# Patient Record
Sex: Female | Born: 1935 | Race: Black or African American | Hispanic: No | Smoking: Never smoker
Health system: Southern US, Community
[De-identification: ages and names within clinical notes are randomized; demographics above are authoritative.]

## PROBLEM LIST (undated history)

## (undated) DIAGNOSIS — I4821 Permanent atrial fibrillation: Secondary | ICD-10-CM

## (undated) DIAGNOSIS — M069 Rheumatoid arthritis, unspecified: Secondary | ICD-10-CM

## (undated) DIAGNOSIS — D62 Acute posthemorrhagic anemia: Secondary | ICD-10-CM

## (undated) DIAGNOSIS — I272 Pulmonary hypertension, unspecified: Secondary | ICD-10-CM

## (undated) DIAGNOSIS — I10 Essential (primary) hypertension: Secondary | ICD-10-CM

## (undated) DIAGNOSIS — E785 Hyperlipidemia, unspecified: Secondary | ICD-10-CM

## (undated) DIAGNOSIS — I5032 Chronic diastolic (congestive) heart failure: Secondary | ICD-10-CM

## (undated) HISTORY — DX: Essential (primary) hypertension: I10

## (undated) HISTORY — DX: Acute posthemorrhagic anemia: D62

## (undated) HISTORY — DX: Pulmonary hypertension, unspecified: I27.20

## (undated) HISTORY — DX: Permanent atrial fibrillation: I48.21

## (undated) HISTORY — DX: Chronic diastolic (congestive) heart failure: I50.32

## (undated) HISTORY — PX: NO PAST SURGERIES: SHX2092

## (undated) HISTORY — DX: Hyperlipidemia, unspecified: E78.5

---

## 1997-06-08 ENCOUNTER — Encounter: Admission: RE | Admit: 1997-06-08 | Discharge: 1997-06-08 | Payer: Self-pay | Admitting: Family Medicine

## 1997-07-06 ENCOUNTER — Encounter: Admission: RE | Admit: 1997-07-06 | Discharge: 1997-07-06 | Payer: Self-pay | Admitting: Family Medicine

## 1997-07-25 ENCOUNTER — Encounter: Admission: RE | Admit: 1997-07-25 | Discharge: 1997-07-25 | Payer: Self-pay | Admitting: Family Medicine

## 1997-08-17 ENCOUNTER — Encounter: Admission: RE | Admit: 1997-08-17 | Discharge: 1997-08-17 | Payer: Self-pay | Admitting: Family Medicine

## 1997-09-21 ENCOUNTER — Encounter: Admission: RE | Admit: 1997-09-21 | Discharge: 1997-09-21 | Payer: Self-pay | Admitting: Family Medicine

## 1997-10-26 ENCOUNTER — Encounter: Admission: RE | Admit: 1997-10-26 | Discharge: 1997-10-26 | Payer: Self-pay | Admitting: Family Medicine

## 1997-12-30 ENCOUNTER — Encounter: Admission: RE | Admit: 1997-12-30 | Discharge: 1997-12-30 | Payer: Self-pay | Admitting: Family Medicine

## 1998-01-02 ENCOUNTER — Encounter: Admission: RE | Admit: 1998-01-02 | Discharge: 1998-01-02 | Payer: Self-pay | Admitting: Family Medicine

## 1998-01-02 ENCOUNTER — Ambulatory Visit (HOSPITAL_COMMUNITY): Admission: RE | Admit: 1998-01-02 | Discharge: 1998-01-02 | Payer: Self-pay

## 1998-01-05 ENCOUNTER — Encounter: Admission: RE | Admit: 1998-01-05 | Discharge: 1998-01-05 | Payer: Self-pay | Admitting: Family Medicine

## 1998-01-11 ENCOUNTER — Encounter: Admission: RE | Admit: 1998-01-11 | Discharge: 1998-01-11 | Payer: Self-pay | Admitting: Family Medicine

## 1998-01-18 ENCOUNTER — Encounter: Admission: RE | Admit: 1998-01-18 | Discharge: 1998-01-18 | Payer: Self-pay | Admitting: Family Medicine

## 1998-01-25 ENCOUNTER — Encounter: Admission: RE | Admit: 1998-01-25 | Discharge: 1998-01-25 | Payer: Self-pay | Admitting: Family Medicine

## 1998-02-02 ENCOUNTER — Encounter: Admission: RE | Admit: 1998-02-02 | Discharge: 1998-02-02 | Payer: Self-pay | Admitting: Family Medicine

## 1998-02-08 ENCOUNTER — Encounter: Admission: RE | Admit: 1998-02-08 | Discharge: 1998-02-08 | Payer: Self-pay | Admitting: Sports Medicine

## 1998-02-24 ENCOUNTER — Encounter: Admission: RE | Admit: 1998-02-24 | Discharge: 1998-02-24 | Payer: Self-pay | Admitting: Sports Medicine

## 1998-03-01 ENCOUNTER — Encounter: Admission: RE | Admit: 1998-03-01 | Discharge: 1998-03-01 | Payer: Self-pay | Admitting: Family Medicine

## 1998-03-10 ENCOUNTER — Encounter: Admission: RE | Admit: 1998-03-10 | Discharge: 1998-03-10 | Payer: Self-pay | Admitting: Family Medicine

## 1998-03-24 ENCOUNTER — Encounter: Admission: RE | Admit: 1998-03-24 | Discharge: 1998-03-24 | Payer: Self-pay | Admitting: Family Medicine

## 1998-04-07 ENCOUNTER — Encounter: Admission: RE | Admit: 1998-04-07 | Discharge: 1998-04-07 | Payer: Self-pay | Admitting: Family Medicine

## 1998-04-19 ENCOUNTER — Encounter: Admission: RE | Admit: 1998-04-19 | Discharge: 1998-04-19 | Payer: Self-pay | Admitting: Family Medicine

## 1998-05-02 ENCOUNTER — Encounter: Admission: RE | Admit: 1998-05-02 | Discharge: 1998-05-02 | Payer: Self-pay | Admitting: Sports Medicine

## 1998-05-17 ENCOUNTER — Encounter: Admission: RE | Admit: 1998-05-17 | Discharge: 1998-05-17 | Payer: Self-pay | Admitting: Family Medicine

## 1998-06-08 ENCOUNTER — Encounter: Admission: RE | Admit: 1998-06-08 | Discharge: 1998-06-08 | Payer: Self-pay | Admitting: Family Medicine

## 1998-06-14 ENCOUNTER — Encounter: Admission: RE | Admit: 1998-06-14 | Discharge: 1998-06-14 | Payer: Self-pay | Admitting: Family Medicine

## 1998-06-26 ENCOUNTER — Encounter: Admission: RE | Admit: 1998-06-26 | Discharge: 1998-06-26 | Payer: Self-pay | Admitting: Family Medicine

## 1998-07-03 ENCOUNTER — Encounter: Admission: RE | Admit: 1998-07-03 | Discharge: 1998-07-03 | Payer: Self-pay | Admitting: Family Medicine

## 1998-09-04 ENCOUNTER — Encounter: Admission: RE | Admit: 1998-09-04 | Discharge: 1998-09-04 | Payer: Self-pay | Admitting: Family Medicine

## 1998-09-15 ENCOUNTER — Encounter: Admission: RE | Admit: 1998-09-15 | Discharge: 1998-09-15 | Payer: Self-pay | Admitting: Family Medicine

## 1998-10-24 ENCOUNTER — Encounter: Admission: RE | Admit: 1998-10-24 | Discharge: 1998-10-24 | Payer: Self-pay | Admitting: Sports Medicine

## 1999-02-06 ENCOUNTER — Encounter: Admission: RE | Admit: 1999-02-06 | Discharge: 1999-02-06 | Payer: Self-pay | Admitting: Sports Medicine

## 1999-03-08 ENCOUNTER — Encounter: Admission: RE | Admit: 1999-03-08 | Discharge: 1999-03-08 | Payer: Self-pay | Admitting: Family Medicine

## 1999-04-04 ENCOUNTER — Encounter: Admission: RE | Admit: 1999-04-04 | Discharge: 1999-04-04 | Payer: Self-pay | Admitting: Family Medicine

## 1999-08-28 ENCOUNTER — Encounter: Admission: RE | Admit: 1999-08-28 | Discharge: 1999-08-28 | Payer: Self-pay | Admitting: Sports Medicine

## 1999-09-13 ENCOUNTER — Other Ambulatory Visit: Admission: RE | Admit: 1999-09-13 | Discharge: 1999-09-13 | Payer: Self-pay | Admitting: Sports Medicine

## 2000-01-15 ENCOUNTER — Encounter: Admission: RE | Admit: 2000-01-15 | Discharge: 2000-01-15 | Payer: Self-pay | Admitting: Family Medicine

## 2000-02-06 ENCOUNTER — Encounter: Admission: RE | Admit: 2000-02-06 | Discharge: 2000-02-06 | Payer: Self-pay | Admitting: Family Medicine

## 2000-04-10 ENCOUNTER — Inpatient Hospital Stay (HOSPITAL_COMMUNITY): Admission: EM | Admit: 2000-04-10 | Discharge: 2000-04-12 | Payer: Self-pay | Admitting: Emergency Medicine

## 2000-04-10 ENCOUNTER — Encounter: Payer: Self-pay | Admitting: Internal Medicine

## 2000-06-17 ENCOUNTER — Encounter: Admission: RE | Admit: 2000-06-17 | Discharge: 2000-06-17 | Payer: Self-pay | Admitting: Family Medicine

## 2000-09-08 ENCOUNTER — Encounter: Admission: RE | Admit: 2000-09-08 | Discharge: 2000-09-08 | Payer: Self-pay | Admitting: Family Medicine

## 2000-10-07 ENCOUNTER — Encounter: Admission: RE | Admit: 2000-10-07 | Discharge: 2000-10-07 | Payer: Self-pay | Admitting: Family Medicine

## 2001-02-26 ENCOUNTER — Encounter: Admission: RE | Admit: 2001-02-26 | Discharge: 2001-02-26 | Payer: Self-pay | Admitting: Family Medicine

## 2001-06-18 ENCOUNTER — Encounter (INDEPENDENT_AMBULATORY_CARE_PROVIDER_SITE_OTHER): Payer: Self-pay | Admitting: *Deleted

## 2001-06-22 ENCOUNTER — Encounter: Admission: RE | Admit: 2001-06-22 | Discharge: 2001-06-22 | Payer: Self-pay | Admitting: Family Medicine

## 2001-11-04 ENCOUNTER — Encounter: Admission: RE | Admit: 2001-11-04 | Discharge: 2001-11-04 | Payer: Self-pay | Admitting: Family Medicine

## 2001-12-07 ENCOUNTER — Encounter: Admission: RE | Admit: 2001-12-07 | Discharge: 2001-12-07 | Payer: Self-pay | Admitting: Family Medicine

## 2001-12-17 ENCOUNTER — Encounter: Admission: RE | Admit: 2001-12-17 | Discharge: 2001-12-17 | Payer: Self-pay | Admitting: Family Medicine

## 2002-01-11 ENCOUNTER — Encounter: Admission: RE | Admit: 2002-01-11 | Discharge: 2002-04-11 | Payer: Self-pay | Admitting: Family Medicine

## 2002-01-27 ENCOUNTER — Encounter: Payer: Self-pay | Admitting: Family Medicine

## 2002-01-27 ENCOUNTER — Ambulatory Visit (HOSPITAL_COMMUNITY): Admission: RE | Admit: 2002-01-27 | Discharge: 2002-01-27 | Payer: Self-pay | Admitting: Family Medicine

## 2002-02-27 ENCOUNTER — Emergency Department (HOSPITAL_COMMUNITY): Admission: EM | Admit: 2002-02-27 | Discharge: 2002-02-27 | Payer: Self-pay | Admitting: Emergency Medicine

## 2002-03-04 ENCOUNTER — Encounter: Admission: RE | Admit: 2002-03-04 | Discharge: 2002-03-04 | Payer: Self-pay | Admitting: Family Medicine

## 2002-05-21 ENCOUNTER — Encounter: Admission: RE | Admit: 2002-05-21 | Discharge: 2002-05-21 | Payer: Self-pay | Admitting: Family Medicine

## 2002-07-09 ENCOUNTER — Encounter: Admission: RE | Admit: 2002-07-09 | Discharge: 2002-07-09 | Payer: Self-pay | Admitting: Family Medicine

## 2002-09-06 ENCOUNTER — Encounter: Admission: RE | Admit: 2002-09-06 | Discharge: 2002-09-06 | Payer: Self-pay | Admitting: Sports Medicine

## 2003-01-05 ENCOUNTER — Encounter: Admission: RE | Admit: 2003-01-05 | Discharge: 2003-01-05 | Payer: Self-pay | Admitting: Family Medicine

## 2003-01-12 ENCOUNTER — Encounter: Admission: RE | Admit: 2003-01-12 | Discharge: 2003-01-12 | Payer: Self-pay | Admitting: Family Medicine

## 2003-04-28 ENCOUNTER — Encounter: Admission: RE | Admit: 2003-04-28 | Discharge: 2003-04-28 | Payer: Self-pay | Admitting: Family Medicine

## 2003-05-02 ENCOUNTER — Encounter: Admission: RE | Admit: 2003-05-02 | Discharge: 2003-05-02 | Payer: Self-pay | Admitting: Sports Medicine

## 2003-07-01 ENCOUNTER — Encounter: Admission: RE | Admit: 2003-07-01 | Discharge: 2003-07-01 | Payer: Self-pay | Admitting: Family Medicine

## 2003-11-18 ENCOUNTER — Ambulatory Visit: Payer: Self-pay | Admitting: Family Medicine

## 2003-12-21 ENCOUNTER — Ambulatory Visit: Payer: Self-pay | Admitting: Cardiology

## 2004-01-18 ENCOUNTER — Ambulatory Visit: Payer: Self-pay | Admitting: Cardiology

## 2004-01-24 ENCOUNTER — Ambulatory Visit: Payer: Self-pay | Admitting: Family Medicine

## 2004-02-15 ENCOUNTER — Ambulatory Visit: Payer: Self-pay | Admitting: Internal Medicine

## 2004-03-14 ENCOUNTER — Ambulatory Visit: Payer: Self-pay | Admitting: Cardiology

## 2004-03-15 ENCOUNTER — Ambulatory Visit: Payer: Self-pay | Admitting: Sports Medicine

## 2004-04-04 ENCOUNTER — Ambulatory Visit: Payer: Self-pay | Admitting: Cardiovascular Disease

## 2004-04-10 ENCOUNTER — Ambulatory Visit: Payer: Self-pay | Admitting: Family Medicine

## 2004-04-25 ENCOUNTER — Ambulatory Visit: Payer: Self-pay | Admitting: Cardiovascular Disease

## 2004-05-02 ENCOUNTER — Ambulatory Visit: Payer: Self-pay | Admitting: Family Medicine

## 2004-05-16 ENCOUNTER — Ambulatory Visit: Payer: Self-pay | Admitting: Cardiology

## 2004-05-16 ENCOUNTER — Ambulatory Visit: Payer: Self-pay | Admitting: *Deleted

## 2004-05-25 ENCOUNTER — Ambulatory Visit: Payer: Self-pay | Admitting: Cardiology

## 2004-05-25 ENCOUNTER — Ambulatory Visit: Payer: Self-pay

## 2004-06-06 ENCOUNTER — Ambulatory Visit: Payer: Self-pay | Admitting: Internal Medicine

## 2004-07-04 ENCOUNTER — Ambulatory Visit: Payer: Self-pay | Admitting: Cardiovascular Disease

## 2004-08-01 ENCOUNTER — Ambulatory Visit: Payer: Self-pay | Admitting: *Deleted

## 2004-08-15 ENCOUNTER — Ambulatory Visit: Payer: Self-pay | Admitting: Cardiology

## 2004-09-04 ENCOUNTER — Ambulatory Visit: Payer: Self-pay | Admitting: Family Medicine

## 2004-09-05 ENCOUNTER — Ambulatory Visit: Payer: Self-pay | Admitting: Cardiovascular Disease

## 2004-09-21 ENCOUNTER — Encounter: Admission: RE | Admit: 2004-09-21 | Discharge: 2004-09-21 | Payer: Self-pay | Admitting: Family Medicine

## 2004-10-01 ENCOUNTER — Encounter: Admission: RE | Admit: 2004-10-01 | Discharge: 2004-10-01 | Payer: Self-pay | Admitting: Family Medicine

## 2004-10-03 ENCOUNTER — Ambulatory Visit: Payer: Self-pay | Admitting: Cardiology

## 2004-10-05 ENCOUNTER — Ambulatory Visit: Payer: Self-pay | Admitting: Family Medicine

## 2004-10-24 ENCOUNTER — Ambulatory Visit: Payer: Self-pay | Admitting: Cardiovascular Disease

## 2004-10-26 ENCOUNTER — Ambulatory Visit: Payer: Self-pay | Admitting: Family Medicine

## 2004-11-21 ENCOUNTER — Ambulatory Visit: Payer: Self-pay | Admitting: Cardiology

## 2004-12-19 ENCOUNTER — Ambulatory Visit: Payer: Self-pay | Admitting: Cardiology

## 2005-01-16 ENCOUNTER — Ambulatory Visit: Payer: Self-pay | Admitting: Cardiology

## 2005-01-25 ENCOUNTER — Ambulatory Visit: Payer: Self-pay | Admitting: Family Medicine

## 2005-02-06 ENCOUNTER — Ambulatory Visit: Payer: Self-pay | Admitting: Cardiology

## 2005-03-06 ENCOUNTER — Ambulatory Visit: Payer: Self-pay | Admitting: Cardiovascular Disease

## 2005-04-03 ENCOUNTER — Ambulatory Visit: Payer: Self-pay | Admitting: Cardiology

## 2005-04-03 ENCOUNTER — Ambulatory Visit: Payer: Self-pay | Admitting: Family Medicine

## 2005-05-01 ENCOUNTER — Ambulatory Visit: Payer: Self-pay | Admitting: Cardiology

## 2005-05-29 ENCOUNTER — Ambulatory Visit: Payer: Self-pay | Admitting: Cardiology

## 2005-06-26 ENCOUNTER — Ambulatory Visit: Payer: Self-pay | Admitting: Internal Medicine

## 2005-07-24 ENCOUNTER — Ambulatory Visit: Payer: Self-pay | Admitting: Cardiology

## 2005-08-06 ENCOUNTER — Ambulatory Visit: Payer: Self-pay | Admitting: Sports Medicine

## 2005-08-28 ENCOUNTER — Ambulatory Visit: Payer: Self-pay | Admitting: Cardiovascular Disease

## 2005-09-25 ENCOUNTER — Ambulatory Visit: Payer: Self-pay | Admitting: Cardiology

## 2005-10-23 ENCOUNTER — Ambulatory Visit: Payer: Self-pay | Admitting: Cardiology

## 2005-11-20 ENCOUNTER — Ambulatory Visit: Payer: Self-pay | Admitting: Cardiology

## 2005-12-18 ENCOUNTER — Ambulatory Visit: Payer: Self-pay | Admitting: Cardiovascular Disease

## 2006-01-15 ENCOUNTER — Ambulatory Visit: Payer: Self-pay | Admitting: Cardiovascular Disease

## 2006-01-30 ENCOUNTER — Ambulatory Visit: Payer: Self-pay | Admitting: Family Medicine

## 2006-02-24 ENCOUNTER — Ambulatory Visit: Payer: Self-pay | Admitting: Cardiology

## 2006-03-19 ENCOUNTER — Encounter: Admission: RE | Admit: 2006-03-19 | Discharge: 2006-03-19 | Payer: Self-pay | Admitting: Sports Medicine

## 2006-03-26 ENCOUNTER — Ambulatory Visit: Payer: Self-pay | Admitting: Cardiology

## 2006-04-15 ENCOUNTER — Ambulatory Visit: Payer: Self-pay | Admitting: Family Medicine

## 2006-04-17 DIAGNOSIS — M1710 Unilateral primary osteoarthritis, unspecified knee: Secondary | ICD-10-CM

## 2006-04-17 DIAGNOSIS — E669 Obesity, unspecified: Secondary | ICD-10-CM

## 2006-04-17 DIAGNOSIS — I4821 Permanent atrial fibrillation: Secondary | ICD-10-CM | POA: Insufficient documentation

## 2006-04-17 DIAGNOSIS — E785 Hyperlipidemia, unspecified: Secondary | ICD-10-CM

## 2006-04-17 DIAGNOSIS — IMO0002 Reserved for concepts with insufficient information to code with codable children: Secondary | ICD-10-CM | POA: Insufficient documentation

## 2006-04-18 ENCOUNTER — Encounter (INDEPENDENT_AMBULATORY_CARE_PROVIDER_SITE_OTHER): Payer: Self-pay | Admitting: *Deleted

## 2006-04-23 ENCOUNTER — Ambulatory Visit: Payer: Self-pay | Admitting: Cardiology

## 2006-05-28 ENCOUNTER — Ambulatory Visit: Payer: Self-pay | Admitting: Cardiology

## 2006-06-18 ENCOUNTER — Ambulatory Visit: Payer: Self-pay | Admitting: Cardiology

## 2006-07-02 ENCOUNTER — Ambulatory Visit: Payer: Self-pay | Admitting: Cardiovascular Disease

## 2006-07-30 ENCOUNTER — Ambulatory Visit: Payer: Self-pay | Admitting: Cardiovascular Disease

## 2006-08-19 ENCOUNTER — Encounter (INDEPENDENT_AMBULATORY_CARE_PROVIDER_SITE_OTHER): Payer: Self-pay | Admitting: Family Medicine

## 2006-08-19 ENCOUNTER — Ambulatory Visit: Payer: Self-pay | Admitting: Family Medicine

## 2006-08-19 LAB — CONVERTED CEMR LAB
ALT: 22 U/L
AST: 22 U/L
Albumin: 3.7 g/dL
Alkaline Phosphatase: 84 U/L
BUN: 25 mg/dL — ABNORMAL HIGH
CO2: 21 meq/L
Calcium: 9 mg/dL
Chloride: 110 meq/L
Creatinine, Ser: 0.86 mg/dL
Glucose, Bld: 130 mg/dL — ABNORMAL HIGH
Hemoglobin: 13.4 g/dL
Potassium: 3.6 meq/L
RBC: 4.31 M/uL
Sodium: 142 meq/L
TSH: 1.669 u[IU]/mL
Total Bilirubin: 0.7 mg/dL
Total Protein: 6.8 g/dL
WBC: 8.5 10*3/uL

## 2006-08-27 ENCOUNTER — Ambulatory Visit: Payer: Self-pay | Admitting: Cardiology

## 2006-09-24 ENCOUNTER — Ambulatory Visit: Payer: Self-pay | Admitting: Cardiology

## 2006-10-22 ENCOUNTER — Ambulatory Visit: Payer: Self-pay | Admitting: Cardiovascular Disease

## 2006-11-26 ENCOUNTER — Ambulatory Visit: Payer: Self-pay | Admitting: Cardiology

## 2006-12-17 ENCOUNTER — Ambulatory Visit: Payer: Self-pay | Admitting: Family Medicine

## 2006-12-17 ENCOUNTER — Encounter (INDEPENDENT_AMBULATORY_CARE_PROVIDER_SITE_OTHER): Payer: Self-pay | Admitting: Family Medicine

## 2006-12-17 DIAGNOSIS — I10 Essential (primary) hypertension: Secondary | ICD-10-CM

## 2006-12-17 LAB — CONVERTED CEMR LAB
BUN: 28 mg/dL — ABNORMAL HIGH (ref 6–23)
Chloride: 108 meq/L (ref 96–112)
Creatinine, Ser: 0.96 mg/dL (ref 0.40–1.20)
Direct LDL: 86 mg/dL
Glucose, Bld: 111 mg/dL — ABNORMAL HIGH (ref 70–99)
Potassium: 3.9 meq/L (ref 3.5–5.3)

## 2006-12-25 ENCOUNTER — Ambulatory Visit: Payer: Self-pay | Admitting: Cardiology

## 2007-01-22 ENCOUNTER — Ambulatory Visit: Payer: Self-pay | Admitting: Cardiology

## 2007-02-09 ENCOUNTER — Ambulatory Visit: Payer: Self-pay | Admitting: Cardiology

## 2007-02-20 ENCOUNTER — Telehealth (INDEPENDENT_AMBULATORY_CARE_PROVIDER_SITE_OTHER): Payer: Self-pay | Admitting: *Deleted

## 2007-03-11 ENCOUNTER — Ambulatory Visit: Payer: Self-pay | Admitting: Cardiology

## 2007-03-11 ENCOUNTER — Ambulatory Visit: Payer: Self-pay | Admitting: Cardiovascular Disease

## 2007-03-16 ENCOUNTER — Ambulatory Visit: Payer: Self-pay

## 2007-03-18 ENCOUNTER — Telehealth (INDEPENDENT_AMBULATORY_CARE_PROVIDER_SITE_OTHER): Payer: Self-pay | Admitting: Family Medicine

## 2007-03-25 ENCOUNTER — Encounter: Payer: Self-pay | Admitting: Cardiology

## 2007-03-25 ENCOUNTER — Ambulatory Visit: Payer: Self-pay

## 2007-03-25 ENCOUNTER — Ambulatory Visit: Payer: Self-pay | Admitting: Cardiology

## 2007-04-15 ENCOUNTER — Ambulatory Visit: Payer: Self-pay | Admitting: Cardiology

## 2007-05-13 ENCOUNTER — Ambulatory Visit: Payer: Self-pay | Admitting: Cardiology

## 2007-06-10 ENCOUNTER — Ambulatory Visit: Payer: Self-pay | Admitting: Cardiology

## 2007-07-08 ENCOUNTER — Ambulatory Visit: Payer: Self-pay | Admitting: Cardiovascular Disease

## 2007-07-29 ENCOUNTER — Ambulatory Visit: Payer: Self-pay | Admitting: Cardiology

## 2007-08-13 ENCOUNTER — Encounter (INDEPENDENT_AMBULATORY_CARE_PROVIDER_SITE_OTHER): Payer: Self-pay | Admitting: Family Medicine

## 2007-08-13 ENCOUNTER — Ambulatory Visit: Payer: Self-pay | Admitting: Family Medicine

## 2007-08-13 LAB — CONVERTED CEMR LAB
Calcium: 9.2 mg/dL (ref 8.4–10.5)
Creatinine, Ser: 1.25 mg/dL — ABNORMAL HIGH (ref 0.40–1.20)
HCT: 42.3 % (ref 36.0–46.0)
Hemoglobin: 13.7 g/dL (ref 12.0–15.0)
Platelets: 167 10*3/uL (ref 150–400)
RBC: 4.4 M/uL (ref 3.87–5.11)
WBC: 6.8 10*3/uL (ref 4.0–10.5)

## 2007-08-14 ENCOUNTER — Telehealth: Payer: Self-pay | Admitting: *Deleted

## 2007-08-26 ENCOUNTER — Ambulatory Visit: Payer: Self-pay | Admitting: Cardiovascular Disease

## 2007-08-27 ENCOUNTER — Telehealth (INDEPENDENT_AMBULATORY_CARE_PROVIDER_SITE_OTHER): Payer: Self-pay | Admitting: *Deleted

## 2007-09-23 ENCOUNTER — Ambulatory Visit: Payer: Self-pay | Admitting: Internal Medicine

## 2007-10-21 ENCOUNTER — Ambulatory Visit: Payer: Self-pay | Admitting: Cardiology

## 2007-11-04 ENCOUNTER — Encounter: Payer: Self-pay | Admitting: Sports Medicine

## 2007-11-11 ENCOUNTER — Ambulatory Visit: Payer: Self-pay | Admitting: Cardiology

## 2007-12-09 ENCOUNTER — Ambulatory Visit: Payer: Self-pay | Admitting: Cardiology

## 2007-12-23 ENCOUNTER — Ambulatory Visit: Payer: Self-pay | Admitting: Cardiology

## 2008-01-08 ENCOUNTER — Ambulatory Visit: Payer: Self-pay | Admitting: Family Medicine

## 2008-01-13 ENCOUNTER — Ambulatory Visit: Payer: Self-pay | Admitting: Cardiology

## 2008-01-20 ENCOUNTER — Encounter: Admission: RE | Admit: 2008-01-20 | Discharge: 2008-01-20 | Payer: Self-pay | Admitting: Sports Medicine

## 2008-02-03 ENCOUNTER — Ambulatory Visit: Payer: Self-pay | Admitting: Cardiovascular Disease

## 2008-02-15 ENCOUNTER — Telehealth: Payer: Self-pay | Admitting: Sports Medicine

## 2008-03-02 ENCOUNTER — Ambulatory Visit: Payer: Self-pay | Admitting: Cardiology

## 2008-03-23 ENCOUNTER — Ambulatory Visit: Payer: Self-pay | Admitting: Family Medicine

## 2008-03-30 ENCOUNTER — Ambulatory Visit: Payer: Self-pay | Admitting: Cardiology

## 2008-04-27 ENCOUNTER — Ambulatory Visit: Payer: Self-pay | Admitting: Cardiology

## 2008-05-09 ENCOUNTER — Encounter: Payer: Self-pay | Admitting: Family Medicine

## 2008-05-24 ENCOUNTER — Telehealth: Payer: Self-pay | Admitting: Family Medicine

## 2008-05-25 ENCOUNTER — Ambulatory Visit: Payer: Self-pay | Admitting: Internal Medicine

## 2008-06-02 ENCOUNTER — Ambulatory Visit: Payer: Self-pay | Admitting: Cardiology

## 2008-06-22 ENCOUNTER — Ambulatory Visit: Payer: Self-pay | Admitting: Cardiology

## 2008-06-27 ENCOUNTER — Encounter: Payer: Self-pay | Admitting: Family Medicine

## 2008-06-27 ENCOUNTER — Ambulatory Visit: Payer: Self-pay | Admitting: Family Medicine

## 2008-06-27 DIAGNOSIS — M109 Gout, unspecified: Secondary | ICD-10-CM | POA: Insufficient documentation

## 2008-06-27 LAB — CONVERTED CEMR LAB
CO2: 21 meq/L (ref 19–32)
Glucose, Bld: 114 mg/dL — ABNORMAL HIGH (ref 70–99)
Potassium: 4 meq/L (ref 3.5–5.3)

## 2008-06-28 ENCOUNTER — Encounter: Payer: Self-pay | Admitting: Family Medicine

## 2008-07-04 ENCOUNTER — Telehealth: Payer: Self-pay | Admitting: Family Medicine

## 2008-07-19 ENCOUNTER — Encounter: Payer: Self-pay | Admitting: *Deleted

## 2008-07-20 ENCOUNTER — Ambulatory Visit: Payer: Self-pay | Admitting: Cardiology

## 2008-07-20 LAB — CONVERTED CEMR LAB
POC INR: 2.2
Protime: 18.3

## 2008-07-26 ENCOUNTER — Telehealth: Payer: Self-pay | Admitting: *Deleted

## 2008-07-27 ENCOUNTER — Telehealth: Payer: Self-pay | Admitting: *Deleted

## 2008-08-17 ENCOUNTER — Ambulatory Visit: Payer: Self-pay | Admitting: Cardiology

## 2008-08-17 LAB — CONVERTED CEMR LAB: Prothrombin Time: 17.1 s

## 2008-08-24 ENCOUNTER — Encounter: Payer: Self-pay | Admitting: *Deleted

## 2008-09-12 ENCOUNTER — Ambulatory Visit: Payer: Self-pay | Admitting: Cardiovascular Disease

## 2008-09-12 ENCOUNTER — Encounter (INDEPENDENT_AMBULATORY_CARE_PROVIDER_SITE_OTHER): Payer: Self-pay | Admitting: Cardiology

## 2008-09-12 LAB — CONVERTED CEMR LAB
POC INR: 2.8
Prothrombin Time: 20.2 s

## 2008-09-20 ENCOUNTER — Encounter: Payer: Self-pay | Admitting: Cardiology

## 2008-10-05 ENCOUNTER — Ambulatory Visit: Payer: Self-pay | Admitting: Internal Medicine

## 2008-10-05 LAB — CONVERTED CEMR LAB: POC INR: 2.2

## 2008-11-02 ENCOUNTER — Ambulatory Visit: Payer: Self-pay | Admitting: Cardiology

## 2008-11-23 ENCOUNTER — Ambulatory Visit: Payer: Self-pay | Admitting: Cardiology

## 2008-11-30 ENCOUNTER — Ambulatory Visit: Payer: Self-pay | Admitting: Family Medicine

## 2008-11-30 ENCOUNTER — Encounter: Payer: Self-pay | Admitting: Family Medicine

## 2008-11-30 DIAGNOSIS — D179 Benign lipomatous neoplasm, unspecified: Secondary | ICD-10-CM | POA: Insufficient documentation

## 2008-11-30 DIAGNOSIS — R809 Proteinuria, unspecified: Secondary | ICD-10-CM | POA: Insufficient documentation

## 2008-11-30 LAB — CONVERTED CEMR LAB
ALT: 31 units/L (ref 0–35)
AST: 33 units/L (ref 0–37)
Albumin: 3.8 g/dL (ref 3.5–5.2)
CO2: 20 meq/L (ref 19–32)
Calcium: 8.9 mg/dL (ref 8.4–10.5)
Chloride: 107 meq/L (ref 96–112)
Cholesterol: 136 mg/dL (ref 0–200)
Glucose, Urine, Semiquant: NEGATIVE
Ketones, urine, test strip: NEGATIVE
LDL Cholesterol: 78 mg/dL (ref 0–99)
Protein, U semiquant: 100
Triglycerides: 64 mg/dL (ref <150)
pH: 7

## 2008-12-01 ENCOUNTER — Encounter: Payer: Self-pay | Admitting: Family Medicine

## 2008-12-01 ENCOUNTER — Telehealth: Payer: Self-pay | Admitting: Family Medicine

## 2008-12-14 ENCOUNTER — Telehealth: Payer: Self-pay | Admitting: *Deleted

## 2008-12-21 ENCOUNTER — Ambulatory Visit: Payer: Self-pay | Admitting: Cardiovascular Disease

## 2008-12-21 LAB — CONVERTED CEMR LAB: POC INR: 2.3

## 2009-01-18 ENCOUNTER — Ambulatory Visit: Payer: Self-pay | Admitting: Cardiology

## 2009-01-18 ENCOUNTER — Encounter (INDEPENDENT_AMBULATORY_CARE_PROVIDER_SITE_OTHER): Payer: Self-pay | Admitting: Cardiology

## 2009-02-08 ENCOUNTER — Ambulatory Visit: Payer: Self-pay | Admitting: Cardiology

## 2009-02-08 ENCOUNTER — Encounter (INDEPENDENT_AMBULATORY_CARE_PROVIDER_SITE_OTHER): Payer: Self-pay | Admitting: Cardiology

## 2009-03-08 ENCOUNTER — Ambulatory Visit: Payer: Self-pay | Admitting: Cardiology

## 2009-03-29 ENCOUNTER — Ambulatory Visit: Payer: Self-pay | Admitting: Cardiovascular Disease

## 2009-04-26 ENCOUNTER — Ambulatory Visit: Payer: Self-pay | Admitting: Internal Medicine

## 2009-04-26 LAB — CONVERTED CEMR LAB: POC INR: 1.5

## 2009-05-10 ENCOUNTER — Ambulatory Visit: Payer: Self-pay | Admitting: Internal Medicine

## 2009-05-10 LAB — CONVERTED CEMR LAB: POC INR: 2.2

## 2009-05-31 ENCOUNTER — Encounter: Payer: Self-pay | Admitting: Family Medicine

## 2009-05-31 ENCOUNTER — Ambulatory Visit: Payer: Self-pay | Admitting: Cardiovascular Disease

## 2009-05-31 ENCOUNTER — Ambulatory Visit: Payer: Self-pay | Admitting: Family Medicine

## 2009-05-31 LAB — CONVERTED CEMR LAB
Chloride: 108 meq/L (ref 96–112)
Creatinine, Ser: 0.98 mg/dL (ref 0.40–1.20)
Glucose, Bld: 109 mg/dL — ABNORMAL HIGH (ref 70–99)
POC INR: 2.2
Sodium: 140 meq/L (ref 135–145)

## 2009-06-01 ENCOUNTER — Encounter: Payer: Self-pay | Admitting: Family Medicine

## 2009-06-08 ENCOUNTER — Encounter: Payer: Self-pay | Admitting: Family Medicine

## 2009-06-19 ENCOUNTER — Encounter: Payer: Self-pay | Admitting: Family Medicine

## 2009-06-28 ENCOUNTER — Ambulatory Visit: Payer: Self-pay | Admitting: Cardiology

## 2009-06-28 LAB — CONVERTED CEMR LAB: POC INR: 1.7

## 2009-07-26 ENCOUNTER — Ambulatory Visit: Payer: Self-pay | Admitting: Cardiovascular Disease

## 2009-08-09 ENCOUNTER — Ambulatory Visit: Payer: Self-pay | Admitting: Cardiology

## 2009-08-23 ENCOUNTER — Ambulatory Visit: Payer: Self-pay | Admitting: Cardiology

## 2009-08-23 LAB — CONVERTED CEMR LAB: POC INR: 2

## 2009-09-13 ENCOUNTER — Ambulatory Visit: Payer: Self-pay | Admitting: Cardiology

## 2009-10-07 ENCOUNTER — Encounter: Payer: Self-pay | Admitting: Family Medicine

## 2009-10-11 ENCOUNTER — Ambulatory Visit: Payer: Self-pay | Admitting: Cardiology

## 2009-10-27 ENCOUNTER — Ambulatory Visit: Payer: Self-pay | Admitting: Family Medicine

## 2009-11-08 ENCOUNTER — Ambulatory Visit: Payer: Self-pay | Admitting: Cardiology

## 2009-11-08 LAB — CONVERTED CEMR LAB: POC INR: 2.4

## 2009-12-06 ENCOUNTER — Ambulatory Visit: Payer: Self-pay | Admitting: Cardiology

## 2009-12-14 ENCOUNTER — Telehealth: Payer: Self-pay | Admitting: Family Medicine

## 2009-12-29 ENCOUNTER — Ambulatory Visit: Payer: Self-pay | Admitting: Family Medicine

## 2010-01-03 ENCOUNTER — Ambulatory Visit: Payer: Self-pay | Admitting: Cardiovascular Disease

## 2010-01-31 ENCOUNTER — Ambulatory Visit: Payer: Self-pay | Admitting: Cardiology

## 2010-01-31 LAB — CONVERTED CEMR LAB: POC INR: 3

## 2010-02-22 ENCOUNTER — Telehealth: Payer: Self-pay | Admitting: Family Medicine

## 2010-02-28 ENCOUNTER — Ambulatory Visit: Admission: RE | Admit: 2010-02-28 | Discharge: 2010-02-28 | Payer: Self-pay | Source: Home / Self Care

## 2010-02-28 LAB — CONVERTED CEMR LAB: POC INR: 2.3

## 2010-03-11 ENCOUNTER — Encounter: Payer: Self-pay | Admitting: Family Medicine

## 2010-03-11 ENCOUNTER — Encounter: Payer: Self-pay | Admitting: Sports Medicine

## 2010-03-14 ENCOUNTER — Encounter (INDEPENDENT_AMBULATORY_CARE_PROVIDER_SITE_OTHER): Payer: Self-pay | Admitting: *Deleted

## 2010-03-22 NOTE — Miscellaneous (Signed)
Summary: ROI  ROI   Imported By: Raymond Gurney 06/08/2009 12:13:30  _____________________________________________________________________  External Attachment:    Type:   Image     Comment:   External Document

## 2010-03-22 NOTE — Medication Information (Signed)
Summary: rov/ewj  Anticoagulant Therapy  Managed by: Porfirio Oar, PharmD Referring MD: Kirk Ruths MD PCP: Graciella Belton MD Supervising MD: Aundra Dubin MD, Reianna Batdorf Indication 1: Atrial Fibrillation (ICD-427.31) Lab Used: [facility]  Site: [facility] INR POC 2.8 INR RANGE 2 - 3  Dietary changes: no    Health status changes: no    Bleeding/hemorrhagic complications: no    Recent/future hospitalizations: no    Any changes in medication regimen? no    Recent/future dental: no  Any missed doses?: no       Is patient compliant with meds? yes       Allergies: No Known Drug Allergies  Anticoagulation Management History:      The patient is taking warfarin and comes in today for a routine follow up visit.  Positive risk factors for bleeding include an age of 56 years or older.  The bleeding index is 'intermediate risk'.  Positive CHADS2 values include History of HTN.  Negative CHADS2 values include Age > 73 years old.  The start date was 04/11/2000.  Anticoagulation responsible provider: Aundra Dubin MD, Alyssah Algeo.  INR POC: 2.8.  Cuvette Lot#: [id number].  Exp: 11/2010.    Anticoagulation Management Assessment/Plan:      The patient's current anticoagulation dose is Warfarin sodium 5 mg tabs: TAKE ONE TABLET BY MOUTH  DAILY AS DIRECTED BY COUMADIN CLINIC.  The target INR is 2 - 3.  The next INR is due 10/11/2009.  Anticoagulation instructions were given to patient.  Results were reviewed/authorized by Porfirio Oar, PharmD.  She was notified by Porfirio Oar PharmD.         Prior Anticoagulation Instructions: INR 2.0  Take 1.5 tablets today, then resume same dosage 1 tablet daily.  Recheck in 3 weeks.    Current Anticoagulation Instructions: INR 2.8  Continue same dose of 1 tablet every day.  Recheck in 4 weeks.

## 2010-03-22 NOTE — Assessment & Plan Note (Signed)
Summary: f/u,df   Vital Signs:  Patient profile:   75 year old female Height:      65 inches Weight:      202.7 pounds BMI:     33.85 Temp:     98.7 degrees F Pulse rate:   80 / minute BP sitting:   152 / 92  Vitals Entered By: Elige Radon RN (October 27, 2009 8:29 AM)  Primary Care Bernette Seeman:  Marlana Salvage MD  CC:  HTN, HL, obesity, and a fib.  History of Present Illness: HTN- BP better today, has been walking more, eating less.  taking meds as perscribed.  no HA, CP, SOB.  HL- as above.  losing weight, avoiding fried foods, exercise daily walking her greatgrandson to bus stop.  obesity- working to lose weight.  has lost 11 lbs in last year.    A fib-  following with [facility].  meds as perscribed,  no other symptoms or changes to meds.  Habits & Providers  Alcohol-Tobacco-Diet     Alcohol drinks/day: 0     Tobacco Status: never  Exercise-Depression-Behavior     Seat Belt Use: always  Current Medications (verified): 1)  Aspirin Low Strength 81 Mg Chew (Aspirin) .... Take 1 Tablet By Mouth Once A Day 2)  Doxazosin Mesylate 8 Mg  Tabs (Doxazosin Mesylate) .... One Tab By Mouth Daily 3)  Cardizem Cd 180 Mg  Cp24 (Diltiazem Hcl Coated Beads) .... One Tablet By Mouth Daily. 4)  Enalapril Maleate 20 Mg Tabs (Enalapril Maleate) .Marland Kitchen.. 1 Tab By Mouth Two Times A Day 5)  Metoprolol Tartrate 100 Mg Tabs (Metoprolol Tartrate) .... Take 2 Tablet By Mouth Twice A Day 6)  Warfarin Sodium 5 Mg Tabs (Warfarin Sodium) .... Take One Tablet By Mouth  Daily As Directed By Coumadin Clinic 7)  Hydralazine Hcl 25 Mg Tabs (Hydralazine Hcl) .Marland Kitchen.. 1 Tab By Mouth Two Times A Day For High Blood Pressure 8)  Tylenol Arthritis Pain 650 Mg Cr-Tabs (Acetaminophen) .... One Tab By Mouth Q4-6h As Needed Pain in Knee  Allergies (verified): No Known Drug Allergies  Past History:  Family History: Last updated: 04/17/2006 brother - CAD, CABG, MI at 25 y/o, father -- no htn, mother -- died when  child, unknown cause  Social History: Last updated: 10/27/2009 lives with 2 grandchildren, 1 daughter; separated jan 99 from husband 27yrs; high functioning, young for age.  greatgrandson is 73 yrs old  Risk Factors: Smoking Status: never (10/27/2009)  Social History: lives with 2 grandchildren, 1 daughter; separated jan 99 from husband 46yrs; high functioning, young for age.  greatgrandson is 73 yrs old  Review of Systems  The patient denies anorexia, fever, chest pain, syncope, dyspnea on exertion, abdominal pain, melena, hematochezia, and severe indigestion/heartburn.    Physical Exam  General:  Well-developed,well-nourished,in no acute distress; alert,appropriate and cooperative throughout examination Head:  Normocephalic and atraumatic without obvious abnormalities. No apparent alopecia or balding. Lungs:  Normal respiratory effort, chest expands symmetrically. Lungs are clear to auscultation, no crackles or wheezes. Heart:  Normal rate and irregular rhythm.a fib Abdomen:  Bowel sounds positive,abdomen soft and non-tender without masses, organomegaly or hernias noted. Msk:  No deformity or scoliosis noted of thoracic or lumbar spine.  normal gait.  normal ROM of knees, ankles, elbows shoulders Extremities:  no edema B lower ext    Impression & Recommendations:  Problem # 1:  HYPERTENSION, MALIGNANT ESSENTIAL (ICD-401.0) Assessment Improved BP much improved from last visit.  continues to take  meds.  no changes today. Her updated medication list for this problem includes:    Doxazosin Mesylate 8 Mg Tabs (Doxazosin mesylate) ..... One tab by mouth daily    Cardizem Cd 180 Mg Cp24 (Diltiazem hcl coated beads) ..... One tablet by mouth daily.    Enalapril Maleate 20 Mg Tabs (Enalapril maleate) .Marland Kitchen... 1 tab by mouth two times a day    Metoprolol Tartrate 100 Mg Tabs (Metoprolol tartrate) .Marland Kitchen... Take 2 tablet by mouth twice a day    Hydralazine Hcl 25 Mg Tabs (Hydralazine hcl) .Marland Kitchen... 1  tab by mouth two times a day for high blood pressure  Orders: [facility]- Est  Level 4 ([id number])Future Orders: Comp Met-[facility] [id number]) ... 10/26/2010  Problem # 2:  ATRIAL FIBRILLATION (ICD-427.31) Assessment: Unchanged still following with [facility].  they have not changed meds.  rate controlled.  without SOB or CP. Her updated medication list for this problem includes:    Aspirin Low Strength 81 Mg Chew (Aspirin) .Marland Kitchen... Take 1 tablet by mouth once a day    Cardizem Cd 180 Mg Cp24 (Diltiazem hcl coated beads) ..... One tablet by mouth daily.    Metoprolol Tartrate 100 Mg Tabs (Metoprolol tartrate) .Marland Kitchen... Take 2 tablet by mouth twice a day    Warfarin Sodium 5 Mg Tabs (Warfarin sodium) .Marland Kitchen... Take one tablet by mouth  daily as directed by coumadin clinic  Orders: [facility]- Est  Level 4 [id number])  Problem # 3:  HYPERCHOLESTEROLEMIA (ICD-272.0) Assessment: Unchanged will check in october since will be one year.  last year cholesterol was good. will expect good numbers considering weight loss. Orders: [facility]- Est  Level 4 ([id number])Future Orders: Lipid-[facility] [id number]) ... 10/25/2010  Problem # 4:  OBESITY, NOS (ICD-278.00) Assessment: Improved weight loss due to diet and exercise.  pt to continue Orders: [facility]- Est  Level 4 [id number])  Complete Medication List: 1)  Aspirin Low Strength 81 Mg Chew (Aspirin) .... Take 1 tablet by mouth once a day 2)  Doxazosin Mesylate 8 Mg Tabs (Doxazosin mesylate) .... One tab by mouth daily 3)  Cardizem Cd 180 Mg Cp24 (Diltiazem hcl coated beads) .... One tablet by mouth daily. 4)  Enalapril Maleate 20 Mg Tabs (Enalapril maleate) .Marland Kitchen.. 1 tab by mouth two times a day 5)  Metoprolol Tartrate 100 Mg Tabs (Metoprolol tartrate) .... Take 2 tablet by mouth twice a day 6)  Warfarin Sodium 5 Mg Tabs (Warfarin sodium) .... Take one tablet by mouth  daily as directed by coumadin clinic 7)  Hydralazine Hcl 25 Mg Tabs (Hydralazine hcl) .Marland Kitchen.. 1 tab by mouth two times a day for high blood  pressure 8)  Tylenol Arthritis Pain 650 Mg Cr-tabs (Acetaminophen) .... One tab by mouth q4-6h as needed pain in knee  Other Orders: Hemoccult Cards (Take Home) (Hemoccult Cards)  Patient Instructions: 1)  Please make a lab appt in 1 month to get your cholesterol checked.  please come without eating or drinking except water and coffee. 2)  please come back to see me in 3 months 3)  schedule your mammogram 4)  continue with diet and exercise.  great job! 5)  please use the stool cards and send them back.   Prevention & Chronic Care Immunizations   Influenza vaccine: Fluvax 3+  (11/30/2008)   Influenza vaccine deferral: Not available  (10/27/2009)   Influenza vaccine due: 12/17/2007    Tetanus booster: 03/23/2008: Td   Td booster deferral: Not indicated  (11/30/2008)   Tetanus booster due: 08/18/2008  Pneumococcal vaccine: Pneumovax  (11/30/2008)   Pneumococcal vaccine due: None    H. zoster vaccine: Not documented  Colorectal Screening   Hemoccult: Done.  (01/19/1999)   Hemoccult action/deferral: Ordered  (10/27/2009)   Hemoccult due: 01/19/2000    Colonoscopy: Not documented   Colonoscopy action/deferral: Refused  (10/27/2009)   Colonoscopy due: 11/01/2010  Other Screening   Pap smear: Done.  (06/18/2001)   Pap smear due: Not Indicated    Mammogram: Done.  (02/18/2006)   Mammogram action/deferral: Ordered  (11/30/2008)   Mammogram due: 02/19/2007    DXA bone density scan: Done.  (02/18/2001)   DXA bone density action/deferral: Not indicated  (10/27/2009)   DXA scan due: None     Smoking status: never  (10/27/2009)  Hypertension   Last Blood Pressure: 152 / 92  (10/27/2009)   Serum creatinine: 0.98  (05/31/2009)   Serum potassium 3.5  (05/31/2009)    Hypertension flowsheet reviewed?: Yes   Progress toward BP goal: Improved  Lipids   Total Cholesterol: 136  (11/30/2008)   LDL: 78  (11/30/2008)   LDL Direct: 86  (12/17/2006)   HDL: 45  (11/30/2008)    Triglycerides: 64  (11/30/2008)    SGOT (AST): 33  (11/30/2008)   SGPT (ALT): 31  (11/30/2008)   Alkaline phosphatase: 61  (11/30/2008)   Total bilirubin: 0.9  (11/30/2008)    Lipid flowsheet reviewed?: Yes   Progress toward LDL goal: Unchanged  Self-Management Support :   Personal Goals (by the next clinic visit) :      Personal blood pressure goal: 140/90  (10/27/2009)     Personal LDL goal: 100  (10/27/2009)    Patient will work on the following items until the next clinic visit to reach self-care goals:     Medications and monitoring: take my medicines every day  (10/27/2009)     Eating: eat more vegetables  (10/27/2009)     Activity: take a 30 minute walk every day  (10/27/2009)    Hypertension self-management support: Written self-care plan  (10/27/2009)   Hypertension self-care plan printed.    Lipid self-management support: Written self-care plan  (10/27/2009)   Lipid self-care plan printed.   Nursing Instructions: Provide Hemoccult cards with instructions (see order)

## 2010-03-22 NOTE — Assessment & Plan Note (Signed)
Summary: flu shot,df   Nurse Visit   Allergies: No Known Drug Allergies  Immunizations Administered:  Influenza Vaccine # 1:    Vaccine Type: Fluvax MCR    Site: right deltoid    Mfr: Sanofi Pasteur    Dose: 0.5 ml    Route: IM    Given by: Marcell Barlow RN    Exp. Date: 08/18/2010    Lot #: [id number]    VIS given: 09/12/09 version given December 29, 2009.  Flu Vaccine Consent Questions:    Do you have a history of severe allergic reactions to this vaccine? no    Any prior history of allergic reactions to egg and/or gelatin? no    Do you have a sensitivity to the preservative Thimersol? no    Do you have a past history of Guillan-Barre Syndrome? no    Do you currently have an acute febrile illness? no    Have you ever had a severe reaction to latex? no    Vaccine information given and explained to patient? yes    Are you currently pregnant? no  Orders Added: 1)  Influenza Vaccine MCR [00025] 2)  Administration Flu vaccine - MCR [id number]

## 2010-03-22 NOTE — Letter (Signed)
Summary: Results Follow-up Letter  [facility]  [address]   [place], [place] [address]   Phone: [phone]  Fax: [phone]    06/01/2009  [address] [place], [place]  [address]  Dear Ms. Bloor,   The following are the results of your recent test(s):  Kidney function -- normal Electrolytes -- normal Blood sugar -- 109  Sincerely, Pietro Cassis. Jeannine Kitten, MD  Appended Document: Results Follow-up Letter mailed

## 2010-03-22 NOTE — Medication Information (Signed)
Summary: rov/tm  Anticoagulant Therapy  Managed by: Tula Nakayama, RN, BSN Referring MD: Kirk Ruths MD PCP: Graciella Belton MD Supervising MD: Stanford Breed MD, Aaron Edelman Indication 1: Atrial Fibrillation (ICD-427.31) Lab Used: [facility] Site: [facility] INR POC 1.6 INR RANGE 2 - 3  Dietary changes: no    Health status changes: no    Bleeding/hemorrhagic complications: no    Recent/future hospitalizations: no    Any changes in medication regimen? no    Recent/future dental: no  Any missed doses?: no       Is patient compliant with meds? yes       Allergies: No Known Drug Allergies  Anticoagulation Management History:      The patient is taking warfarin and comes in today for a routine follow up visit.  Positive risk factors for bleeding include an age of 73 years or older.  The bleeding index is 'intermediate risk'.  Positive CHADS2 values include History of HTN.  Negative CHADS2 values include Age > 11 years old.  The start date was 04/11/2000.  Anticoagulation responsible provider: Stanford Breed MD, Aaron Edelman.  INR POC: 1.6.  Cuvette Lot#: [id number].  Exp: 10/2010.    Anticoagulation Management Assessment/Plan:      The patient's current anticoagulation dose is Warfarin sodium 5 mg tabs: TAKE ONE TABLET BY MOUTH  DAILY AS DIRECTED BY COUMADIN CLINIC.  The target INR is 2 - 3.  The next INR is due 08/23/2009.  Anticoagulation instructions were given to patient.  Results were reviewed/authorized by Tula Nakayama, RN, BSN.  She was notified by Tula Nakayama, RN, BSN.         Prior Anticoagulation Instructions: INR 1.5 Today Take 1 1/2 pills and Thursday then resume 1 pill everyday except 1/2 pill on Mondays. Recheck in 2 weeks.   Current Anticoagulation Instructions: INR 1.6 Today take 1 1/2 pills then change dose to 1 pill everyday. Recheck in 2 weeks.

## 2010-03-22 NOTE — Letter (Signed)
Summary: Generic Letter  [facility]  [address]   [place], [place] [address]   Phone: [phone]  Fax: [phone]    03/14/2010  [address] [place], [place]  [address]  Dear Ms. Cheramie,  We are happy to let you know that since you are covered under Medicare you are able to have a FREE visit at the [facility] to discuss your HEALTH. This is a new benefit for Medicare.  There will be no co-payment.  At this visit you will meet with Lamont Dowdy an expert in wellness and the health coach at our clinic.  At this visit we will discuss ways to keep you healthy and feeling well.  This visit will not replace your regular doctor visit and we cannot refill medications.     You will need to plan to be here at least one hour to talk about your medical history, your current status, review all of your medications, and discuss your future plans for your health.  This information will be entered into your record for your doctor to have and review.  If you are interested in staying healthy, this type of visit can help.  Please call the office at: [phone], to schedule a "Medicare Wellness Visit".  The day of the visit you should bring in all of your medications, including any vitamins, herbs, over the counter products you take.  Make a list of all the other doctors that you see, so we know who they are. If you have any other health documents please bring them.  We look forward to helping you stay healthy.  Sincerely,   Suzanne Lineberry [place]

## 2010-03-22 NOTE — Medication Information (Signed)
Summary: rov.mp  Anticoagulant Therapy  Managed by: Belenda Cruise, PharmD Candidate Referring MD: Kirk Ruths MD PCP: Aundria Mems MD Supervising MD: Ron Parker MD, Dellis Filbert Indication 1: Atrial Fibrillation (ICD-427.31) Lab Used: [place] Site: [facility] INR POC 1.8 INR RANGE 2 - 3  Dietary changes: no    Health status changes: no    Bleeding/hemorrhagic complications: no    Recent/future hospitalizations: no    Any changes in medication regimen? no    Recent/future dental: no  Any missed doses?: no       Is patient compliant with meds? yes       Allergies: No Known Drug Allergies  Anticoagulation Management History:      The patient is taking warfarin and comes in today for a routine follow up visit.  Positive risk factors for bleeding include an age of 73 years or older.  The bleeding index is 'intermediate risk'.  Positive CHADS2 values include History of HTN.  Negative CHADS2 values include Age > 73 years old.  The start date was 04/11/2000.  Anticoagulation responsible provider: Ron Parker MD, Dellis Filbert.  INR POC: 1.8.  Cuvette Lot#: [id number].  Exp: 05/2010.    Anticoagulation Management Assessment/Plan:      The patient's current anticoagulation dose is Warfarin sodium 5 mg tabs: TAKE ONE TABLET BY MOUTH  DAILY AS DIRECTED BY COUMADIN CLINIC.  The target INR is 2 - 3.  The next INR is due 03/29/2009.  Anticoagulation instructions were given to patient.  Results were reviewed/authorized by Belenda Cruise, PharmD Candidate.  She was notified by Belenda Cruise, PharmD Candidate.         Prior Anticoagulation Instructions: INR 2.1  Continue taking 1 tab daily except 0.5 tab each Monday and Friday.  Recheck in 4 weeks.     Current Anticoagulation Instructions: INR 1.8  Take 1.5 tablets today then resume 1 tablet daily except 0.5 tablets on Mondays and Fridays. Recheck in 3 weeks.

## 2010-03-22 NOTE — Medication Information (Signed)
Summary: Jennifer Hinton  Anticoagulant Therapy  Managed by: Freddrick March, RN, BSN Referring MD: Kirk Ruths MD PCP: Aundria Mems MD Supervising MD: Haroldine Laws MD, Quillian Quince Indication 1: Atrial Fibrillation (ICD-427.31) Lab Used: [facility] Site: [facility] INR POC 2.2 INR RANGE 2 - 3  Dietary changes: yes       Details: Decr amt of greens eating.   Health status changes: no    Bleeding/hemorrhagic complications: no    Recent/future hospitalizations: no    Any changes in medication regimen? no    Recent/future dental: no  Any missed doses?: no       Is patient compliant with meds? yes       Allergies (verified): No Known Drug Allergies  Anticoagulation Management History:      The patient is taking warfarin and comes in today for a routine follow up visit.  Positive risk factors for bleeding include an age of 26 years or older.  The bleeding index is 'intermediate risk'.  Positive CHADS2 values include History of HTN.  Negative CHADS2 values include Age > 86 years old.  The start date was 04/11/2000.  Anticoagulation responsible provider: Bensimhon MD, Quillian Quince.  INR POC: 2.2.  Cuvette Lot#: [id number].  Exp: 06/2010.    Anticoagulation Management Assessment/Plan:      The patient's current anticoagulation dose is Warfarin sodium 5 mg tabs: TAKE ONE TABLET BY MOUTH  DAILY AS DIRECTED BY COUMADIN CLINIC.  The target INR is 2 - 3.  The next INR is due 05/31/2009.  Anticoagulation instructions were given to patient.  Results were reviewed/authorized by Freddrick March, RN, BSN.  She was notified by Freddrick March RN.         Prior Anticoagulation Instructions: INR 1.5  Take 1.5 today, then start taking 1 tablet daily except 1/2 tablet on Mondays.  Recheck in 2 weeks.    Current Anticoagulation Instructions: INR 2.2  Continue on same dosage 1 tablet daily except 1/2 tablet on Mondays.  Recheck in 3 weeks.

## 2010-03-22 NOTE — Medication Information (Signed)
Summary: [facility] Clinic   Anticoagulant Therapy  Managed by: Porfirio Oar, PharmD Referring MD: Kirk Ruths MD PCP: Marlana Salvage MD Supervising MD: Johnsie Cancel MD, Collier Salina Indication 1: Atrial Fibrillation (ICD-427.31) Lab Used: [facility] Site: [facility] INR POC 3.1 INR RANGE 2 - 3  Dietary changes: no    Health status changes: no    Bleeding/hemorrhagic complications: no    Recent/future hospitalizations: no    Any changes in medication regimen? no    Recent/future dental: no  Any missed doses?: no       Is patient compliant with meds? yes       Allergies: No Known Drug Allergies  Anticoagulation Management History:      The patient is taking warfarin and comes in today for a routine follow up visit.  Positive risk factors for bleeding include an age of 73 years or older.  The bleeding index is 'intermediate risk'.  Positive CHADS2 values include History of HTN.  Negative CHADS2 values include Age > 31 years old.  The start date was 04/11/2000.  Her last INR was 2.4.  Anticoagulation responsible provider: Johnsie Cancel MD, Collier Salina.  INR POC: 3.1.  Cuvette Lot#: [id number].  Exp: 01/2011.    Anticoagulation Management Assessment/Plan:      The patient's current anticoagulation dose is Warfarin sodium 5 mg tabs: TAKE ONE TABLET BY MOUTH  DAILY AS DIRECTED BY COUMADIN CLINIC.  The target INR is 2 - 3.  The next INR is due 01/31/2010.  Anticoagulation instructions were given to patient.  Results were reviewed/authorized by Porfirio Oar, PharmD.  She was notified by Porfirio Oar PharmD.         Prior Anticoagulation Instructions: INR 2.4  Continue Coumadin as scheduled:  1 tablet every day of the week.    Return to clinic in 4 weeks.   Current Anticoagulation Instructions: INR 3.1  Continue same dose of 1 tablet every day.  Eat a serving of greens today.  Recheck INR in 4 weeks.

## 2010-03-22 NOTE — Medication Information (Signed)
Summary: Jennifer Hinton  Anticoagulant Therapy  Managed by: Porfirio Oar, PharmD Referring MD: Kirk Ruths MD PCP: Aundria Mems MD Supervising MD: Johnsie Cancel MD, Collier Salina Indication 1: Atrial Fibrillation (ICD-427.31) Lab Used: [facility] Site: [facility] INR POC 2.2 INR RANGE 2 - 3  Dietary changes: no    Health status changes: no    Bleeding/hemorrhagic complications: no    Recent/future hospitalizations: no    Any changes in medication regimen? no    Recent/future dental: no  Any missed doses?: no       Is patient compliant with meds? yes       Allergies: No Known Drug Allergies  Anticoagulation Management History:      The patient is taking warfarin and comes in today for a routine follow up visit.  Positive risk factors for bleeding include an age of 73 years or older.  The bleeding index is 'intermediate risk'.  Positive CHADS2 values include History of HTN.  Negative CHADS2 values include Age > 15 years old.  The start date was 04/11/2000.  Anticoagulation responsible provider: Johnsie Cancel MD, Collier Salina.  INR POC: 2.2.  Cuvette Lot#: [id number].  Exp: 06/2010.    Anticoagulation Management Assessment/Plan:      The patient's current anticoagulation dose is Warfarin sodium 5 mg tabs: TAKE ONE TABLET BY MOUTH  DAILY AS DIRECTED BY COUMADIN CLINIC.  The target INR is 2 - 3.  The next INR is due 06/28/2009.  Anticoagulation instructions were given to patient.  Results were reviewed/authorized by Porfirio Oar, PharmD.  She was notified by Porfirio Oar PharmD.         Prior Anticoagulation Instructions: INR 2.2  Continue on same dosage 1 tablet daily except 1/2 tablet on Mondays.  Recheck in 3 weeks.    Current Anticoagulation Instructions: INR 2.2  Continue same dose of 1 tablet every day except 1/2 tablet on Mondays

## 2010-03-22 NOTE — Medication Information (Signed)
Summary: rov/kh   Anticoagulant Therapy  Managed by: Porfirio Oar, PharmD Referring MD: Kirk Ruths MD PCP: Marlana Salvage MD Supervising MD: Mare Ferrari, MD Indication 1: Atrial Fibrillation (ICD-427.31) Lab Used: [facility] Site: [facility] INR POC 2.3 INR RANGE 2 - 3  Dietary changes: no    Health status changes: no    Bleeding/hemorrhagic complications: no    Recent/future hospitalizations: no    Any changes in medication regimen? no    Recent/future dental: no  Any missed doses?: no       Is patient compliant with meds? yes       Allergies: No Known Drug Allergies  Anticoagulation Management History:      The patient is taking warfarin and comes in today for a routine follow up visit.  Positive risk factors for bleeding include an age of 73 years or older.  The bleeding index is 'intermediate risk'.  Positive CHADS2 values include History of HTN.  Negative CHADS2 values include Age > 73 years old.  The start date was 04/11/2000.  Her last INR was 2.4.  Anticoagulation responsible provider: Mare Ferrari, MD.  INR POC: 2.3.  Exp: 01/2011.    Anticoagulation Management Assessment/Plan:      The patient's current anticoagulation dose is Warfarin sodium 5 mg tabs: TAKE ONE TABLET BY MOUTH  DAILY AS DIRECTED BY COUMADIN CLINIC.  The target INR is 2 - 3.  The next INR is due 03/28/2010.  Anticoagulation instructions were given to patient.  Results were reviewed/authorized by Porfirio Oar, PharmD.  She was notified by Ernst Bowler, PharmD candidate.         Prior Anticoagulation Instructions: INR 3  No change to current regimen. Continue current dose of 1 tablet (5mg ) everday. Will re-check INR in 4 weeks.   Current Anticoagulation Instructions: INR 2.3 (INR goal:  2-3)  Take 1 tablet every day.  Recheck in 4 weeks.

## 2010-03-22 NOTE — Medication Information (Signed)
Summary: rov/sp  Anticoagulant Therapy  Managed by: Margaretha Sheffield, PharmD Referring MD: Kirk Ruths MD PCP: Graciella Belton MD Supervising MD: Ron Parker MD, Dellis Filbert Indication 1: Atrial Fibrillation (ICD-427.31) Lab Used: [facility] Site: [facility] INR POC 1.7 INR RANGE 2 - 3  Dietary changes: no    Health status changes: no    Bleeding/hemorrhagic complications: no    Recent/future hospitalizations: no    Any changes in medication regimen? no    Recent/future dental: yes     Details: pt may have a tooth extraction...will let us know when scheduled.  Any missed doses?: no       Is patient compliant with meds? yes       Allergies: No Known Drug Allergies  Anticoagulation Management History:      The patient is taking warfarin and comes in today for a routine follow up visit.  Positive risk factors for bleeding include an age of 75 years or older.  The bleeding index is 'intermediate risk'.  Positive CHADS2 values include History of HTN.  Negative CHADS2 values include Age > 73 years old.  The start date was 04/11/2000.  Anticoagulation responsible provider: Ron Parker MD, Dellis Filbert.  INR POC: 1.7.  Cuvette Lot#: [id number].  Exp: 09/2010.    Anticoagulation Management Assessment/Plan:      The patient's current anticoagulation dose is Warfarin sodium 5 mg tabs: TAKE ONE TABLET BY MOUTH  DAILY AS DIRECTED BY COUMADIN CLINIC.  The target INR is 2 - 3.  The next INR is due 07/26/2009.  Anticoagulation instructions were given to patient.  Results were reviewed/authorized by Margaretha Sheffield, PharmD.  She was notified by Margaretha Sheffield.         Prior Anticoagulation Instructions: INR 2.2  Continue same dose of 1 tablet every day except 1/2 tablet on Mondays   Current Anticoagulation Instructions: INR 1.7  Take 1.5 tablets today.  Then return to normal dosing schedule of 1/2 tablet on Monday and 1 tablet all other days.  Return to clinic in 4 weeks.

## 2010-03-22 NOTE — Progress Notes (Signed)
Summary: Rx Req   Phone Note Refill Request Call back at Home Phone [phone] Message from:  Patient  Refills Requested: Medication #1:  METOPROLOL TARTRATE 100 MG TABS Take 2 tablet by mouth twice a day [facility].  Initial call taken by: Raymond Gurney,  December 14, 2009 11:09 AM    Prescriptions: METOPROLOL TARTRATE 100 MG TABS (METOPROLOL TARTRATE) Take 2 tablet by mouth twice a day  #120 Tablet x 3   Entered and Authorized by:   Marlana Salvage MD   Signed by:   Marlana Salvage MD on 12/15/2009   Method used:   Electronically to        [facility] [phone]* (retail)       [place], [place]  [id number]       Ph: [phone] or [phone]       Fax: [phone]   RxID:   [id number]

## 2010-03-22 NOTE — Medication Information (Signed)
Summary: rov/eh  Anticoagulant Therapy  Managed by: Margaretha Sheffield, PharmD Referring MD: Kirk Ruths MD PCP: Aundria Mems MD Supervising MD: Johnsie Cancel MD, Collier Salina Indication 1: Atrial Fibrillation (ICD-427.31) Lab Used: [facility] Site: [facility] INR POC 2.0 INR RANGE 2 - 3  Dietary changes: no    Health status changes: no    Bleeding/hemorrhagic complications: no    Recent/future hospitalizations: no    Any changes in medication regimen? no    Recent/future dental: no  Any missed doses?: no       Is patient compliant with meds? yes       Allergies: No Known Drug Allergies  Anticoagulation Management History:      The patient is taking warfarin and comes in today for a routine follow up visit.  Positive risk factors for bleeding include an age of 75 years or older.  The bleeding index is 'intermediate risk'.  Positive CHADS2 values include History of HTN.  Negative CHADS2 values include Age > 68 years old.  The start date was 04/11/2000.  Anticoagulation responsible provider: Johnsie Cancel MD, Collier Salina.  INR POC: 2.0.  Cuvette Lot#: [id number].  Exp: 05/2010.    Anticoagulation Management Assessment/Plan:      The patient's current anticoagulation dose is Warfarin sodium 5 mg tabs: TAKE ONE TABLET BY MOUTH  DAILY AS DIRECTED BY COUMADIN CLINIC.  The target INR is 2 - 3.  The next INR is due 04/26/2009.  Anticoagulation instructions were given to patient.  Results were reviewed/authorized by Margaretha Sheffield, PharmD.  She was notified by Margaretha Sheffield.         Prior Anticoagulation Instructions: INR 1.8  Take 1.5 tablets today then resume 1 tablet daily except 0.5 tablets on Mondays and Fridays. Recheck in 3 weeks.  Current Anticoagulation Instructions: INR 2.0  Continue current dosing schedule.  Take 1/2 tablet on Monday and Friday and take 1 tablet all other days. Return to clinic in 4 weeks.

## 2010-03-22 NOTE — Assessment & Plan Note (Signed)
 Summary: meet new md wp   Vital Signs:  Patient Profile:   75 Years Old Female Height:     65 inches Weight:      234.5 pounds Temp:     97.7 degrees F oral Pulse rate:   86 / minute BP sitting:   179 / 96  (left arm)  Pt. in pain?   no  Vitals Entered By: JACK BLOODGOOD CMA, (January 08, 2008 8:37 AM)              Is Patient Diabetic? No    Last PAP:  Done. (06/18/2001 12:00:00 AM) PAP Next Due:  Not Indicated   PCP:  DEBBY PETTIES MD  Chief Complaint:  refill meds. meet new doctor. check right knee.  History of Present Illness: 16F  with HTN, OA, AFIB presents for routine follow up and refill of medications, also has c/o right knee pain for years.    HTN:  Pt takes meds however still has very elevated SBP.  Was scheduled to see renal MD many times however they have discharged her for multiple [facility].  Last BMET mild renal insufficiency with Cr: 1.25 normal potassium.  No HA, changes in vision, CP, SOB, or changes in urination.  Has not been on a thiazide 2/2 gout.  OA:  States she fell and hit knee years ago and has had pain ever since.  Not taking anything for knee pain.  Worse when weight bearing, prevents her from exercising.  AFIB:  on coumadin  and managed by cardiology.  No bleeding, CP, SOB, palpitations.      Past Medical History:    Reviewed history from 08/13/2007 and no changes required:       , enlarged R carotid art. Per ultrasound, hx cardiac tamponade -- 1996       atrial fibrillation  Past Surgical History:    Reviewed history from 04/17/2006 and no changes required:       BMD 1/03 - very good - 02/18/2001   Family History:    Reviewed history from 04/17/2006 and no changes required:       brother - CAD, CABG, MI at 29 y/o, father -- no htn, mother -- died when child, unknown cause  Social History:    Reviewed history from 04/17/2006 and no changes required:       lives with 2 grandchildren, 1 daughter; separated jan 99 from husband 34yrs;  high functioning, young for age    Review of Systems       See HPI   Physical Exam  General:     Well-developed,well-nourished,in no acute distress; alert,appropriate and cooperative throughout examination Head:     Normocephalic and atraumatic without obvious abnormalities. Eyes:     No corneal or conjunctival inflammation noted. EOMI.  Ears:     External ear exam shows no significant lesions or deformities.   Nose:     External nasal examination shows no deformity or inflammation. Mouth:     Oral mucosa and oropharynx without lesions or exudates.  Lungs:     Normal respiratory effort, chest expands symmetrically. Lungs are clear to auscultation, no crackles or wheezes. Heart:     Normal rate and irregularly irregular rhythm. S1 and S2 normal without gallop, murmur, click, rub or other extra sounds. Abdomen:     No bruits. Extremities:     Right knee, FROM, pain to palpation along joint line anteromedial, McMurry's negative, valgus and varus stress tests negative, lachman and anterior and posterior drawer  signs negative.  No swelling, no crepitus.    Impression & Recommendations:  Problem # 1:  HYPERTENSION, MALIGNANT ESSENTIAL (ICD-401.0) Assessment: Deteriorated Currently asymptomatic.  Still uncontrolled, fired by nephrologists for [facility], Has not had renal U/S so will order one.  Will also try to increase clonidine  from 0.2 two times a day to 0.3 two times a day.  Other antihypertensives maxed out. Unable to use thiazides 2/2 gout.  Will check BMET and U/A at next visit.  If no improvements at next visit, will consider switching metoprolol   to labetolol or carvedilol .  Also recommended dietary changes and less sodium intake.   Her updated medication list for this problem includes:    Doxazosin  Mesylate 8 Mg Tabs (Doxazosin  mesylate) ..... One tab by mouth qday    Cardizem  Cd 180 Mg Cp24 (Diltiazem  hcl coated beads) ..... One tablet by mouth qday    Clonidine  Hcl 0.3 Mg  Tabs (Clonidine  hcl) ..... One tab by mouth two times a day    Enalapril  Maleate 20 Mg Tabs (Enalapril  maleate) .SABRA... Take 2 tablet by mouth every morning    Metoprolol  Tartrate 100 Mg Tabs (Metoprolol  tartrate) .SABRA... Take 2 tablet by mouth twice a day  Orders: [facility]- Est  Level 4 (00785) Ultrasound (Ultrasound)   Problem # 2:  OSTEOARTHRITIS, LOWER LEG (ICD-715.96) Assessment: Unchanged Never had xray to confirm OA, and with Hx of trauma years ago, will get weight bearing XR.  Likely worsened by weight.  Likely will show joint space narrowing.  If tylenol  650 doesn't help, will inject joint at next visit.  Her updated medication list for this problem includes:    Aspirin  Low Strength 81 Mg Chew (Aspirin ) .SABRA... Take 1 tablet by mouth once a day    Tylenol  Arthritis Pain 650 Mg Cr-tabs (Acetaminophen ) ..... One tab by mouth q4-6h as needed pain in knee  Orders: Diagnostic X-Ray/Fluoroscopy (Diagnostic X-Ray/Flu) [facility]- Est  Level 4 (00785)   Problem # 3:  HYPERCHOLESTEROLEMIA (ICD-272.0) Assessment: Comment Only Will check lipid panel at next visit.  Problem # 4:  OTHER SCREENING MAMMOGRAM (ICD-V76.12) Assessment: New  Orders: [facility]- Est  Level 4 (99214) Mammogram (Screening) (Mammo)   Problem # 5:  ATRIAL FIBRILLATION (ICD-427.31) Assessment: Unchanged Managed by cardiology.  No warning signs, appears well controlled.  Her updated medication list for this problem includes:    Aspirin  Low Strength 81 Mg Chew (Aspirin ) .SABRA... Take 1 tablet by mouth once a day    Metoprolol  Tartrate 100 Mg Tabs (Metoprolol  tartrate) .SABRA... Take 2 tablet by mouth twice a day    Warfarin Sodium  5 Mg Tabs (Warfarin sodium )   Problem # 6:  OBESITY, NOS (ICD-278.00) Assessment: Unchanged Recommended exercise 30 mins 3-5 times a week.  Aerobic, goal HR 105 (70% maximum).  Will recheck weight at next visit. Orders: [facility]- Est  Level 4 (00785)   Complete Medication List: 1)  Doxazosin  Mesylate 8 Mg  Tabs (Doxazosin  mesylate) .... One tab by mouth qday 2)  Aspirin  Low Strength 81 Mg Chew (Aspirin ) .... Take 1 tablet by mouth once a day 3)  Cardizem  Cd 180 Mg Cp24 (Diltiazem  hcl coated beads) .... One tablet by mouth qday 4)  Clonidine  Hcl 0.3 Mg Tabs (Clonidine  hcl) .... One tab by mouth two times a day 5)  Enalapril  Maleate 20 Mg Tabs (Enalapril  maleate) .... Take 2 tablet by mouth every morning 6)  Metoprolol  Tartrate 100 Mg Tabs (Metoprolol  tartrate) .... Take 2 tablet by mouth twice a day  7)  Warfarin Sodium  5 Mg Tabs (Warfarin sodium ) 8)  Zantac 75 75 Mg Tabs (Ranitidine hcl) .... Take 1 tablet by mouth twice a day 9)  Tylenol  Arthritis Pain 650 Mg Cr-tabs (Acetaminophen ) .... One tab by mouth q4-6h as needed pain in knee   Patient Instructions: 1)  Good to meet you today, 2)  There are several things we are going to do. 3)  Increase your clonidine  to 0.3 two times a day 4)  Hemoccult cards, mammogram, knee xray, renal ultrasound,  5)  I will start tylenol  arthritis for your knee. 6)  You need to exercise for 30 minutes, 3-5 times a week. 7)  Make an appt to see me again in one month.   Prescriptions: TYLENOL  ARTHRITIS PAIN 650 MG CR-TABS (ACETAMINOPHEN ) One tab by mouth q4-6h as needed pain in knee  #1 pack x 6   Entered and Authorized by:   DEBBY PETTIES MD   Signed by:   DEBBY PETTIES MD on 01/08/2008   Method used:   [facility]ron[facility] to        [facility] [phone]* (retail)       [address]       [place], [place]  [address]       Ph: [phone] or [phone]       Fax: [phone]   RxID:   [id number] CLONIDINE  HCL 0.3 MG TABS (CLONIDINE  HCL) One tab by mouth two times a day  #62 x 4   Entered and Authorized by:   DEBBY PETTIES MD   Signed by:   DEBBY PETTIES MD on 01/08/2008   Method used:   [facility] [address] [phone][facility] (retail)       [address]       [place], [place]  [address]       Ph: [phone] or [phone]       Fax: [phone]   RxID:   [id number]  ]

## 2010-03-22 NOTE — Miscellaneous (Signed)
Summary: [facility]  Received FAX from [facility]:  "Patient a no show, no records to send."

## 2010-03-22 NOTE — Medication Information (Signed)
Summary: rov/eac  Anticoagulant Therapy  Managed by: Tula Nakayama, RN, BSN Referring MD: Kirk Ruths MD PCP: Graciella Belton MD Supervising MD: Johnsie Cancel MD, Collier Salina Indication 1: Atrial Fibrillation (ICD-427.31) Lab Used: [facility] Site: [facility] INR POC 1.5 INR RANGE 2 - 3  Dietary changes: no    Health status changes: no    Bleeding/hemorrhagic complications: no    Recent/future hospitalizations: no    Any changes in medication regimen? no    Recent/future dental: no  Any missed doses?: yes     Details: Missed last Tuesday's dose.   Is patient compliant with meds? yes       Allergies: No Known Drug Allergies  Anticoagulation Management History:      The patient is taking warfarin and comes in today for a routine follow up visit.  Positive risk factors for bleeding include an age of 73 years or older.  The bleeding index is 'intermediate risk'.  Positive CHADS2 values include History of HTN.  Negative CHADS2 values include Age > 73 years old.  The start date was 04/11/2000.  Anticoagulation responsible provider: Johnsie Cancel MD, Collier Salina.  INR POC: 1.5.  Cuvette Lot#: [id number].  Exp: 09/2010.    Anticoagulation Management Assessment/Plan:      The patient's current anticoagulation dose is Warfarin sodium 5 mg tabs: TAKE ONE TABLET BY MOUTH  DAILY AS DIRECTED BY COUMADIN CLINIC.  The target INR is 2 - 3.  The next INR is due 08/09/2009.  Anticoagulation instructions were given to patient.  Results were reviewed/authorized by Tula Nakayama, RN, BSN.  She was notified by Tula Nakayama, RN, BSN.         Prior Anticoagulation Instructions: INR 1.7  Take 1.5 tablets today.  Then return to normal dosing schedule of 1/2 tablet on Monday and 1 tablet all other days.  Return to clinic in 4 weeks.   Current Anticoagulation Instructions: INR 1.5 Today Take 1 1/2 pills and Thursday then resume 1 pill everyday except 1/2 pill on Mondays. Recheck in 2 weeks.

## 2010-03-22 NOTE — Letter (Signed)
Summary: Generic Letter  [facility]  [address]   [place], [place] [address]   Phone: [phone]  Fax: [phone]    10/07/2009  ABRIGAIL CORMAN [address] [place], [place]  [address]  Dear Ms. Heidt,    Please call our office to make an appointment.  Dr. Irish Elders has left and I am your new doctor.  I would like to meet you and follow up with your medical concerns.       Sincerely,   Marlana Salvage MD  Appended Document: Generic Letter mailed

## 2010-03-22 NOTE — Medication Information (Signed)
Summary: Jennifer Hinton   Anticoagulant Therapy  Managed by: Porfirio Oar, PharmD Referring MD: Kirk Ruths MD PCP: Marlana Salvage MD Supervising MD: Ron Parker MD, Dellis Filbert Indication 1: Atrial Fibrillation (ICD-427.31) Lab Used: [facility] Site: [facility] INR POC 2.4 INR RANGE 2 - 3  Dietary changes: no    Health status changes: no    Bleeding/hemorrhagic complications: no    Recent/future hospitalizations: no    Any changes in medication regimen? no    Recent/future dental: no  Any missed doses?: yes     Details: Missed dose on 10/12  Is patient compliant with meds? yes       Allergies: No Known Drug Allergies  Anticoagulation Management History:      The patient is taking warfarin and comes in today for a routine follow up visit.  Positive risk factors for bleeding include an age of 75 years or older.  The bleeding index is 'intermediate risk'.  Positive CHADS2 values include History of HTN.  Negative CHADS2 values include Age > 35 years old.  The start date was 04/11/2000.  Today's INR is 2.4.  Anticoagulation responsible provider: Ron Parker MD, Dellis Filbert.  INR POC: 2.4.  Cuvette Lot#: [id number].  Exp: 12/2010.    Anticoagulation Management Assessment/Plan:      The patient's current anticoagulation dose is Warfarin sodium 5 mg tabs: TAKE ONE TABLET BY MOUTH  DAILY AS DIRECTED BY COUMADIN CLINIC.  The target INR is 2 - 3.  The next INR is due 01/03/2010.  Anticoagulation instructions were given to patient.  Results were reviewed/authorized by Porfirio Oar, PharmD.  She was notified by Ralene Bathe, PharmD Candidate.         Prior Anticoagulation Instructions: INR 2.4  Continue same dose of 1 tablet every day.  Recheck INR in 4 weeks.   Current Anticoagulation Instructions: INR 2.4  Continue Coumadin as scheduled:  1 tablet every day of the week.    Return to clinic in 4 weeks.

## 2010-03-22 NOTE — Medication Information (Signed)
Summary: rov/tm  Anticoagulant Therapy  Managed by: Freddrick March, RN, BSN Referring MD: Kirk Ruths MD PCP: Graciella Belton MD Supervising MD: Aundra Dubin MD, Elo Marmolejos Indication 1: Atrial Fibrillation (ICD-427.31) Lab Used: [facility] Site: [facility] INR POC 2.0 INR RANGE 2 - 3  Dietary changes: no    Health status changes: no    Bleeding/hemorrhagic complications: no    Recent/future hospitalizations: no    Any changes in medication regimen? no    Recent/future dental: no  Any missed doses?: no       Is patient compliant with meds? yes       Allergies: No Known Drug Allergies  Anticoagulation Management History:      The patient is taking warfarin and comes in today for a routine follow up visit.  Positive risk factors for bleeding include an age of 75 years or older.  The bleeding index is 'intermediate risk'.  Positive CHADS2 values include History of HTN.  Negative CHADS2 values include Age > 68 years old.  The start date was 04/11/2000.  Anticoagulation responsible provider: Aundra Dubin MD, Kenlynn Houde.  INR POC: 2.0.  Cuvette Lot#: [id number].  Exp: 10/2010.    Anticoagulation Management Assessment/Plan:      The patient's current anticoagulation dose is Warfarin sodium 5 mg tabs: TAKE ONE TABLET BY MOUTH  DAILY AS DIRECTED BY COUMADIN CLINIC.  The target INR is 2 - 3.  The next INR is due 09/13/2009.  Anticoagulation instructions were given to patient.  Results were reviewed/authorized by Freddrick March, RN, BSN.  She was notified by Freddrick March RN.         Prior Anticoagulation Instructions: INR 1.6 Today take 1 1/2 pills then change dose to 1 pill everyday. Recheck in 2 weeks.    Current Anticoagulation Instructions: INR 2.0  Take 1.5 tablets today, then resume same dosage 1 tablet daily.  Recheck in 3 weeks.

## 2010-03-22 NOTE — Medication Information (Signed)
Summary: rov/eac  Anticoagulant Therapy  Managed by: Freddrick March, RN, BSN Referring MD: Kirk Ruths MD PCP: Aundria Mems MD Supervising MD: Haroldine Laws MD, Quillian Quince Indication 1: Atrial Fibrillation (ICD-427.31) Lab Used: [facility] Site: [facility] INR POC 1.5 INR RANGE 2 - 3  Dietary changes: no    Health status changes: no    Bleeding/hemorrhagic complications: no    Recent/future hospitalizations: no    Any changes in medication regimen? no    Recent/future dental: no  Any missed doses?: yes     Details: Missed Thursday's dose last week.  Is patient compliant with meds? yes       Allergies (verified): No Known Drug Allergies  Anticoagulation Management History:      The patient is taking warfarin and comes in today for a routine follow up visit.  Positive risk factors for bleeding include an age of 75 years or older.  The bleeding index is 'intermediate risk'.  Positive CHADS2 values include History of HTN.  Negative CHADS2 values include Age > 73 years old.  The start date was 04/11/2000.  Anticoagulation responsible provider: Neira Bentsen MD, Quillian Quince.  INR POC: 1.5.  Cuvette Lot#: [id number].  Exp: 06/2010.    Anticoagulation Management Assessment/Plan:      The patient's current anticoagulation dose is Warfarin sodium 5 mg tabs: TAKE ONE TABLET BY MOUTH  DAILY AS DIRECTED BY COUMADIN CLINIC.  The target INR is 2 - 3.  The next INR is due 05/10/2009.  Anticoagulation instructions were given to patient.  Results were reviewed/authorized by Freddrick March, RN, BSN.  She was notified by Freddrick March RN.         Prior Anticoagulation Instructions: INR 2.0  Continue current dosing schedule.  Take 1/2 tablet on Monday and Friday and take 1 tablet all other days. Return to clinic in 4 weeks.  Current Anticoagulation Instructions: INR 1.5  Take 1.5 today, then start taking 1 tablet daily except 1/2 tablet on Mondays.  Recheck in 2 weeks.

## 2010-03-22 NOTE — Progress Notes (Signed)
 Summary: resch  Phone Note Call from Patient   Caller: Patient Summary of Call: pt rescheduled Initial call taken by: KARNA SEMINOLE,  February 15, 2008 8:58 AM  Follow-up for Phone Call        Thanks,  Please remove her from my clinic schedule today and I can take a work-in in her place  -Dr. Curtis Follow-up by: DEBBY CURTIS MD,  February 15, 2008 11:10 AM

## 2010-03-22 NOTE — Progress Notes (Signed)
Summary: Rx   Phone Note Refill Request Call back at Home Phone [phone]   Refills Requested: Medication #1:  DOXAZOSIN MESYLATE 8 MG  TABS one tab by mouth daily  Medication #2:  CARDIZEM CD 180 MG  CP24 one tablet by mouth daily.  Medication #3:  HYDRALAZINE HCL 25 MG TABS 1 tab by mouth two times a day for high blood pressure Initial call taken by: Samara Snide,  February 22, 2010 8:34 AM  Follow-up for Phone Call         states she has been trying to get meds for a week. This is the first notice we have had. advised patient will send in now and she needs to schedule follow up visit with MD. Follow-up by: Marcell Barlow RN,  February 22, 2010 8:40 AM

## 2010-03-22 NOTE — Medication Information (Signed)
Summary: rov/kh   Anticoagulant Therapy  Managed by: Doran Stabler PharmD Referring MD: Kirk Ruths MD PCP: Marlana Salvage MD Supervising MD: Mare Ferrari, MD Indication 1: Atrial Fibrillation (ICD-427.31) Lab Used: [facility] Site: [facility] INR POC 3 INR RANGE 2 - 3  Dietary changes: no    Health status changes: no    Bleeding/hemorrhagic complications: no    Recent/future hospitalizations: no    Any changes in medication regimen? no    Recent/future dental: no  Any missed doses?: no       Is patient compliant with meds? yes       Allergies: No Known Drug Allergies  Anticoagulation Management History:      The patient is taking warfarin and comes in today for a routine follow up visit.  Positive risk factors for bleeding include an age of 72 years or older.  The bleeding index is 'intermediate risk'.  Positive CHADS2 values include History of HTN.  Negative CHADS2 values include Age > 23 years old.  The start date was 04/11/2000.  Her last INR was 2.4.  Anticoagulation responsible provider: Mare Ferrari, MD.  INR POC: 3.  Cuvette Lot#: [id number].  Exp: 01/2011.    Anticoagulation Management Assessment/Plan:      The patient's current anticoagulation dose is Warfarin sodium 5 mg tabs: TAKE ONE TABLET BY MOUTH  DAILY AS DIRECTED BY COUMADIN CLINIC.  The target INR is 2 - 3.  The next INR is due 02/28/2010.  Anticoagulation instructions were given to patient.  Results were reviewed/authorized by Doran Stabler PharmD.         Prior Anticoagulation Instructions: INR 3.1  Continue same dose of 1 tablet every day.  Eat a serving of greens today.  Recheck INR in 4 weeks.   Current Anticoagulation Instructions: INR 3  No change to current regimen. Continue current dose of 1 tablet (5mg ) everday. Will re-check INR in 4 weeks.

## 2010-03-22 NOTE — Miscellaneous (Signed)
 Summary: [facility]  Clinical Lists Changes rec'd call from their office. pt has no showed multiple times. they will not reschedule her.RAEJEAN MAU RN  November 04, 2007 5:14 PM

## 2010-03-22 NOTE — Medication Information (Signed)
Summary: rov/sp  Anticoagulant Therapy  Managed by: Tula Nakayama, RN, BSN Referring MD: Kirk Ruths MD PCP: Graciella Belton MD Supervising MD: Aundra Dubin MD, Dalton Indication 1: Atrial Fibrillation (ICD-427.31) Lab Used: [facility] Site: [facility] INR POC 2.2 INR RANGE 2 - 3  Dietary changes: no    Health status changes: no    Bleeding/hemorrhagic complications: no    Recent/future hospitalizations: no    Any changes in medication regimen? no    Recent/future dental: no  Any missed doses?: no       Is patient compliant with meds? yes       Allergies: No Known Drug Allergies  Anticoagulation Management History:      The patient is taking warfarin and comes in today for a routine follow up visit.  Positive risk factors for bleeding include an age of 73 years or older.  The bleeding index is 'intermediate risk'.  Positive CHADS2 values include History of HTN.  Negative CHADS2 values include Age > 41 years old.  The start date was 04/11/2000.  Anticoagulation responsible provider: Aundra Dubin MD, Dalton.  INR POC: 2.2.  Cuvette Lot#: [id number].  Exp: 11/2010.    Anticoagulation Management Assessment/Plan:      The patient's current anticoagulation dose is Warfarin sodium 5 mg tabs: TAKE ONE TABLET BY MOUTH  DAILY AS DIRECTED BY COUMADIN CLINIC.  The target INR is 2 - 3.  The next INR is due 11/08/2009.  Anticoagulation instructions were given to patient.  Results were reviewed/authorized by Tula Nakayama, RN, BSN.  She was notified by Tula Nakayama, RN, BSN.         Prior Anticoagulation Instructions: INR 2.8  Continue same dose of 1 tablet every day.  Recheck in 4 weeks.   Current Anticoagulation Instructions: INR 2.2 Continue 5mg s daily. Recheck in 4 weeks.

## 2010-03-22 NOTE — Medication Information (Signed)
Summary: rov/tm  Anticoagulant Therapy  Managed by: Porfirio Oar, PharmD Referring MD: Kirk Ruths MD PCP: Marlana Salvage MD Supervising MD: Aundra Dubin MD, Dalton Indication 1: Atrial Fibrillation (ICD-427.31) Lab Used: [facility] Site: [facility] INR POC 2.4 INR RANGE 2 - 3  Dietary changes: no    Health status changes: no    Bleeding/hemorrhagic complications: no    Recent/future hospitalizations: no    Any changes in medication regimen? no    Recent/future dental: no  Any missed doses?: no       Is patient compliant with meds? yes       Allergies: No Known Drug Allergies  Anticoagulation Management History:      The patient is taking warfarin and comes in today for a routine follow up visit.  Positive risk factors for bleeding include an age of 75 years or older.  The bleeding index is 'intermediate risk'.  Positive CHADS2 values include History of HTN.  Negative CHADS2 values include Age > 58 years old.  The start date was 04/11/2000.  Anticoagulation responsible provider: Aundra Dubin MD, Dalton.  INR POC: 2.4.  Cuvette Lot#: [id number].  Exp: 11/2010.    Anticoagulation Management Assessment/Plan:      The patient's current anticoagulation dose is Warfarin sodium 5 mg tabs: TAKE ONE TABLET BY MOUTH  DAILY AS DIRECTED BY COUMADIN CLINIC.  The target INR is 2 - 3.  The next INR is due 12/06/2009.  Anticoagulation instructions were given to patient.  Results were reviewed/authorized by Porfirio Oar, PharmD.  She was notified by Porfirio Oar PharmD.         Prior Anticoagulation Instructions: INR 2.2 Continue 5mg s daily. Recheck in 4 weeks.   Current Anticoagulation Instructions: INR 2.4  Continue same dose of 1 tablet every day.  Recheck INR in 4 weeks.

## 2010-03-22 NOTE — Miscellaneous (Signed)
   Clinical Lists Changes  Medications: Rx of DOXAZOSIN MESYLATE 8 MG  TABS (DOXAZOSIN MESYLATE) one tab by mouth daily;  #30 x 2;  Signed;  Entered by: Graciella Belton MD;  Authorized by: Graciella Belton MD;  Method used: Electronically to [facility] [phone]*, [address] [place], [place], [place]  [id number], Ph: [phone] or [phone], Fax: [phone] Rx of CARDIZEM CD 180 MG  CP24 (DILTIAZEM HCL COATED BEADS) one tablet by mouth daily.;  #30 x 2;  Signed;  Entered by: Graciella Belton MD;  Authorized by: Graciella Belton MD;  Method used: Electronically to [facility] [phone]*, [address], [place], [place], [place]  [id number], Ph: [phone] or [phone], Fax: [phone]    Prescriptions: CARDIZEM CD 180 MG  CP24 (DILTIAZEM HCL COATED BEADS) one tablet by mouth daily.  #30 x 2   Entered and Authorized by:   Graciella Belton MD   Signed by:   Graciella Belton MD on 06/19/2009   Method used:   Electronically to        [facility] [phone]* (retail)       [place], [place]  [id number]       Ph: [phone] or [phone]       Fax: [phone]   RxID:   [id number] DOXAZOSIN MESYLATE 8 MG  TABS (DOXAZOSIN MESYLATE) one tab by mouth daily  #30 x 2   Entered and Authorized by:   Graciella Belton MD   Signed by:   Graciella Belton MD on 06/19/2009   Method used:   Electronically to        [facility] [phone]* (retail)       [place], [place]  [id number]       Ph: [phone] or [phone]       Fax: [phone]   RxID:   [id number]

## 2010-03-22 NOTE — Assessment & Plan Note (Signed)
Summary: bp/eo   Vital Signs:  Patient profile:   75 year old female Height:      65 inches Weight:      213.9 pounds BMI:     35.72 Temp:     97.8 degrees F oral Pulse rate:   88 / minute BP sitting:   194 / 102  (left arm) Cuff size:   large  Vitals Entered By: Levert Feinstein LPN (April 13, 624THL [id number] AM)  Serial Vital Signs/Assessments:  Comments: 9:22 AM Manual BP: 190/118 By: Levert Feinstein LPN   CC: f/u bp Is Patient Diabetic? No Pain Assessment Patient in pain? no        Primary Care Provider:  Graciella Belton MD  CC:  f/u bp.  History of Present Illness: 75 yo female here for f/u of uncontrolled HTN.  Taking meds as prescribed, has pill bottles with her and they correspond to prescriptions.  Also has daily pill organizer appropriately filled.  Denies chest pain, dyspnea, LE edema.  Habits & Providers  Alcohol-Tobacco-Diet     Tobacco Status: never  Allergies: No Known Drug Allergies  Physical Exam  Additional Exam:  VITALS:  Reviewed, hypertensive GEN: Alert & oriented, no acute distress NECK: Midline trachea, no masses/thyromegaly, no cervical lymphadenopathy CARDIO: Irregularly irregular rhythm with normal rate, no murmurs/rubs/gallops, 2+ bilateral radial pulses RESP: Clear to auscultation, normal work of breathing, no retractions/accessory muscle use EXT: Nontender, 1+ edema    Impression & Recommendations:  Problem # 1:  HYPERTENSION, MALIGNANT ESSENTIAL (ICD-401.0) Assessment Unchanged Question of renal artery stenosis in poorly controlled hypertension on 5 BP meds.  Misses appointments at nephrology in past, but states she went last year to [place] and nothing was wrong with her kidneys.  In past have considered medication nonadherence, but today she presents with appropriately filled pill organizer.  Will call for nephrology records.  Her updated medication list for this problem includes:    Doxazosin Mesylate 8 Mg Tabs  (Doxazosin mesylate) ..... One tab by mouth daily    Cardizem Cd 180 Mg Cp24 (Diltiazem hcl coated beads) ..... One tablet by mouth daily.    Enalapril Maleate 20 Mg Tabs (Enalapril maleate) .Marland Kitchen... 1 tab by mouth two times a day    Metoprolol Tartrate 100 Mg Tabs (Metoprolol tartrate) .Marland Kitchen... Take 2 tablet by mouth twice a day    Hydralazine Hcl 25 Mg Tabs (Hydralazine hcl) .Marland Kitchen... 1 tab by mouth two times a day for high blood pressure  BP today: 191/112 Prior BP: 204/139 (11/30/2008)  Prior 10 Yr Risk Heart Disease: Not enough information (12/17/2006)  Labs Reviewed: K+: 3.7 (11/30/2008) Creat: : 0.99 (11/30/2008)   Chol: 136 (11/30/2008)   HDL: 45 (11/30/2008)   LDL: 78 (11/30/2008)   TG: 64 (11/30/2008)  Orders: [place]- Est Level  3 [id number] Basic Met-[facility] [id number])  Complete Medication List: 1)  Aspirin Low Strength 81 Mg Chew (Aspirin) .... Take 1 tablet by mouth once a day 2)  Doxazosin Mesylate 8 Mg Tabs (Doxazosin mesylate) .... One tab by mouth daily 3)  Cardizem Cd 180 Mg Cp24 (Diltiazem hcl coated beads) .... One tablet by mouth daily. 4)  Enalapril Maleate 20 Mg Tabs (Enalapril maleate) .Marland Kitchen.. 1 tab by mouth two times a day 5)  Metoprolol Tartrate 100 Mg Tabs (Metoprolol tartrate) .... Take 2 tablet by mouth twice a day 6)  Warfarin Sodium 5 Mg Tabs (Warfarin sodium) .... Take one tablet by mouth  daily as directed by coumadin clinic  7)  Hydralazine Hcl 25 Mg Tabs (Hydralazine hcl) .Marland Kitchen.. 1 tab by mouth two times a day for high blood pressure 8)  Tylenol Arthritis Pain 650 Mg Cr-tabs (Acetaminophen) .... One tab by mouth q4-6h as needed pain in knee  Patient Instructions: 1)  Good to see you today. 2)  Please schedule a follow-up appointment in 3 months .

## 2010-03-28 ENCOUNTER — Encounter (INDEPENDENT_AMBULATORY_CARE_PROVIDER_SITE_OTHER): Payer: Medicare Other

## 2010-03-28 ENCOUNTER — Encounter: Payer: Self-pay | Admitting: Cardiology

## 2010-03-28 DIAGNOSIS — I4891 Unspecified atrial fibrillation: Secondary | ICD-10-CM

## 2010-03-28 DIAGNOSIS — Z7901 Long term (current) use of anticoagulants: Secondary | ICD-10-CM

## 2010-03-28 LAB — CONVERTED CEMR LAB: POC INR: 2.5

## 2010-03-29 DIAGNOSIS — I4891 Unspecified atrial fibrillation: Secondary | ICD-10-CM

## 2010-04-05 NOTE — Medication Information (Signed)
Summary: [facility]in Clinic   Anticoagulant Therapy  Managed by: Porfirio Oar, PharmD Referring MD: Kirk Ruths MD PCP: Marlana Salvage MD Supervising MD: Aundra Dubin MD, Kylon Philbrook Indication 1: Atrial Fibrillation (ICD-427.31) Lab Used: [facility] Site: [facility] INR POC 2.5 INR RANGE 2 - 3  Dietary changes: no    Health status changes: no    Bleeding/hemorrhagic complications: no    Recent/future hospitalizations: no    Any changes in medication regimen? no    Recent/future dental: no  Any missed doses?: no       Is patient compliant with meds? yes       Allergies: No Known Drug Allergies  Anticoagulation Management History:      The patient is taking warfarin and comes in today for a routine follow up visit.  Positive risk factors for bleeding include an age of 39 years or older.  The bleeding index is 'intermediate risk'.  Positive CHADS2 values include History of HTN.  Negative CHADS2 values include Age > 24 years old.  The start date was 04/11/2000.  Her last INR was 2.4.  Anticoagulation responsible provider: Aundra Dubin MD, Mikail Goostree.  INR POC: 2.5.  Cuvette Lot#: [id number].  Exp: 03/2011.    Anticoagulation Management Assessment/Plan:      The patient's current anticoagulation dose is Warfarin sodium 5 mg tabs: TAKE ONE TABLET BY MOUTH  DAILY AS DIRECTED BY COUMADIN CLINIC.  The target INR is 2 - 3.  The next INR is due 04/25/2010.  Anticoagulation instructions were given to patient.  Results were reviewed/authorized by Porfirio Oar, PharmD.  She was notified by Mliss Sax PharmD Candidate.         Prior Anticoagulation Instructions: INR 2.3 (INR goal:  2-3)  Take 1 tablet every day.  Recheck in 4 weeks.  Current Anticoagulation Instructions: INR 2.5   Continue your current Coumadin dose of 1 tablet everyday.  Recheck in 4 weeks.

## 2010-04-10 ENCOUNTER — Encounter: Payer: Self-pay | Admitting: Family Medicine

## 2010-04-10 ENCOUNTER — Ambulatory Visit (INDEPENDENT_AMBULATORY_CARE_PROVIDER_SITE_OTHER): Payer: Medicare Other | Admitting: Family Medicine

## 2010-04-10 DIAGNOSIS — IMO0002 Reserved for concepts with insufficient information to code with codable children: Secondary | ICD-10-CM

## 2010-04-10 DIAGNOSIS — I10 Essential (primary) hypertension: Secondary | ICD-10-CM

## 2010-04-10 DIAGNOSIS — M1710 Unilateral primary osteoarthritis, unspecified knee: Secondary | ICD-10-CM

## 2010-04-10 DIAGNOSIS — E7800 Pure hypercholesterolemia, unspecified: Secondary | ICD-10-CM

## 2010-04-10 MED ORDER — HYDRALAZINE HCL 25 MG PO TABS: 25.0000 mg | ORAL_TABLET | Freq: Three times a day (TID) | ORAL | Status: DC

## 2010-04-10 MED ORDER — DILTIAZEM HCL ER COATED BEADS 180 MG PO CP24: 180.0000 mg | ORAL_CAPSULE | Freq: Every day | ORAL | Status: DC

## 2010-04-10 MED ORDER — TRAMADOL HCL 50 MG PO TABS
50.0000 mg | ORAL_TABLET | Freq: Four times a day (QID) | ORAL | Status: AC | PRN
Start: 2010-04-10 — End: 2011-04-10

## 2010-04-10 MED ORDER — METOPROLOL TARTRATE 100 MG PO TABS: 200.0000 mg | ORAL_TABLET | Freq: Two times a day (BID) | ORAL | Status: DC

## 2010-04-10 MED ORDER — DOXAZOSIN MESYLATE 8 MG PO TABS: 8.0000 mg | ORAL_TABLET | Freq: Every day | ORAL | Status: DC

## 2010-04-10 MED ORDER — ENALAPRIL MALEATE 20 MG PO TABS: 20.0000 mg | ORAL_TABLET | Freq: Two times a day (BID) | ORAL | Status: DC

## 2010-04-10 NOTE — Progress Notes (Signed)
  Subjective:    Patient ID: Jennifer Hinton, female    DOB: 1935/10/08, 75 y.o.   MRN: [id number]  HPI Knee pain- had for several years.  Not getting worse, however tylenol doesn't help much.  Worse with standing, going down stairs.  Does not swell, not worse at any time of day.  HTN- takings meds as prescribed.  Does not check BP at home.  No problems with meds.   Review of Systems Denies rash, fever, HA, palpitations, CP    Objective:   Physical Exam  Constitutional: She appears well-developed and well-nourished.  HENT:  Head: Normocephalic and atraumatic.  Eyes: Conjunctivae and EOM are normal.  Neck: Normal range of motion.  Cardiovascular: Normal rate and normal heart sounds.        Irregular rhythm  Pulmonary/Chest: Effort normal and breath sounds normal.  Abdominal: Soft. Bowel sounds are normal.  Skin: Skin is warm and dry.       Large lipoma left shoulder          Assessment & Plan:

## 2010-04-10 NOTE — Patient Instructions (Signed)
It was nice to see you again Please make an appt for a lab visit to check your cholesterol Also, if you want, make an appt for a FREE wellness visit For your knee pain, try the tramadol with the tylenol 1-2 x/day For your blood pressure, please increase to hydralazine three times a day Please come back in 3 months for a recheck.

## 2010-04-10 NOTE — Assessment & Plan Note (Signed)
Stable, will add tramadol for pain control.  Pt does not want any injections at this time.

## 2010-04-10 NOTE — Assessment & Plan Note (Signed)
BP elevated though on recheck was 140/98.  Hydralazine is currently only BID.  Will change to TID.  At wellness visit will recheck BP.  Sending for CMET, Lipid check at future lab appt.

## 2010-04-25 ENCOUNTER — Encounter: Payer: Self-pay | Admitting: Cardiology

## 2010-04-25 ENCOUNTER — Encounter (INDEPENDENT_AMBULATORY_CARE_PROVIDER_SITE_OTHER): Payer: Medicare Other

## 2010-04-25 DIAGNOSIS — Z7901 Long term (current) use of anticoagulants: Secondary | ICD-10-CM

## 2010-04-25 DIAGNOSIS — I4891 Unspecified atrial fibrillation: Secondary | ICD-10-CM

## 2010-05-01 NOTE — Medication Information (Signed)
Summary: Jennifer Hinton   Anticoagulant Therapy  Managed by: Danella Penton, RN Referring MD: Kirk Ruths MD PCP: Marlana Salvage MD Supervising MD: Ron Parker MD, Dellis Filbert Indication 1: Atrial Fibrillation (ICD-427.31) Lab Used: [facility] Site: [facility] INR POC 2.5 INR RANGE 2 - 3  Dietary changes: no    Health status changes: no    Bleeding/hemorrhagic complications: no    Recent/future hospitalizations: no    Any changes in medication regimen? no    Recent/future dental: no  Any missed doses?: no       Is patient compliant with meds? yes       Allergies: No Known Drug Allergies  Anticoagulation Management History:      The patient is taking warfarin and comes in today for a routine follow up visit.  Positive risk factors for bleeding include an age of 73 years or older.  The bleeding index is 'intermediate risk'.  Positive CHADS2 values include History of HTN.  Negative CHADS2 values include Age > 55 years old.  The start date was 04/11/2000.  Her last INR was 2.4.  Anticoagulation responsible provider: Ron Parker MD, Dellis Filbert.  INR POC: 2.5.  Cuvette Lot#: [id number].  Exp: 02/2011.    Anticoagulation Management Assessment/Plan:      The patient's current anticoagulation dose is Warfarin sodium 5 mg tabs: TAKE ONE TABLET BY MOUTH  DAILY AS DIRECTED BY COUMADIN CLINIC.  The target INR is 2 - 3.  The next INR is due 05/23/2010.  Anticoagulation instructions were given to patient.  Results were reviewed/authorized by Danella Penton, RN.  She was notified by Danella Penton, RN.         Prior Anticoagulation Instructions: INR 2.5   Continue your current Coumadin dose of 1 tablet everyday.  Recheck in 4 weeks.    Current Anticoagulation Instructions: INR 2.5 Continue taking 1 tablet every day. Recheck in 4 weeks.

## 2010-05-14 ENCOUNTER — Telehealth: Payer: Self-pay | Admitting: Family Medicine

## 2010-05-14 NOTE — Telephone Encounter (Signed)
Diarrhea is resolving but still nauseated.  Told her there was not much we could do for nausea other then recommend that she eat bland foods and sip on fluids.  Told her this would probably resolve as the diarrhea did.  She does have Peptobismal so told her to try some of that.  Instructed her to call us back if she begins to throw up and we would work her in.

## 2010-05-14 NOTE — Telephone Encounter (Signed)
Has diarrhea and feels like she has to throw up, but doesn't - wants to know what she can do.

## 2010-05-23 ENCOUNTER — Ambulatory Visit (INDEPENDENT_AMBULATORY_CARE_PROVIDER_SITE_OTHER): Payer: Medicare Other | Admitting: *Deleted

## 2010-05-23 DIAGNOSIS — Z7901 Long term (current) use of anticoagulants: Secondary | ICD-10-CM | POA: Insufficient documentation

## 2010-05-23 DIAGNOSIS — I4891 Unspecified atrial fibrillation: Secondary | ICD-10-CM

## 2010-05-23 LAB — POCT INR: INR: 2.8

## 2010-05-23 NOTE — Patient Instructions (Signed)
INR 2.8  Continue same dose of 1 tablet every day.  Recheck INR in 4 weeks.

## 2010-05-28 ENCOUNTER — Other Ambulatory Visit: Payer: Self-pay | Admitting: Cardiology

## 2010-06-20 ENCOUNTER — Ambulatory Visit (INDEPENDENT_AMBULATORY_CARE_PROVIDER_SITE_OTHER): Payer: Medicare Other | Admitting: *Deleted

## 2010-06-20 DIAGNOSIS — I4891 Unspecified atrial fibrillation: Secondary | ICD-10-CM

## 2010-06-20 LAB — POCT INR: INR: 2.1

## 2010-07-03 NOTE — Assessment & Plan Note (Signed)
[facility] OFFICE NOTE   MONSERATH, ARCHIBALD                       MRN:          [id number]  DATE:03/11/2007                            DOB:          06/21/1935    Ms. Withington is a pleasant 75 year old female who has a history of  permanent atrial fibrillation as well as hypertension.  Since I last saw  her she is doing well from a symptomatic standpoint.  She denies any  chest pain, palpitations, dyspnea or syncope.  There is no pedal edema.   MEDICATIONS:  1. Coumadin as directed.  2. Aspirin 81 mg daily.  3. Vasotec 20 mg p.o. b.i.d.  4. Clonidine 0.2 mg p.o. daily.  5. Cardizem CD 240 mg p.o. daily.  6. Lopressor 100 mg p.o. b.i.d.  7. Doxazosin 1 mg p.o. daily.   PHYSICAL EXAM:  Today shows a blood pressure of 180/100.  Her pulse is  71.  HEENT:  Normal.  NECK:  Supple.  CHEST:  Clear.  CARDIOVASCULAR:  Reveals an irregular rhythm.  ABDOMINAL:  Exam is benign.  EXTREMITIES:  Show no edema.   Her electrocardiogram shows atrial fibrillation at a rate of 71.  A  prior septal infarct cannot be excluded.  There are minor nonspecific ST  changes.   DIAGNOSES:  1. Permanent atrial fibrillation - She will continue on her Lopressor      and Cardizem for rate control.  I will check a 24-hour Holter      monitor to make sure that her rate is adequately controlled for 24      hours.  She will continue on Coumadin with a goal INR of 2-3.  2. History of moderate mitral regurgitation - We will plan to repeat      her echocardiogram.  3. Hypertension - Her blood pressure is elevated today, but the      patient is somewhat unsure of her medications.  I have asked to      contact us and based on her present doses we will either increase      her clonidine or her doxazosin.  She will see [place] back in 6 weeks      and we will recheck her blood pressure at that time.  4. Coumadin therapy - This is being monitored in  our Coumadin clinic      with goal INR of 2-3.  We will obtain her most recent laboratories      from her primary care physician.     Denice Bors Stanford Breed, MD, [facility]  Electronically Signed   BSC/MedQ  DD: 03/11/2007  DT: 03/11/2007  Job #: [id number]

## 2010-07-18 ENCOUNTER — Ambulatory Visit (INDEPENDENT_AMBULATORY_CARE_PROVIDER_SITE_OTHER): Payer: Medicare Other | Admitting: *Deleted

## 2010-07-18 DIAGNOSIS — I4891 Unspecified atrial fibrillation: Secondary | ICD-10-CM

## 2010-08-15 ENCOUNTER — Ambulatory Visit (INDEPENDENT_AMBULATORY_CARE_PROVIDER_SITE_OTHER): Payer: Medicare Other | Admitting: *Deleted

## 2010-08-15 DIAGNOSIS — I4891 Unspecified atrial fibrillation: Secondary | ICD-10-CM

## 2010-08-25 ENCOUNTER — Other Ambulatory Visit: Payer: Self-pay | Admitting: Family Medicine

## 2010-08-26 NOTE — Telephone Encounter (Signed)
Refill request

## 2010-09-05 ENCOUNTER — Ambulatory Visit (INDEPENDENT_AMBULATORY_CARE_PROVIDER_SITE_OTHER): Payer: Medicare Other | Admitting: *Deleted

## 2010-09-05 DIAGNOSIS — I4891 Unspecified atrial fibrillation: Secondary | ICD-10-CM

## 2010-10-03 ENCOUNTER — Ambulatory Visit (INDEPENDENT_AMBULATORY_CARE_PROVIDER_SITE_OTHER): Payer: Medicare Other | Admitting: *Deleted

## 2010-10-03 DIAGNOSIS — I4891 Unspecified atrial fibrillation: Secondary | ICD-10-CM

## 2010-10-03 LAB — POCT INR: INR: 2.1

## 2010-10-03 MED ORDER — WARFARIN SODIUM 5 MG PO TABS: ORAL_TABLET | ORAL | Status: DC

## 2010-10-31 ENCOUNTER — Ambulatory Visit (INDEPENDENT_AMBULATORY_CARE_PROVIDER_SITE_OTHER): Payer: Medicare Other | Admitting: *Deleted

## 2010-10-31 DIAGNOSIS — I4891 Unspecified atrial fibrillation: Secondary | ICD-10-CM

## 2010-10-31 LAB — POCT INR: INR: 2

## 2010-11-06 ENCOUNTER — Ambulatory Visit: Payer: Medicare Other | Admitting: Family Medicine

## 2010-11-07 ENCOUNTER — Other Ambulatory Visit: Payer: Self-pay | Admitting: Family Medicine

## 2010-11-07 NOTE — Telephone Encounter (Signed)
Refill request

## 2010-11-13 ENCOUNTER — Other Ambulatory Visit: Payer: Self-pay | Admitting: Family Medicine

## 2010-11-13 NOTE — Telephone Encounter (Signed)
Refill request

## 2010-11-22 ENCOUNTER — Other Ambulatory Visit: Payer: Self-pay | Admitting: Family Medicine

## 2010-11-22 NOTE — Telephone Encounter (Signed)
Refill request

## 2010-11-23 ENCOUNTER — Encounter: Payer: Self-pay | Admitting: Family Medicine

## 2010-11-23 ENCOUNTER — Ambulatory Visit (INDEPENDENT_AMBULATORY_CARE_PROVIDER_SITE_OTHER): Payer: Medicare Other | Admitting: Family Medicine

## 2010-11-23 VITALS — BP 182/77 | HR 77 | Temp 97.8°F | Ht 65.5 in | Wt 197.7 lb

## 2010-11-23 DIAGNOSIS — Z23 Encounter for immunization: Secondary | ICD-10-CM

## 2010-11-23 DIAGNOSIS — I10 Essential (primary) hypertension: Secondary | ICD-10-CM

## 2010-11-23 DIAGNOSIS — M1710 Unilateral primary osteoarthritis, unspecified knee: Secondary | ICD-10-CM

## 2010-11-23 DIAGNOSIS — IMO0002 Reserved for concepts with insufficient information to code with codable children: Secondary | ICD-10-CM

## 2010-11-23 MED ORDER — DILTIAZEM HCL ER COATED BEADS 240 MG PO CP24: 240.0000 mg | ORAL_CAPSULE | Freq: Every day | ORAL | Status: DC

## 2010-11-23 NOTE — Assessment & Plan Note (Signed)
Patient with elevated blood pressure. We increased her Cardizem to 240 today. I spoke with Dr. Valentina Lucks about this patient. She will followup in pharmacy clinic in one to 2 weeks. She may be a candidate for an ambulatory blood pressure monitoring study.

## 2010-11-23 NOTE — Patient Instructions (Signed)
It was nice to see you again today! Today we changed your Cardizem to 240 mg per day. I would like you to see Dr. Valentina Lucks in the pharmacy clinic here for blood pressure management. Please make that appointment for one to 2 weeks from now. I will call you if anything shows up on your blood work.

## 2010-11-23 NOTE — Assessment & Plan Note (Signed)
Knee pain stable. Patient was advised to take Tylenol for knee pain. She should avoid all NSAIDs.

## 2010-11-23 NOTE — Progress Notes (Signed)
  Subjective:    Patient ID: Jennifer Hinton, female    DOB: Aug 03, 1935, 75 y.o.   MRN: [id number]  HPI Patient presents today for followup of hypertension and osteoarthritis. Hypertension: Patient states that she takes all her medications. She has no trouble affording her medications. She puts them in a pillbox and remembers to take them everyday the only medication she has trouble with it the hydralazine she takes that only twice a day. She has trouble remembering the lunchtime dose.  Knee osteoarthritis: Patient states that her knee pain is unchanged. She does not take any medication for this knee pain. She is able to walk as much as she wants to. She does sometimes have increasing pain at the end of the day. He denies swelling or heat.   Review of Systems Denies CP, SOB, HA, N/V/D, fever     Objective:   Physical Exam  Vital signs reviewed General appearance - alert, well appearing, and in no distress and oriented to person, place, and time Heart - normal rate, irregular rhythm, normal S1, S2, grade 2 systolic murmur Chest - clear to auscultation, no wheezes, rales or rhonchi, symmetric air entry, no tachypnea, retractions or cyanosis Abdomen - soft, nontender, nondistended, no masses or organomegaly Knees- crepitus in B knees, nontender to palpation no swelling is present no redness or heat       Assessment & Plan:  HYPERTENSION, MALIGNANT ESSENTIAL Patient with elevated blood pressure. We increased her Cardizem to 240 today. I spoke with Dr. Valentina Lucks about this patient. She will followup in pharmacy clinic in one to 2 weeks. She may be a candidate for an ambulatory blood pressure monitoring study.  OSTEOARTHRITIS, LOWER LEG Knee pain stable. Patient was advised to take Tylenol for knee pain. She should avoid all NSAIDs.

## 2010-11-24 LAB — BASIC METABOLIC PANEL
BUN: 18 mg/dL (ref 6–23)
Chloride: 107 mEq/L (ref 96–112)
Potassium: 3.2 mEq/L — ABNORMAL LOW (ref 3.5–5.3)

## 2010-11-28 ENCOUNTER — Ambulatory Visit (INDEPENDENT_AMBULATORY_CARE_PROVIDER_SITE_OTHER): Payer: Medicare Other | Admitting: *Deleted

## 2010-11-28 DIAGNOSIS — I4891 Unspecified atrial fibrillation: Secondary | ICD-10-CM

## 2010-12-11 ENCOUNTER — Other Ambulatory Visit: Payer: Self-pay | Admitting: Family Medicine

## 2010-12-11 NOTE — Telephone Encounter (Signed)
Refill request

## 2010-12-18 ENCOUNTER — Other Ambulatory Visit: Payer: Self-pay | Admitting: Family Medicine

## 2010-12-18 NOTE — Telephone Encounter (Signed)
Refill request

## 2010-12-26 ENCOUNTER — Ambulatory Visit (INDEPENDENT_AMBULATORY_CARE_PROVIDER_SITE_OTHER): Payer: Medicare Other | Admitting: *Deleted

## 2010-12-26 DIAGNOSIS — Z7901 Long term (current) use of anticoagulants: Secondary | ICD-10-CM

## 2010-12-26 DIAGNOSIS — I4891 Unspecified atrial fibrillation: Secondary | ICD-10-CM

## 2010-12-26 LAB — POCT INR: INR: 1.9

## 2011-01-23 ENCOUNTER — Ambulatory Visit (INDEPENDENT_AMBULATORY_CARE_PROVIDER_SITE_OTHER): Payer: Medicare Other | Admitting: *Deleted

## 2011-01-23 DIAGNOSIS — I4891 Unspecified atrial fibrillation: Secondary | ICD-10-CM

## 2011-01-23 DIAGNOSIS — Z7901 Long term (current) use of anticoagulants: Secondary | ICD-10-CM

## 2011-01-23 LAB — POCT INR: INR: 2.6

## 2011-02-20 ENCOUNTER — Ambulatory Visit (INDEPENDENT_AMBULATORY_CARE_PROVIDER_SITE_OTHER): Payer: Medicare Other | Admitting: *Deleted

## 2011-02-20 DIAGNOSIS — Z7901 Long term (current) use of anticoagulants: Secondary | ICD-10-CM

## 2011-02-20 DIAGNOSIS — I4891 Unspecified atrial fibrillation: Secondary | ICD-10-CM | POA: Diagnosis not present

## 2011-02-27 ENCOUNTER — Other Ambulatory Visit: Payer: Self-pay | Admitting: *Deleted

## 2011-02-27 MED ORDER — WARFARIN SODIUM 5 MG PO TABS: ORAL_TABLET | ORAL | Status: DC

## 2011-03-01 ENCOUNTER — Other Ambulatory Visit: Payer: Self-pay | Admitting: Cardiology

## 2011-03-14 ENCOUNTER — Other Ambulatory Visit: Payer: Self-pay | Admitting: Family Medicine

## 2011-03-14 NOTE — Telephone Encounter (Signed)
Refill request

## 2011-03-20 ENCOUNTER — Ambulatory Visit (INDEPENDENT_AMBULATORY_CARE_PROVIDER_SITE_OTHER): Payer: Medicare Other | Admitting: Pharmacist

## 2011-03-20 DIAGNOSIS — Z7901 Long term (current) use of anticoagulants: Secondary | ICD-10-CM | POA: Diagnosis not present

## 2011-03-20 DIAGNOSIS — I4891 Unspecified atrial fibrillation: Secondary | ICD-10-CM

## 2011-03-20 LAB — POCT INR: INR: 2.2

## 2011-05-01 ENCOUNTER — Ambulatory Visit (INDEPENDENT_AMBULATORY_CARE_PROVIDER_SITE_OTHER): Payer: Medicare Other | Admitting: *Deleted

## 2011-05-01 DIAGNOSIS — I4891 Unspecified atrial fibrillation: Secondary | ICD-10-CM

## 2011-05-01 DIAGNOSIS — Z7901 Long term (current) use of anticoagulants: Secondary | ICD-10-CM

## 2011-05-01 LAB — POCT INR: INR: 3.1

## 2011-05-29 ENCOUNTER — Other Ambulatory Visit: Payer: Self-pay | Admitting: Family Medicine

## 2011-05-29 ENCOUNTER — Other Ambulatory Visit: Payer: Self-pay | Admitting: Cardiology

## 2011-06-12 ENCOUNTER — Ambulatory Visit (INDEPENDENT_AMBULATORY_CARE_PROVIDER_SITE_OTHER): Payer: Medicare Other | Admitting: Pharmacist

## 2011-06-12 DIAGNOSIS — I4891 Unspecified atrial fibrillation: Secondary | ICD-10-CM

## 2011-06-12 DIAGNOSIS — Z7901 Long term (current) use of anticoagulants: Secondary | ICD-10-CM

## 2011-06-16 ENCOUNTER — Other Ambulatory Visit: Payer: Self-pay | Admitting: Family Medicine

## 2011-06-22 ENCOUNTER — Other Ambulatory Visit: Payer: Self-pay | Admitting: Family Medicine

## 2011-07-01 ENCOUNTER — Other Ambulatory Visit: Payer: Self-pay | Admitting: Cardiology

## 2011-07-01 MED ORDER — WARFARIN SODIUM 5 MG PO TABS: ORAL_TABLET | ORAL | Status: DC

## 2011-07-03 ENCOUNTER — Other Ambulatory Visit: Payer: Self-pay | Admitting: Family Medicine

## 2011-07-03 MED ORDER — DOXAZOSIN MESYLATE 8 MG PO TABS: 8.0000 mg | ORAL_TABLET | Freq: Every day | ORAL | Status: DC

## 2011-07-24 ENCOUNTER — Ambulatory Visit (INDEPENDENT_AMBULATORY_CARE_PROVIDER_SITE_OTHER): Payer: Medicare Other | Admitting: *Deleted

## 2011-07-24 DIAGNOSIS — I4891 Unspecified atrial fibrillation: Secondary | ICD-10-CM | POA: Diagnosis not present

## 2011-07-24 DIAGNOSIS — Z7901 Long term (current) use of anticoagulants: Secondary | ICD-10-CM | POA: Diagnosis not present

## 2011-07-24 LAB — POCT INR: INR: 2.2

## 2011-07-25 ENCOUNTER — Other Ambulatory Visit: Payer: Self-pay | Admitting: Family Medicine

## 2011-09-04 ENCOUNTER — Ambulatory Visit (INDEPENDENT_AMBULATORY_CARE_PROVIDER_SITE_OTHER): Payer: Medicare Other | Admitting: *Deleted

## 2011-09-04 DIAGNOSIS — Z7901 Long term (current) use of anticoagulants: Secondary | ICD-10-CM

## 2011-09-04 DIAGNOSIS — I4891 Unspecified atrial fibrillation: Secondary | ICD-10-CM | POA: Diagnosis not present

## 2011-09-04 LAB — POCT INR: INR: 2.2

## 2011-09-10 ENCOUNTER — Encounter: Payer: Self-pay | Admitting: Family Medicine

## 2011-09-10 ENCOUNTER — Ambulatory Visit (INDEPENDENT_AMBULATORY_CARE_PROVIDER_SITE_OTHER): Payer: Medicare Other | Admitting: Family Medicine

## 2011-09-10 VITALS — BP 130/78 | HR 61 | Ht 65.5 in | Wt 179.0 lb

## 2011-09-10 DIAGNOSIS — E669 Obesity, unspecified: Secondary | ICD-10-CM

## 2011-09-10 DIAGNOSIS — D179 Benign lipomatous neoplasm, unspecified: Secondary | ICD-10-CM | POA: Diagnosis not present

## 2011-09-10 DIAGNOSIS — IMO0002 Reserved for concepts with insufficient information to code with codable children: Secondary | ICD-10-CM | POA: Diagnosis not present

## 2011-09-10 DIAGNOSIS — I10 Essential (primary) hypertension: Secondary | ICD-10-CM

## 2011-09-10 DIAGNOSIS — M109 Gout, unspecified: Secondary | ICD-10-CM | POA: Diagnosis not present

## 2011-09-10 DIAGNOSIS — M1710 Unilateral primary osteoarthritis, unspecified knee: Secondary | ICD-10-CM

## 2011-09-10 LAB — CBC
Hemoglobin: 11.8 g/dL — ABNORMAL LOW (ref 12.0–15.0)
MCH: 31 pg (ref 26.0–34.0)
MCHC: 33.2 g/dL (ref 30.0–36.0)
MCV: 93.2 fL (ref 78.0–100.0)
RBC: 3.81 MIL/uL — ABNORMAL LOW (ref 3.87–5.11)

## 2011-09-10 NOTE — Patient Instructions (Signed)
Mrs. Fouty,   Thank you very much for coming in to see me today. 1. For HTN: Continue all current medications as prescribed.  2. For arthritis: continue tylenol as needed.  3. Collect stool cards and return to clinic.  4. Think about zostax.  5. Come back in the fall for your flu shot: nurse visit.   F/u in 6 months or sooner if needed.   Dr. Adrian Blackwater

## 2011-09-10 NOTE — Assessment & Plan Note (Signed)
Slow growing. Non tender. Adequate perfusion. Will follow.

## 2011-09-10 NOTE — Assessment & Plan Note (Signed)
Improving. Purposeful weight loss.

## 2011-09-10 NOTE — Assessment & Plan Note (Signed)
A: well controlled with tylenol therapy. Gout may be contributing to flares. P: Continue tylenol Check uric acid level.

## 2011-09-10 NOTE — Progress Notes (Signed)
Subjective:     Patient ID: Jennifer Hinton, female   DOB: [birth year], 76 y.o.   MRN: [id number]  HPI 76 yo F presents for f/u to discuss the following:  1. HTN: taking medications as prescribed. Brought in pills. Denies CP, SOB, headache and presyncope. Denies LE edema. Purposeful weight loss.   2. R knee pain: intermittent. Has history of gout, but last flare up in 2000. R knee chronically large than L knee. Occasionally swelling, no redness. Takes tylenol as needed for pain. Does not have to take it daily.   3. Health maintenance: Due for zostavax (decliens at this time). Due for colonoscopy (declines at this time. Has agreed to do home stool cards).   Review of Systems As per HPI  Objective:   Physical Exam BP 130/78  Pulse 61  Ht 5' 5.5" (1.664 m)  Wt 179 lb (81.194 kg)  BMI 29.33 kg/m2 General appearance: alert, cooperative and no distress Throat: lips, mucosa, and tongue normal; teeth and gums normal Neck: no adenopathy, no carotid bruit, no JVD, supple, symmetrical, trachea midline and thyroid not enlarged, symmetric, no tenderness/mass/nodules Lungs: clear to auscultation bilaterally Heart: regular rate and rhythm, S1, S2 normal, no murmur, click, rub or gallop Extremities: extremities normal, atraumatic, no cyanosis or edema R knee> L, non tender, no effusion or erythema. Large 4x 4 cm fibrous mass on L upper extremity.  Pulses: 2+ and symmetric Neurologic: Grossly normal  Assessment and Plan:

## 2011-09-10 NOTE — Assessment & Plan Note (Signed)
A: well controlled.  Med: compliant w/o side effect. P: -continue current regimen -check lab work: CMP ( K and Cr)  -dLDL non fasting

## 2011-09-11 ENCOUNTER — Encounter: Payer: Self-pay | Admitting: Family Medicine

## 2011-09-11 LAB — COMPREHENSIVE METABOLIC PANEL
AST: 25 U/L (ref 0–37)
Albumin: 3.7 g/dL (ref 3.5–5.2)
Alkaline Phosphatase: 53 U/L (ref 39–117)
BUN: 26 mg/dL — ABNORMAL HIGH (ref 6–23)
Glucose, Bld: 96 mg/dL (ref 70–99)
Potassium: 4 mEq/L (ref 3.5–5.3)
Sodium: 139 mEq/L (ref 135–145)
Total Bilirubin: 0.5 mg/dL (ref 0.3–1.2)
Total Protein: 7.3 g/dL (ref 6.0–8.3)

## 2011-09-11 LAB — LDL CHOLESTEROL, DIRECT: Direct LDL: 78 mg/dL

## 2011-10-16 ENCOUNTER — Ambulatory Visit (INDEPENDENT_AMBULATORY_CARE_PROVIDER_SITE_OTHER): Payer: Medicare Other | Admitting: *Deleted

## 2011-10-16 DIAGNOSIS — I4891 Unspecified atrial fibrillation: Secondary | ICD-10-CM

## 2011-10-16 DIAGNOSIS — Z7901 Long term (current) use of anticoagulants: Secondary | ICD-10-CM

## 2011-10-16 LAB — POCT INR: INR: 1.9

## 2011-10-17 ENCOUNTER — Other Ambulatory Visit: Payer: Self-pay | Admitting: Family Medicine

## 2011-11-13 ENCOUNTER — Ambulatory Visit (INDEPENDENT_AMBULATORY_CARE_PROVIDER_SITE_OTHER): Payer: Medicare Other | Admitting: *Deleted

## 2011-11-13 DIAGNOSIS — Z7901 Long term (current) use of anticoagulants: Secondary | ICD-10-CM | POA: Diagnosis not present

## 2011-11-13 DIAGNOSIS — I4891 Unspecified atrial fibrillation: Secondary | ICD-10-CM | POA: Diagnosis not present

## 2011-11-13 LAB — POCT INR: INR: 2.5

## 2011-12-11 ENCOUNTER — Ambulatory Visit (INDEPENDENT_AMBULATORY_CARE_PROVIDER_SITE_OTHER): Payer: Medicare Other | Admitting: *Deleted

## 2011-12-11 DIAGNOSIS — I4891 Unspecified atrial fibrillation: Secondary | ICD-10-CM | POA: Diagnosis not present

## 2011-12-11 DIAGNOSIS — Z7901 Long term (current) use of anticoagulants: Secondary | ICD-10-CM

## 2011-12-11 LAB — POCT INR: INR: 2.3

## 2012-01-08 ENCOUNTER — Ambulatory Visit (INDEPENDENT_AMBULATORY_CARE_PROVIDER_SITE_OTHER): Payer: Medicare Other | Admitting: *Deleted

## 2012-01-08 DIAGNOSIS — Z7901 Long term (current) use of anticoagulants: Secondary | ICD-10-CM | POA: Diagnosis not present

## 2012-01-08 DIAGNOSIS — I4891 Unspecified atrial fibrillation: Secondary | ICD-10-CM | POA: Diagnosis not present

## 2012-01-08 LAB — POCT INR: INR: 2

## 2012-01-20 ENCOUNTER — Other Ambulatory Visit: Payer: Self-pay | Admitting: Family Medicine

## 2012-02-03 ENCOUNTER — Other Ambulatory Visit: Payer: Self-pay | Admitting: Cardiology

## 2012-02-03 ENCOUNTER — Other Ambulatory Visit: Payer: Self-pay | Admitting: Family Medicine

## 2012-02-03 NOTE — Telephone Encounter (Signed)
Please make office visit for additional refills.

## 2012-02-10 ENCOUNTER — Other Ambulatory Visit: Payer: Self-pay | Admitting: Family Medicine

## 2012-02-17 ENCOUNTER — Ambulatory Visit (INDEPENDENT_AMBULATORY_CARE_PROVIDER_SITE_OTHER): Payer: Medicare Other | Admitting: *Deleted

## 2012-02-17 ENCOUNTER — Other Ambulatory Visit: Payer: Self-pay | Admitting: Family Medicine

## 2012-02-17 DIAGNOSIS — Z7901 Long term (current) use of anticoagulants: Secondary | ICD-10-CM

## 2012-02-17 DIAGNOSIS — I4891 Unspecified atrial fibrillation: Secondary | ICD-10-CM

## 2012-02-17 LAB — POCT INR: INR: 2.5

## 2012-02-17 MED ORDER — HYDRALAZINE HCL 25 MG PO TABS: 25.0000 mg | ORAL_TABLET | Freq: Three times a day (TID) | ORAL | Status: DC

## 2012-02-17 NOTE — Telephone Encounter (Signed)
Called patient. Reviewed med rec. Will refill hydralazine.

## 2012-03-30 ENCOUNTER — Ambulatory Visit (INDEPENDENT_AMBULATORY_CARE_PROVIDER_SITE_OTHER): Payer: Medicare Other | Admitting: *Deleted

## 2012-03-30 DIAGNOSIS — I4891 Unspecified atrial fibrillation: Secondary | ICD-10-CM | POA: Diagnosis not present

## 2012-03-30 DIAGNOSIS — Z7901 Long term (current) use of anticoagulants: Secondary | ICD-10-CM | POA: Diagnosis not present

## 2012-04-09 ENCOUNTER — Other Ambulatory Visit: Payer: Self-pay | Admitting: Family Medicine

## 2012-04-09 NOTE — Telephone Encounter (Signed)
Received refill request for cardura.  Called patient. Her BP and wt are both trending down Plan to decrease cardura from 8 to 4 mg nightly.  Plan to titrate down to off.

## 2012-05-11 ENCOUNTER — Other Ambulatory Visit: Payer: Self-pay | Admitting: Family Medicine

## 2012-05-13 ENCOUNTER — Ambulatory Visit (INDEPENDENT_AMBULATORY_CARE_PROVIDER_SITE_OTHER): Payer: Medicare Other | Admitting: *Deleted

## 2012-05-13 DIAGNOSIS — I4891 Unspecified atrial fibrillation: Secondary | ICD-10-CM

## 2012-05-13 DIAGNOSIS — Z7901 Long term (current) use of anticoagulants: Secondary | ICD-10-CM | POA: Diagnosis not present

## 2012-05-13 LAB — POCT INR: INR: 2.3

## 2012-06-08 ENCOUNTER — Other Ambulatory Visit: Payer: Self-pay | Admitting: Cardiology

## 2012-06-24 ENCOUNTER — Ambulatory Visit (INDEPENDENT_AMBULATORY_CARE_PROVIDER_SITE_OTHER): Payer: Medicare Other | Admitting: *Deleted

## 2012-06-24 DIAGNOSIS — I4891 Unspecified atrial fibrillation: Secondary | ICD-10-CM

## 2012-06-24 DIAGNOSIS — Z7901 Long term (current) use of anticoagulants: Secondary | ICD-10-CM

## 2012-06-24 LAB — POCT INR: INR: 2.7

## 2012-07-31 ENCOUNTER — Emergency Department (INDEPENDENT_AMBULATORY_CARE_PROVIDER_SITE_OTHER)
Admission: EM | Admit: 2012-07-31 | Discharge: 2012-07-31 | Disposition: A | Discharge status: Home / Self Care | Payer: Medicare Other | Source: Home / Self Care

## 2012-07-31 ENCOUNTER — Encounter (HOSPITAL_COMMUNITY): Payer: Self-pay | Admitting: Emergency Medicine

## 2012-07-31 DIAGNOSIS — M109 Gout, unspecified: Secondary | ICD-10-CM

## 2012-07-31 MED ORDER — NAPROXEN 500 MG PO TABS: 500.0000 mg | ORAL_TABLET | Freq: Three times a day (TID) | ORAL | Status: DC

## 2012-07-31 MED ORDER — PREDNISONE (PAK) 10 MG PO TABS: 40.0000 mg | ORAL_TABLET | Freq: Every day | ORAL | Status: DC

## 2012-07-31 NOTE — ED Provider Notes (Signed)
History     CSN: [id number]  Arrival date & time 07/31/12  1202   None     Chief Complaint  Patient presents with  . Wrist Pain    (Consider location/radiation/quality/duration/timing/severity/associated sxs/prior treatment) Patient is a 77 y.o. female presenting with wrist pain.  Wrist Pain   this is a 77 year old female who presents with pain and swelling of her left wrist beginning to 3 days ago. Patient states that she did not injure her wrist the area is quite red painful and swollen. She does have a history of gout as well. She has not had any fevers or  Past Medical History  Diagnosis Date  . Atrial fibrillation   . Hyperlipidemia   . Hypertension     History reviewed. No pertinent past surgical history.  Family History  Problem Relation Age of Onset  . Heart disease Brother   . Hyperlipidemia Brother     History  Substance Use Topics  . Smoking status: Never Smoker   . Smokeless tobacco: Never Used  . Alcohol Use: No    OB History   Grav Para Term Preterm Abortions TAB SAB Ect Mult Living                  Review of Systems  Constitutional: Negative.   HENT: Negative.   Eyes: Negative.   Respiratory: Negative.   Cardiovascular: Negative.   Gastrointestinal: Negative.   Genitourinary: Negative.   Musculoskeletal:       Per HPI  Skin: Negative.   Neurological: Negative.   Hematological: Bruises/bleeds easily.  Psychiatric/Behavioral: Negative.     Allergies  Review of patient's allergies indicates no known allergies.  Home Medications   Current Outpatient Rx  Name  Route  Sig  Dispense  Refill  . acetaminophen (TYLENOL) 650 MG CR tablet      One tab by mouth q4-6h as needed pain in knee          . aspirin 81 MG tablet   Oral   Take 81 mg by mouth daily.           Marland Kitchen diltiazem (CARDIZEM CD) 240 MG 24 hr capsule      TAKE 1 CAPSULE (240 MG TOTAL) BY MOUTH DAILY.   30 capsule   6   . doxazosin (CARDURA) 4 MG tablet   Oral  Take 1 tablet (4 mg total) by mouth at bedtime.   30 tablet   2   . enalapril (VASOTEC) 20 MG tablet      TAKE 1 TABLET BY MOUTH TWICE A DAY   60 tablet   6   . hydrALAZINE (APRESOLINE) 25 MG tablet   Oral   Take 1 tablet (25 mg total) by mouth 3 (three) times daily.   90 tablet   6   . hydrALAZINE (APRESOLINE) 25 MG tablet      TAKE 1 TABLET BY MOUTH 3 TIMES A DAY (FOR BLOOD PRESSURE)   90 tablet   1   . metoprolol (LOPRESSOR) 100 MG tablet      TAKE 2 TABLETS BY MOUTH TWICE A DAY   120 tablet   6   . predniSONE (STERAPRED UNI-PAK) 10 MG tablet   Oral   Take 4 tablets (40 mg total) by mouth daily.   10 tablet   0   . warfarin (COUMADIN) 5 MG tablet      TAKE AS DIRECTED BY ANTICOAGULATION CLINIC   90 tablet   1  BP 195/85  Pulse 65  Temp(Src) 98 F (36.7 C) (Oral)  Resp 19  SpO2 99%  Physical Exam  Constitutional: She is oriented to person, place, and time. She appears well-developed and well-nourished.  HENT:  Head: Normocephalic and atraumatic.  Eyes: Conjunctivae are normal. Pupils are equal, round, and reactive to light.  Neck: Normal range of motion. Neck supple.  Cardiovascular: Normal rate and regular rhythm.   Pulmonary/Chest: Effort normal and breath sounds normal.  Abdominal: Soft. Bowel sounds are normal.  Musculoskeletal: She exhibits edema and tenderness.  Edema erythema warmth and significant tenderness just distal to the wrist on the dorsum of the right hand. Extension of her fingers exacerbates the pain. Motion at the wrist is within normal limits.  Neurological: She is alert and oriented to person, place, and time.  Skin: Skin is warm and dry. There is erythema.  Psychiatric: She has a normal mood and affect. Her behavior is normal. Thought content normal.    ED Course  Procedures (including critical care time)  Labs Reviewed - No data to display No results found.   1. Gout       MDM  As she has a history of gout, at  this point I will go ahead and treat it as a gout flare with 5 days of Naprosyn. This may also be a cellulitis. The patient is advised that if worsening occurs she is to return to urgent care immediately.        Debbe Odea, MD 07/31/12 1324

## 2012-07-31 NOTE — ED Notes (Signed)
Pt c/o right wirst pain onset Wednesday that's gradually getting worse.  Sxs include: swelling, pain... Hx of gout and arthritis Denies: fevers, inj/trauma He is alert and oriented w/no signs of acute distress.

## 2012-08-04 ENCOUNTER — Other Ambulatory Visit: Payer: Self-pay | Admitting: Family Medicine

## 2012-08-05 ENCOUNTER — Ambulatory Visit (INDEPENDENT_AMBULATORY_CARE_PROVIDER_SITE_OTHER): Payer: Medicare Other | Admitting: *Deleted

## 2012-08-05 DIAGNOSIS — I4891 Unspecified atrial fibrillation: Secondary | ICD-10-CM | POA: Diagnosis not present

## 2012-08-05 DIAGNOSIS — Z7901 Long term (current) use of anticoagulants: Secondary | ICD-10-CM | POA: Diagnosis not present

## 2012-09-02 ENCOUNTER — Ambulatory Visit (INDEPENDENT_AMBULATORY_CARE_PROVIDER_SITE_OTHER): Payer: Medicare Other | Admitting: *Deleted

## 2012-09-02 DIAGNOSIS — Z7901 Long term (current) use of anticoagulants: Secondary | ICD-10-CM

## 2012-09-02 DIAGNOSIS — I4891 Unspecified atrial fibrillation: Secondary | ICD-10-CM | POA: Diagnosis not present

## 2012-09-02 LAB — POCT INR: INR: 2.5

## 2012-09-05 ENCOUNTER — Other Ambulatory Visit: Payer: Self-pay | Admitting: Family Medicine

## 2012-09-07 NOTE — Telephone Encounter (Signed)
Called pt and left message stating that she needs to make an appointment regarding review of her medications and chronic issues - please call office for an appointment. Tildon Husky, RN-BSN

## 2012-09-07 NOTE — Telephone Encounter (Signed)
I have refilled the patients medications. She should really be seen to discuss her chronic issues for which she is on diltiazem. Please inform the patient.

## 2012-09-18 ENCOUNTER — Other Ambulatory Visit: Payer: Self-pay | Admitting: *Deleted

## 2012-09-18 MED ORDER — HYDRALAZINE HCL 25 MG PO TABS: ORAL_TABLET | ORAL | Status: DC

## 2012-09-18 MED ORDER — ENALAPRIL MALEATE 20 MG PO TABS: ORAL_TABLET | ORAL | Status: DC

## 2012-09-18 NOTE — Telephone Encounter (Signed)
Given one month of refills. Patient has not been seen in clinic in over a year. She should come in for a f/u prior to additional refills.

## 2012-09-21 NOTE — Telephone Encounter (Signed)
Has appt on the 18th. Jennifer Hinton, Jennifer Hinton

## 2012-09-22 ENCOUNTER — Other Ambulatory Visit: Payer: Self-pay | Admitting: *Deleted

## 2012-09-22 MED ORDER — ENALAPRIL MALEATE 20 MG PO TABS: ORAL_TABLET | ORAL | Status: DC

## 2012-09-22 NOTE — Telephone Encounter (Signed)
Will refill this. Patient will need to come in for an office visit as has not been seen in the clinic in over a year.

## 2012-09-23 ENCOUNTER — Other Ambulatory Visit: Payer: Self-pay | Admitting: *Deleted

## 2012-09-24 ENCOUNTER — Other Ambulatory Visit: Payer: Self-pay | Admitting: *Deleted

## 2012-09-24 MED ORDER — WARFARIN SODIUM 5 MG PO TABS: ORAL_TABLET | ORAL | Status: DC

## 2012-09-25 ENCOUNTER — Telehealth: Payer: Self-pay | Admitting: Family Medicine

## 2012-09-25 NOTE — Telephone Encounter (Signed)
LMOVM for callback.Clinton Sawyer, Salome Spotted

## 2012-09-25 NOTE — Telephone Encounter (Signed)
Received faxed request for medication refills. Patient will need to wait until her f/u appointment to get these refills. If she needs enough to get her to the appointment please let me know. Please advise patient.

## 2012-09-29 ENCOUNTER — Other Ambulatory Visit: Payer: Self-pay | Admitting: *Deleted

## 2012-09-30 MED ORDER — METOPROLOL TARTRATE 100 MG PO TABS: ORAL_TABLET | ORAL | Status: DC

## 2012-10-05 ENCOUNTER — Encounter: Payer: Self-pay | Admitting: Family Medicine

## 2012-10-05 ENCOUNTER — Ambulatory Visit (INDEPENDENT_AMBULATORY_CARE_PROVIDER_SITE_OTHER): Payer: Medicare Other | Admitting: Family Medicine

## 2012-10-05 VITALS — BP 186/74 | HR 71 | Temp 98.0°F | Wt 179.0 lb

## 2012-10-05 DIAGNOSIS — I10 Essential (primary) hypertension: Secondary | ICD-10-CM

## 2012-10-05 DIAGNOSIS — H9319 Tinnitus, unspecified ear: Secondary | ICD-10-CM | POA: Insufficient documentation

## 2012-10-05 DIAGNOSIS — I4891 Unspecified atrial fibrillation: Secondary | ICD-10-CM

## 2012-10-05 LAB — BASIC METABOLIC PANEL
Calcium: 9.2 mg/dL (ref 8.4–10.5)
Creat: 0.99 mg/dL (ref 0.50–1.10)
Sodium: 138 mEq/L (ref 135–145)

## 2012-10-05 MED ORDER — ENALAPRIL MALEATE 20 MG PO TABS: ORAL_TABLET | ORAL | Status: DC

## 2012-10-05 MED ORDER — HYDRALAZINE HCL 25 MG PO TABS: ORAL_TABLET | ORAL | Status: DC

## 2012-10-05 NOTE — Assessment & Plan Note (Signed)
Patient with ringing in ears intermittently without hearing loss or dizziness. Potentially related to sensorineural hearing loss vs aspirin use. Given does not occur on a weekly basis, will plan to follow at this time. F/u in 4 weeks to check on status of this.

## 2012-10-05 NOTE — Patient Instructions (Addendum)
Nice to meet you. Your blood pressure is still a little high. I would like to increase the number of times you take the hydralazine per day to 4 times daily. We will also get some electrolytes from you. I want to see you back in a bout a month to make sure your blood pressure is doing ok.

## 2012-10-05 NOTE — Progress Notes (Signed)
  Subjective:    Patient ID: Jennifer Hinton, female    DOB: December 14, 1935, 77 y.o.   MRN: [id number]  HPI HYPERTENSION Disease Monitoring: Blood pressure range-not checking at home Chest pain, palpitations- no      Dyspnea- no Medications: Compliance- taking Lightheadedness,Syncope- no   Edema- no  Afib: on coumadin. Monitored at coumadin clinic. No complaints of palpitations. No abnormal bleeding.  Ringing in ear: occurs intermittently. Not every week. Denies dizziness with this. Occurs at night. No change in hearing. Notably takes aspirin.  PMH Smoking Status noted    Review of Systems see HPI     Objective:   Physical Exam  Constitutional: She appears well-developed and well-nourished.  HENT:  Head: Normocephalic and atraumatic.  Eyes: Conjunctivae are normal. Pupils are equal, round, and reactive to light.  Neck: Neck supple.  Cardiovascular:  Regular rate, irregular rhythm, 2/6 systolic murmur  Pulmonary/Chest: Effort normal and breath sounds normal.  Musculoskeletal: She exhibits no edema.  Lymphadenopathy:    She has no cervical adenopathy.  Neurological: She is alert.  Skin: Skin is warm and dry.  BP 186/74  Pulse 71  Temp(Src) 98 F (36.7 C) (Oral)  Wt 179 lb (81.194 kg)  BMI 29.32 kg/m2 Repeat BP 160/74     Assessment & Plan:

## 2012-10-05 NOTE — Assessment & Plan Note (Signed)
Not at goal, with last 2 BP readings above goal. Is compliant without side effects. Plan: continue enalapril, diltiazem, and metoprolol. Increase Hydralazine to 25 mg 4 times a day. Will check BMET. F/u in 4 weeks.

## 2012-10-05 NOTE — Assessment & Plan Note (Signed)
Rate controlled. On coumadin. Followed at [facility]. Plan: continue current management.

## 2012-10-07 ENCOUNTER — Encounter: Payer: Self-pay | Admitting: *Deleted

## 2012-10-14 ENCOUNTER — Ambulatory Visit (INDEPENDENT_AMBULATORY_CARE_PROVIDER_SITE_OTHER): Payer: Medicare Other | Admitting: *Deleted

## 2012-10-14 DIAGNOSIS — Z7901 Long term (current) use of anticoagulants: Secondary | ICD-10-CM | POA: Diagnosis not present

## 2012-10-14 DIAGNOSIS — I4891 Unspecified atrial fibrillation: Secondary | ICD-10-CM | POA: Diagnosis not present

## 2012-10-14 LAB — POCT INR: INR: 2.1

## 2012-10-20 ENCOUNTER — Other Ambulatory Visit: Payer: Self-pay | Admitting: Family Medicine

## 2012-11-18 ENCOUNTER — Other Ambulatory Visit: Payer: Self-pay | Admitting: Family Medicine

## 2012-11-18 MED ORDER — ENALAPRIL MALEATE 20 MG PO TABS: ORAL_TABLET | ORAL | Status: DC

## 2012-11-25 ENCOUNTER — Ambulatory Visit (INDEPENDENT_AMBULATORY_CARE_PROVIDER_SITE_OTHER): Payer: Medicare Other | Admitting: *Deleted

## 2012-11-25 DIAGNOSIS — Z7901 Long term (current) use of anticoagulants: Secondary | ICD-10-CM

## 2012-11-25 DIAGNOSIS — I4891 Unspecified atrial fibrillation: Secondary | ICD-10-CM

## 2012-11-25 LAB — POCT INR: INR: 2.6

## 2013-01-06 ENCOUNTER — Ambulatory Visit (INDEPENDENT_AMBULATORY_CARE_PROVIDER_SITE_OTHER): Payer: Medicare Other | Admitting: *Deleted

## 2013-01-06 DIAGNOSIS — I4891 Unspecified atrial fibrillation: Secondary | ICD-10-CM

## 2013-01-06 DIAGNOSIS — Z7901 Long term (current) use of anticoagulants: Secondary | ICD-10-CM

## 2013-01-06 LAB — POCT INR: INR: 3.6

## 2013-01-17 ENCOUNTER — Other Ambulatory Visit: Payer: Self-pay | Admitting: Family Medicine

## 2013-01-17 NOTE — Telephone Encounter (Signed)
One month of refills ordered. Patient needs to come in for a follow-up as she was supposed to do this 2 months ago. Please advise the patient.

## 2013-01-18 NOTE — Telephone Encounter (Signed)
Pt informed.  She will call back to schedule. Koi Yarbro, Salome Spotted

## 2013-01-20 ENCOUNTER — Ambulatory Visit (INDEPENDENT_AMBULATORY_CARE_PROVIDER_SITE_OTHER): Payer: Medicare Other | Admitting: *Deleted

## 2013-01-20 DIAGNOSIS — Z7901 Long term (current) use of anticoagulants: Secondary | ICD-10-CM | POA: Diagnosis not present

## 2013-01-20 DIAGNOSIS — I4891 Unspecified atrial fibrillation: Secondary | ICD-10-CM

## 2013-01-20 LAB — POCT INR: INR: 2.6

## 2013-01-26 ENCOUNTER — Other Ambulatory Visit: Payer: Self-pay | Admitting: Family Medicine

## 2013-01-26 NOTE — Telephone Encounter (Signed)
Patient needs a follow-up appointment given that it has been >3 months since she was last seen.

## 2013-01-26 NOTE — Telephone Encounter (Signed)
Pt is aware.  She will call back to make an appt since she has to find a ride first. [employer]

## 2013-02-09 ENCOUNTER — Ambulatory Visit (INDEPENDENT_AMBULATORY_CARE_PROVIDER_SITE_OTHER): Payer: Medicare Other | Admitting: Pharmacist

## 2013-02-09 DIAGNOSIS — I4891 Unspecified atrial fibrillation: Secondary | ICD-10-CM | POA: Diagnosis not present

## 2013-02-09 DIAGNOSIS — Z7901 Long term (current) use of anticoagulants: Secondary | ICD-10-CM | POA: Diagnosis not present

## 2013-02-09 LAB — POCT INR: INR: 2.3

## 2013-03-03 ENCOUNTER — Ambulatory Visit: Payer: Medicare Other | Admitting: Family Medicine

## 2013-03-07 ENCOUNTER — Other Ambulatory Visit: Payer: Self-pay | Admitting: Family Medicine

## 2013-03-08 ENCOUNTER — Ambulatory Visit: Payer: Medicare Other | Admitting: Family Medicine

## 2013-03-09 MED ORDER — DOXAZOSIN MESYLATE 4 MG PO TABS: 4.0000 mg | ORAL_TABLET | Freq: Every day | ORAL | Status: DC

## 2013-03-09 NOTE — Telephone Encounter (Signed)
Refills ordered for Cardura and enalapril, 1 month supply with no refills. Pt needs appt with PCP Dr. Caryl Bis for any further refills. Please call pt to relay this information to her. (Note [facility] stated their system is down so it may take a day or two for her Rx's to be ready). Thanks. --CMS

## 2013-03-10 ENCOUNTER — Ambulatory Visit (INDEPENDENT_AMBULATORY_CARE_PROVIDER_SITE_OTHER): Payer: Medicare Other | Admitting: *Deleted

## 2013-03-10 DIAGNOSIS — I4891 Unspecified atrial fibrillation: Secondary | ICD-10-CM | POA: Diagnosis not present

## 2013-03-10 DIAGNOSIS — Z7901 Long term (current) use of anticoagulants: Secondary | ICD-10-CM

## 2013-03-10 DIAGNOSIS — Z5181 Encounter for therapeutic drug level monitoring: Secondary | ICD-10-CM

## 2013-03-10 LAB — POCT INR: INR: 2.6

## 2013-03-18 ENCOUNTER — Ambulatory Visit (INDEPENDENT_AMBULATORY_CARE_PROVIDER_SITE_OTHER): Payer: Medicare Other | Admitting: Family Medicine

## 2013-03-18 ENCOUNTER — Encounter: Payer: Self-pay | Admitting: Family Medicine

## 2013-03-18 VITALS — BP 168/84 | HR 76 | Temp 97.5°F | Wt 174.4 lb

## 2013-03-18 DIAGNOSIS — I4891 Unspecified atrial fibrillation: Secondary | ICD-10-CM

## 2013-03-18 DIAGNOSIS — I10 Essential (primary) hypertension: Secondary | ICD-10-CM

## 2013-03-18 DIAGNOSIS — Z23 Encounter for immunization: Secondary | ICD-10-CM | POA: Diagnosis not present

## 2013-03-18 LAB — BASIC METABOLIC PANEL
BUN: 20 mg/dL (ref 6–23)
CHLORIDE: 107 meq/L (ref 96–112)
CO2: 25 mEq/L (ref 19–32)
Calcium: 9.6 mg/dL (ref 8.4–10.5)
Creat: 1.02 mg/dL (ref 0.50–1.10)
Glucose, Bld: 102 mg/dL — ABNORMAL HIGH (ref 70–99)
POTASSIUM: 3.8 meq/L (ref 3.5–5.3)
SODIUM: 145 meq/L (ref 135–145)

## 2013-03-18 MED ORDER — HYDRALAZINE HCL 25 MG PO TABS: ORAL_TABLET | ORAL | Status: DC

## 2013-03-18 NOTE — Progress Notes (Signed)
   Subjective:    Patient ID: Jennifer Hinton, female    DOB: 21-Feb-1935, 78 y.o.   MRN: [id number]  HPI 78 yo F with chronic A fib presents for f/u visit: She has no acute complaints:  1. HTN: complaint with all meds excepts admits to taking hydralazine BID only. Denies HA, vision changes, CP, SOB, LE edema, fatigue.  2. A fib: compliant with coumadin and INR checks. She denies bleeding and bruising.   3. Weight loss: losing weight on purpose. Eating well. Happy with results.   4. HM: will take flu shot today. Age 78. W/o personal or family history of breast or colon cancer.   History   Social History  . Marital Status: Widowed    Spouse Name: N/A    Number of Children: 6  . Years of Education: 9th grade    Occupational History  . 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 Smoking status: Never Smoker   . Smokeless tobacco: Never Used  . Alcohol Use: No  . Drug Use: No  . Sexual Activity: Not on file   Other Topics Concern  . Not on file   Social History Narrative   Had 6 children.   5 children living (oldest daughter passed away).    2 living in [place], 2 living in [place], 1 living in [place].       Raising your 9 yo great grandson Dorris Fetch, also a clinic patient).    Drives. Recently renewed license. Does not own a car so transportation is sometimes a limiting factor.              Review of Systems  Constitutional: Negative.   Gastrointestinal: Negative.   Skin: Negative.   Hematological: Negative.   As per HPI      Objective:   Physical Exam BP 168/84 (initial reading 181/92)  Pulse 76  Temp(Src) 97.5 F (36.4 C) (Oral)  Wt 174 lb 6.4 oz (79.107 kg) BP Readings from Last 3 Encounters:  03/18/13 168/84  10/05/12 186/74  07/31/12 195/85    Wt Readings from Last 3 Encounters:  03/18/13 174 lb 6.4 oz (79.107 kg)  10/05/12 179 lb (81.194 kg)  09/10/11 179 lb (81.194 kg)  General appearance: alert,  cooperative and no distress Lungs: clear to auscultation bilaterally Breasts: Inspection negative Heart: irregularly irregular rhythm Extremities: extremities normal, atraumatic, no cyanosis or edema  Lab Results  Component Value Date   INR 2.6 03/10/2013   INR 2.3 02/09/2013   INR 2.6 01/20/2013   PROTIME 18.3 07/20/2008     Chemistry      Component Value Date/Time   NA 145 03/18/2013 1019   K 3.8 03/18/2013 1019   CL 107 03/18/2013 1019   CO2 25 03/18/2013 1019   BUN 20 03/18/2013 1019   CREATININE 1.02 03/18/2013 1019   CREATININE 0.98 05/31/2009 1813      Component Value Date/Time   CALCIUM 9.6 03/18/2013 1019         Assessment & Plan:

## 2013-03-18 NOTE — Patient Instructions (Signed)
Mrs. Pendergast,  Thank you very much for coming in today.  I will be in touch with blood work results and plan for your blood pressure medicine. Change today is hydralazine three times daily, breakfast, lunch and dinner.   Plan to f/u your blood pressure in 4 weeks with Dr. Caryl Bis.   Dr. Adrian Blackwater

## 2013-03-18 NOTE — Assessment & Plan Note (Signed)
A: in chronic A fib. Rate controlled and anticoagulated. Tolerating rate control and anticoagulant well. P: Continue current therapy.

## 2013-03-18 NOTE — Assessment & Plan Note (Addendum)
A: closer to goal. Tolerating medications except only taking hydralazine BID. Reviewed BMP, wnl, K+ wnl. Patient would be a good candidate for either adding aldactone to replace hydralazine given lower normal K+ in the past however she reports preference towards not taking a diuretic and she reported some difficulty with transportation, initiating aldactone would require frequent f/u visits to monitor K+ and Cr initially. Another option would be to use clonidine instead of hydralazine, starting at the 0.1 mg BID dose and dose titrating up every 2-3weeks as tolerated. Another option would be to increase hydralazine dosage to TID (half life is up to  8 hrs in some patients) patient reports that she could do this.   P: Increase hydralazine to TID from BID F/u with PCP in 2 weeks for repeat BP check and dose titration up.  Switching to BID clonidine is an option if patient is not able to comply with TID hydralazine.

## 2013-03-19 NOTE — Progress Notes (Signed)
Pt informed. Jennifer Hinton  

## 2013-03-23 ENCOUNTER — Other Ambulatory Visit: Payer: Self-pay | Admitting: Cardiology

## 2013-03-27 ENCOUNTER — Other Ambulatory Visit: Payer: Self-pay | Admitting: Family Medicine

## 2013-04-06 ENCOUNTER — Other Ambulatory Visit: Payer: Self-pay | Admitting: Family Medicine

## 2013-04-07 NOTE — Telephone Encounter (Signed)
LMOVM for pt to call back and schedule appt. Fleeger, Jennifer Hinton

## 2013-04-07 NOTE — Telephone Encounter (Signed)
Patient needs to come in for follow-up of blood pressure. Please advise her of this. Thanks.

## 2013-04-08 ENCOUNTER — Other Ambulatory Visit: Payer: Self-pay | Admitting: Family Medicine

## 2013-04-08 NOTE — Telephone Encounter (Signed)
Please advise patient to follow up for her blood pressure.

## 2013-04-08 NOTE — Telephone Encounter (Signed)
Pt is aware and will call back when she finds out who can bring her for this appt. Jazmin Hartsell,CMA

## 2013-04-08 NOTE — Telephone Encounter (Signed)
Per patients last visit with Dr Adrian Blackwater she was to have a follow-up appointment scheduled for 2 weeks after that visit for follow-up on her blood pressure. I will refill her medications for one month. She should be advised to follow-up in the next 2 weeks for her blood pressure.

## 2013-04-10 NOTE — Telephone Encounter (Signed)
Patient needs follow-up for blood pressure. Please inform her of this. Thanks.

## 2013-04-12 ENCOUNTER — Encounter: Payer: Self-pay | Admitting: *Deleted

## 2013-04-12 NOTE — Telephone Encounter (Signed)
LMOVM and mailed letter to make appt. Fleeger, Salome Spotted

## 2013-04-21 ENCOUNTER — Ambulatory Visit (INDEPENDENT_AMBULATORY_CARE_PROVIDER_SITE_OTHER): Payer: Medicare Other | Admitting: *Deleted

## 2013-04-21 DIAGNOSIS — Z7901 Long term (current) use of anticoagulants: Secondary | ICD-10-CM | POA: Diagnosis not present

## 2013-04-21 DIAGNOSIS — Z5181 Encounter for therapeutic drug level monitoring: Secondary | ICD-10-CM

## 2013-04-21 DIAGNOSIS — I4891 Unspecified atrial fibrillation: Secondary | ICD-10-CM

## 2013-04-21 LAB — POCT INR: INR: 2.6

## 2013-04-27 ENCOUNTER — Ambulatory Visit (INDEPENDENT_AMBULATORY_CARE_PROVIDER_SITE_OTHER): Payer: Medicare Other | Admitting: *Deleted

## 2013-04-27 VITALS — BP 182/90

## 2013-04-27 DIAGNOSIS — Z136 Encounter for screening for cardiovascular disorders: Secondary | ICD-10-CM | POA: Diagnosis not present

## 2013-04-27 DIAGNOSIS — Z0130 Encounter for examination of blood pressure without abnormal findings: Secondary | ICD-10-CM

## 2013-04-27 NOTE — Progress Notes (Signed)
   Pt in clinic for blood pressure check for medication refill on hydralazine 25 mg table three times daily.  Blood pressure 182/90 taken manually, right arm.  Pt stated she did not have a ride to follow up last month for medication adjustment.  Pt stated she is tolerating taking the hydralazine three times daily.  Pt informed blood pressure reading will be forward to PCP for review.  Pt denies any SOB, dizziness, headache, chest pain or n/v. Derl Barrow, RN

## 2013-04-28 ENCOUNTER — Other Ambulatory Visit: Payer: Self-pay | Admitting: Family Medicine

## 2013-04-28 ENCOUNTER — Telehealth: Payer: Self-pay | Admitting: *Deleted

## 2013-04-28 MED ORDER — HYDRALAZINE HCL 25 MG PO TABS: 50.0000 mg | ORAL_TABLET | Freq: Three times a day (TID) | ORAL | Status: DC

## 2013-04-28 NOTE — Telephone Encounter (Signed)
Message copied by Derl Barrow on Wed Apr 28, 2013  1:35 PM ------      Message from: Caryl Bis, ERIC G      Created: Wed Apr 28, 2013  1:23 PM       I got your note on her blood pressure. Please advise the patient to increase her hydralazine dosage to 50 mg three times a day. I will send in a refill for this. Let me know if you have any questions.            Tommi Rumps ------

## 2013-04-28 NOTE — Telephone Encounter (Signed)
Called pt and informed her to increase her hydralazine dosage to 50 mg three times a day per Dr. Caryl Bis order, see not below.  Pt stated understanding.  Derl Barrow, RN

## 2013-04-30 DIAGNOSIS — I10 Essential (primary) hypertension: Secondary | ICD-10-CM | POA: Diagnosis not present

## 2013-04-30 DIAGNOSIS — H04129 Dry eye syndrome of unspecified lacrimal gland: Secondary | ICD-10-CM | POA: Diagnosis not present

## 2013-04-30 DIAGNOSIS — H01009 Unspecified blepharitis unspecified eye, unspecified eyelid: Secondary | ICD-10-CM | POA: Diagnosis not present

## 2013-04-30 DIAGNOSIS — H40009 Preglaucoma, unspecified, unspecified eye: Secondary | ICD-10-CM | POA: Diagnosis not present

## 2013-05-07 ENCOUNTER — Other Ambulatory Visit: Payer: Self-pay | Admitting: Family Medicine

## 2013-05-07 NOTE — Telephone Encounter (Signed)
Refills sent in. Please inform the patient. Thanks.

## 2013-05-27 ENCOUNTER — Other Ambulatory Visit: Payer: Self-pay | Admitting: Family Medicine

## 2013-05-27 NOTE — Telephone Encounter (Signed)
I will give refill for one month. Please advise the patient that she should come in for a f/u appointment in the next month for her blood pressure as she has not been seen since January for this issue. Thanks.

## 2013-05-27 NOTE — Telephone Encounter (Signed)
Pt is aware and would like to call back and schedule this appt later.Jae Skeet,CMA

## 2013-06-02 ENCOUNTER — Ambulatory Visit (INDEPENDENT_AMBULATORY_CARE_PROVIDER_SITE_OTHER): Payer: Medicare Other | Admitting: *Deleted

## 2013-06-02 DIAGNOSIS — I4891 Unspecified atrial fibrillation: Secondary | ICD-10-CM | POA: Diagnosis not present

## 2013-06-02 DIAGNOSIS — Z7901 Long term (current) use of anticoagulants: Secondary | ICD-10-CM | POA: Diagnosis not present

## 2013-06-02 DIAGNOSIS — Z5181 Encounter for therapeutic drug level monitoring: Secondary | ICD-10-CM

## 2013-06-02 LAB — POCT INR: INR: 3.1

## 2013-06-29 ENCOUNTER — Ambulatory Visit (INDEPENDENT_AMBULATORY_CARE_PROVIDER_SITE_OTHER): Payer: Medicare Other | Admitting: *Deleted

## 2013-06-29 DIAGNOSIS — Z7901 Long term (current) use of anticoagulants: Secondary | ICD-10-CM

## 2013-06-29 DIAGNOSIS — Z5181 Encounter for therapeutic drug level monitoring: Secondary | ICD-10-CM | POA: Diagnosis not present

## 2013-06-29 DIAGNOSIS — I4891 Unspecified atrial fibrillation: Secondary | ICD-10-CM

## 2013-06-29 LAB — POCT INR: INR: 3.1

## 2013-07-04 ENCOUNTER — Other Ambulatory Visit: Payer: Self-pay | Admitting: Family Medicine

## 2013-07-05 NOTE — Telephone Encounter (Signed)
Patient should be advised to come in for a follow-up appointment some time in the next month. Refills given.

## 2013-07-06 NOTE — Telephone Encounter (Signed)
LMOVM for pt to return call.  Pt NEEDS to make appt.  This has been requested several times from MD/CMA and also had letter mailed. Jennifer Hinton [name]

## 2013-07-08 ENCOUNTER — Other Ambulatory Visit: Payer: Self-pay | Admitting: Family Medicine

## 2013-07-08 NOTE — Telephone Encounter (Signed)
Pt is aware of medication refills.  She has an appt scheduled for 08-02-13 with pcp. Jazmin Hartsell,CMA

## 2013-07-08 NOTE — Telephone Encounter (Signed)
Refills given. The patient absolutely needs to come in for a follow-up soon given how high her blood pressures have been on recent checks. Please inform her of this. Thanks.

## 2013-07-12 ENCOUNTER — Other Ambulatory Visit: Payer: Self-pay | Admitting: Family Medicine

## 2013-07-13 NOTE — Telephone Encounter (Signed)
Dr. Caryl Bis,  Have attempted several times for several months to contact pt, with no luck. Laban Emperor Fleeger

## 2013-07-13 NOTE — Telephone Encounter (Signed)
Patient needs to come in for a follow-up appointment. Please inform her of this. Refill given for one month.

## 2013-07-14 ENCOUNTER — Ambulatory Visit (INDEPENDENT_AMBULATORY_CARE_PROVIDER_SITE_OTHER): Payer: Medicare Other | Admitting: *Deleted

## 2013-07-14 DIAGNOSIS — I4891 Unspecified atrial fibrillation: Secondary | ICD-10-CM

## 2013-07-14 DIAGNOSIS — Z7901 Long term (current) use of anticoagulants: Secondary | ICD-10-CM | POA: Diagnosis not present

## 2013-07-14 DIAGNOSIS — Z5181 Encounter for therapeutic drug level monitoring: Secondary | ICD-10-CM

## 2013-07-14 LAB — POCT INR: INR: 2

## 2013-08-02 ENCOUNTER — Ambulatory Visit: Payer: Medicare Other | Admitting: Family Medicine

## 2013-08-04 ENCOUNTER — Ambulatory Visit (INDEPENDENT_AMBULATORY_CARE_PROVIDER_SITE_OTHER): Payer: Medicare Other | Admitting: *Deleted

## 2013-08-04 DIAGNOSIS — Z5181 Encounter for therapeutic drug level monitoring: Secondary | ICD-10-CM | POA: Diagnosis not present

## 2013-08-04 DIAGNOSIS — Z7901 Long term (current) use of anticoagulants: Secondary | ICD-10-CM | POA: Diagnosis not present

## 2013-08-04 DIAGNOSIS — I4891 Unspecified atrial fibrillation: Secondary | ICD-10-CM | POA: Diagnosis not present

## 2013-08-04 LAB — POCT INR: INR: 2.1

## 2013-08-05 ENCOUNTER — Other Ambulatory Visit: Payer: Self-pay | Admitting: Family Medicine

## 2013-08-06 ENCOUNTER — Other Ambulatory Visit: Payer: Self-pay | Admitting: Family Medicine

## 2013-08-09 NOTE — Telephone Encounter (Signed)
Patient should be advised to come in for an appointment as she has not been seen since January and her blood pressure was not at goal at that time. Refill given.

## 2013-08-09 NOTE — Telephone Encounter (Signed)
Has appt 08/17/13. Fleeger, Salome Spotted

## 2013-08-11 ENCOUNTER — Other Ambulatory Visit: Payer: Self-pay | Admitting: Family Medicine

## 2013-08-11 NOTE — Telephone Encounter (Signed)
Patient should be advised again that she needs to return for a follow-up appointment. I have given her a refill.

## 2013-08-11 NOTE — Telephone Encounter (Signed)
LM for patient to call back and schedule an appt with PCP. Cheron Coryell,CMA

## 2013-08-17 ENCOUNTER — Ambulatory Visit: Payer: Medicare Other | Admitting: Family Medicine

## 2013-09-01 ENCOUNTER — Ambulatory Visit (INDEPENDENT_AMBULATORY_CARE_PROVIDER_SITE_OTHER): Payer: Medicare Other | Admitting: *Deleted

## 2013-09-01 DIAGNOSIS — I4891 Unspecified atrial fibrillation: Secondary | ICD-10-CM

## 2013-09-01 DIAGNOSIS — Z7901 Long term (current) use of anticoagulants: Secondary | ICD-10-CM

## 2013-09-01 DIAGNOSIS — Z5181 Encounter for therapeutic drug level monitoring: Secondary | ICD-10-CM | POA: Diagnosis not present

## 2013-09-01 LAB — POCT INR: INR: 2.2

## 2013-09-04 ENCOUNTER — Other Ambulatory Visit: Payer: Self-pay | Admitting: Family Medicine

## 2013-09-05 ENCOUNTER — Other Ambulatory Visit: Payer: Self-pay | Admitting: Family Medicine

## 2013-09-06 NOTE — Telephone Encounter (Signed)
No answer and no machine. Fleeger, Salome Spotted

## 2013-09-06 NOTE — Telephone Encounter (Signed)
Please call patient and advise her that she needs to come in for a follow-up appointment. I sent a one month refill in for her. Thanks.

## 2013-09-12 ENCOUNTER — Other Ambulatory Visit: Payer: Self-pay | Admitting: Family Medicine

## 2013-09-13 NOTE — Telephone Encounter (Signed)
Please inform the patient that she need to come in for an appointment for future refills. We have requested several times over the past several months that she come in for follow-up due to her blood pressure being well above goal the last 2 times she has come in to the office. She has cancelled several appointments over this period of time after having refills sent to the pharmacy. She will need to come in to the office to get her refills at this time. Thanks.

## 2013-09-23 ENCOUNTER — Encounter: Payer: Self-pay | Admitting: Family Medicine

## 2013-09-23 ENCOUNTER — Ambulatory Visit (INDEPENDENT_AMBULATORY_CARE_PROVIDER_SITE_OTHER): Payer: Medicare Other | Admitting: Family Medicine

## 2013-09-23 VITALS — BP 150/74 | HR 57 | Temp 97.7°F | Wt 163.0 lb

## 2013-09-23 DIAGNOSIS — M109 Gout, unspecified: Secondary | ICD-10-CM

## 2013-09-23 DIAGNOSIS — I10 Essential (primary) hypertension: Secondary | ICD-10-CM | POA: Diagnosis not present

## 2013-09-23 DIAGNOSIS — I482 Chronic atrial fibrillation, unspecified: Secondary | ICD-10-CM

## 2013-09-23 DIAGNOSIS — I4891 Unspecified atrial fibrillation: Secondary | ICD-10-CM | POA: Diagnosis not present

## 2013-09-23 MED ORDER — METOPROLOL TARTRATE 100 MG PO TABS: ORAL_TABLET | ORAL | Status: DC

## 2013-09-23 NOTE — Assessment & Plan Note (Signed)
In chronic afib. Is rate controlled and anticoagulated. Is tolerating medications. Will continue current therapy.

## 2013-09-23 NOTE — Patient Instructions (Signed)
Nice to meet you. Please take the hydralazine three times a day.  I would like you to come back for a nursing visit in one month for a blood pressure check.

## 2013-09-23 NOTE — Progress Notes (Signed)
   Patient ID: Jennifer Hinton, female   DOB: 06-12-1935, 78 y.o.   MRN: [id number]  HPI Review of Systems   Physical Exam     Tommi Rumps, MD Phone: [phone]  Jennifer Hinton is a 78 y.o. female who presents today for f/u.  HYPERTENSION Disease Monitoring Home BP Monitoring not checking Chest pain- no    Dyspnea- no Medications Compliance-  Taking medications, though notes is only taking hydralazine BID.  Lightheadedness-  no  Edema- no  Afib: notes no issues with this. Does not experience palpitations. She is compliant with coumadin and INR checks. She has her INR checked every 4-6 weeks at the [facility]madin clinic. She reports she has not seen cardiology in a while. She denies CP, SOB, palpitations, dizziness, or syncope. She denies abnormal bleeding or bruising. She denies any falls in the past 12 months.   Gout: last gout attack was one year ago. She does not currently take any medications for this. Has never taken medications in the past. The location of her attacks is in her 1st MTP joint when she does have them.  Patient is a nonsmoker.   ROS: Per HPI   Physical Exam Filed Vitals:   09/23/13 1514  BP: 150/74  Pulse: 57  Temp: 97.7 F (36.5 C)    Gen: Well NAD HEENT: PERRL,  MMM Lungs: CTABL Nl WOB Heart: irr irr no MRG Exts: Non edematous BL  LE, warm and well perfused.   Assessment/Plan: Please see individual problem list.  # Healthcare maintenance: needs flu vaccine when becomes available

## 2013-09-23 NOTE — Assessment & Plan Note (Addendum)
Is stable at this time. No flares in the past year. Will continue to monitor.

## 2013-09-23 NOTE — Assessment & Plan Note (Signed)
On repeat check she is at goal, though this has not been well controlled in the past. I discussed starting to take her hydralazine 3 times per day. She agreed to this. She is to continue her current regimen otherwise. She is to f/u in one month for a nursing visit for blood pressure check. F/u with me in 3 months.

## 2013-09-29 ENCOUNTER — Ambulatory Visit (INDEPENDENT_AMBULATORY_CARE_PROVIDER_SITE_OTHER): Payer: Medicare Other | Admitting: *Deleted

## 2013-09-29 DIAGNOSIS — Z7901 Long term (current) use of anticoagulants: Secondary | ICD-10-CM

## 2013-09-29 DIAGNOSIS — I4891 Unspecified atrial fibrillation: Secondary | ICD-10-CM | POA: Diagnosis not present

## 2013-09-29 DIAGNOSIS — Z5181 Encounter for therapeutic drug level monitoring: Secondary | ICD-10-CM | POA: Diagnosis not present

## 2013-09-29 LAB — POCT INR: INR: 2.4

## 2013-10-06 ENCOUNTER — Ambulatory Visit (INDEPENDENT_AMBULATORY_CARE_PROVIDER_SITE_OTHER): Payer: Medicare Other | Admitting: *Deleted

## 2013-10-06 VITALS — BP 182/96 | HR 66

## 2013-10-06 DIAGNOSIS — Z136 Encounter for screening for cardiovascular disorders: Secondary | ICD-10-CM

## 2013-10-06 DIAGNOSIS — I10 Essential (primary) hypertension: Secondary | ICD-10-CM

## 2013-10-06 DIAGNOSIS — Z0130 Encounter for examination of blood pressure without abnormal findings: Secondary | ICD-10-CM

## 2013-10-06 NOTE — Progress Notes (Signed)
Patient here today for BP check.  BP--182/96 & P--66.  Patient states she has lost 67 lbs and continues to have high BP.  Denies any headache or blurred vision.  Will route note to Dr. Caryl Bis for advice and call patient back.  Burna Forts, BSN, RN-BC

## 2013-10-13 ENCOUNTER — Telehealth: Payer: Self-pay | Admitting: *Deleted

## 2013-10-13 NOTE — Telephone Encounter (Signed)
Unable to reach patient via phone despite multiple attempts to discuss elevated BP (Dr. Caryl Bis adding hydralazine).  Will mail letter to patient to contact our office.  Burna Forts, BSN, RN-BC

## 2013-10-23 ENCOUNTER — Other Ambulatory Visit: Payer: Self-pay | Admitting: Family Medicine

## 2013-10-26 NOTE — Telephone Encounter (Signed)
Refill given on metoprolol. Please inform the patient she needs to come in for a follow-up office visit. Thanks.

## 2013-10-27 NOTE — Telephone Encounter (Signed)
Pt was mailed letter on 10/13/13 to contact office about BP. Fleeger, Salome Spotted

## 2013-10-28 ENCOUNTER — Other Ambulatory Visit: Payer: Self-pay | Admitting: Family Medicine

## 2013-10-29 NOTE — Telephone Encounter (Signed)
Refill sent in for patient. Please inform her that she needs to come in for follow-up in the next month as we discussed at her last visit. Thanks.

## 2013-11-01 ENCOUNTER — Encounter: Payer: Self-pay | Admitting: *Deleted

## 2013-11-01 ENCOUNTER — Other Ambulatory Visit: Payer: Self-pay | Admitting: Family Medicine

## 2013-11-01 NOTE — Telephone Encounter (Signed)
Mailed letter. Fleeger, Salome Spotted

## 2013-11-05 ENCOUNTER — Other Ambulatory Visit: Payer: Self-pay | Admitting: Family Medicine

## 2013-11-08 ENCOUNTER — Other Ambulatory Visit: Payer: Self-pay | Admitting: Family Medicine

## 2013-11-09 ENCOUNTER — Other Ambulatory Visit: Payer: Self-pay | Admitting: *Deleted

## 2013-11-09 MED ORDER — WARFARIN SODIUM 5 MG PO TABS: 5.0000 mg | ORAL_TABLET | ORAL | Status: DC

## 2013-11-10 NOTE — Telephone Encounter (Signed)
Refill given. Please inform patient that she needs to come in for follow-up appointment in the next month. Thanks.

## 2013-11-10 NOTE — Telephone Encounter (Signed)
Mailed letter on 11/01/2013. Fleeger, Salome Spotted

## 2013-11-17 ENCOUNTER — Ambulatory Visit (INDEPENDENT_AMBULATORY_CARE_PROVIDER_SITE_OTHER): Payer: Medicare Other | Admitting: *Deleted

## 2013-11-17 DIAGNOSIS — Z5181 Encounter for therapeutic drug level monitoring: Secondary | ICD-10-CM | POA: Diagnosis not present

## 2013-11-17 DIAGNOSIS — Z7901 Long term (current) use of anticoagulants: Secondary | ICD-10-CM | POA: Diagnosis not present

## 2013-11-17 DIAGNOSIS — I4891 Unspecified atrial fibrillation: Secondary | ICD-10-CM

## 2013-11-17 LAB — POCT INR: INR: 2.8

## 2013-11-18 ENCOUNTER — Other Ambulatory Visit: Payer: Self-pay | Admitting: Family Medicine

## 2013-11-30 ENCOUNTER — Telehealth: Payer: Self-pay | Admitting: Family Medicine

## 2013-11-30 MED ORDER — DILTIAZEM HCL ER COATED BEADS 240 MG PO CP24: ORAL_CAPSULE | ORAL | Status: DC

## 2013-11-30 MED ORDER — ENALAPRIL MALEATE 20 MG PO TABS
ORAL_TABLET | ORAL | Status: DC
Start: 2013-11-30 — End: 2013-12-13

## 2013-11-30 MED ORDER — HYDRALAZINE HCL 25 MG PO TABS: ORAL_TABLET | ORAL | Status: DC

## 2013-11-30 MED ORDER — METOPROLOL TARTRATE 100 MG PO TABS: ORAL_TABLET | ORAL | Status: DC

## 2013-11-30 NOTE — Telephone Encounter (Signed)
Refills sent in for one month.

## 2013-11-30 NOTE — Telephone Encounter (Signed)
Pt called and made an appointment for 11/2 to get med refills on her BP medication. The problem is that she is out of her BP medication and would like enough medication to get her through until her appointment 11/2. Jennifer Hinton

## 2013-12-12 ENCOUNTER — Other Ambulatory Visit: Payer: Self-pay | Admitting: Family Medicine

## 2013-12-13 NOTE — Telephone Encounter (Signed)
Has appt 12/20/13. Jennifer Hinton, Salome Spotted

## 2013-12-13 NOTE — Telephone Encounter (Signed)
Please advise patient she needs to come in for an office visit. Thanks.

## 2013-12-20 ENCOUNTER — Encounter: Payer: Self-pay | Admitting: Family Medicine

## 2013-12-20 ENCOUNTER — Ambulatory Visit (INDEPENDENT_AMBULATORY_CARE_PROVIDER_SITE_OTHER): Payer: Medicare Other | Admitting: Family Medicine

## 2013-12-20 VITALS — BP 190/90 | HR 120 | Temp 97.5°F | Ht 66.0 in | Wt 156.0 lb

## 2013-12-20 DIAGNOSIS — I10 Essential (primary) hypertension: Secondary | ICD-10-CM

## 2013-12-20 DIAGNOSIS — I4891 Unspecified atrial fibrillation: Secondary | ICD-10-CM | POA: Diagnosis not present

## 2013-12-20 LAB — BASIC METABOLIC PANEL
BUN: 24 mg/dL — ABNORMAL HIGH (ref 6–23)
CALCIUM: 9.4 mg/dL (ref 8.4–10.5)
CHLORIDE: 106 meq/L (ref 96–112)
CO2: 22 mEq/L (ref 19–32)
Creat: 1.03 mg/dL (ref 0.50–1.10)
Glucose, Bld: 90 mg/dL (ref 70–99)
Potassium: 3.9 mEq/L (ref 3.5–5.3)
Sodium: 139 mEq/L (ref 135–145)

## 2013-12-20 MED ORDER — METOPROLOL TARTRATE 100 MG PO TABS: ORAL_TABLET | ORAL | Status: DC

## 2013-12-20 MED ORDER — ENALAPRIL MALEATE 20 MG PO TABS: ORAL_TABLET | ORAL | Status: DC

## 2013-12-20 NOTE — Patient Instructions (Addendum)
Nice to see you. You need to take your blood pressure medications.  We are going to start you back on enalapril and metoprolol. We will get some blood work and call with the results.  You need to bring all your medications with you to your next visit.

## 2013-12-21 NOTE — Progress Notes (Signed)
Patient ID: Jennifer Hinton, female   DOB: [birth year], 78 y.o.   MRN: [id number]  Jennifer Rumps, MD Phone: [phone]  Jennifer Hinton is a 78 y.o. female who presents today for f/u.  HYPERTENSION Disease Monitoring Home BP Monitoring not checking Chest pain- no    Dyspnea- no Medications Compliance-  Has not taken medication in the past week. States she has been out of her medication and could not get her medicine from the pharmacy when she requested this. Nursing called and spoke to the pharmacist and per the nurse the patient has not asked for specific refills so they have not refilled her medications as she does not specify which medications need to be refilled.         Lightheadedness-  no  Edema- no Denies abdominal pain, urinary issues, and vision changes Denies palpitations. States she keeps track of her medications in a pill box.  Patient is a nonsmoker.   ROS: Per HPI   Physical Exam Filed Vitals:   12/20/13 0937  BP: 190/90  Pulse: 120  Temp: 97.5 F (36.4 C)    Gen: Well NAD HEENT: PERRL,  MMM Lungs: CTABL Nl WOB Heart: RRR no MRG Exts: Non edematous BL  LE, warm and well perfused.    Assessment/Plan: Please see individual problem list.  # Healthcare maintenance: needs flu shot at next visit  Jennifer Rumps, MD [place] PGY-3

## 2013-12-21 NOTE — Assessment & Plan Note (Addendum)
Not at goal today. She has been out of her medications for the past week and has had questionable compliance in the past. Given this we will restart her on her metoprolol and enalapril. I highlighted these medications on her AVS and outlined these in her instructions. Refills of these were sent to the pharmacy. Given the issues with medication compliance we have scheduled the patient in pharmacy clinic for a med rec and HTN visit. She will follow-up with 2-4 weeks after this pharmacy clinic visit.   Precepted with Dr Erin Hearing

## 2013-12-21 NOTE — Assessment & Plan Note (Signed)
HR 120 on manual exam today. Likely related to patient not taking her metoprolol in the past week. Refills sent to pharmacy for metoprolol. Advised to start taking this.

## 2013-12-23 ENCOUNTER — Ambulatory Visit: Payer: Medicare Other | Admitting: Pharmacist

## 2013-12-29 ENCOUNTER — Ambulatory Visit (INDEPENDENT_AMBULATORY_CARE_PROVIDER_SITE_OTHER): Payer: Medicare Other | Admitting: *Deleted

## 2013-12-29 DIAGNOSIS — Z5181 Encounter for therapeutic drug level monitoring: Secondary | ICD-10-CM | POA: Diagnosis not present

## 2013-12-29 DIAGNOSIS — Z7901 Long term (current) use of anticoagulants: Secondary | ICD-10-CM

## 2013-12-29 DIAGNOSIS — I4891 Unspecified atrial fibrillation: Secondary | ICD-10-CM

## 2013-12-29 LAB — POCT INR: INR: 2

## 2014-03-08 ENCOUNTER — Encounter: Payer: Self-pay | Admitting: Family Medicine

## 2014-03-08 ENCOUNTER — Ambulatory Visit (INDEPENDENT_AMBULATORY_CARE_PROVIDER_SITE_OTHER): Payer: Medicare Other | Admitting: Family Medicine

## 2014-03-08 VITALS — BP 188/110 | HR 78 | Temp 97.5°F | Ht 66.0 in | Wt 165.5 lb

## 2014-03-08 DIAGNOSIS — I10 Essential (primary) hypertension: Secondary | ICD-10-CM

## 2014-03-08 DIAGNOSIS — R0600 Dyspnea, unspecified: Secondary | ICD-10-CM | POA: Diagnosis not present

## 2014-03-08 DIAGNOSIS — M25552 Pain in left hip: Secondary | ICD-10-CM | POA: Diagnosis not present

## 2014-03-08 DIAGNOSIS — I4891 Unspecified atrial fibrillation: Secondary | ICD-10-CM

## 2014-03-08 DIAGNOSIS — W19XXXA Unspecified fall, initial encounter: Secondary | ICD-10-CM

## 2014-03-08 LAB — COMPREHENSIVE METABOLIC PANEL
ALBUMIN: 2.9 g/dL — AB (ref 3.5–5.2)
ALT: 15 U/L (ref 0–35)
AST: 32 U/L (ref 0–37)
Alkaline Phosphatase: 41 U/L (ref 39–117)
BUN: 37 mg/dL — ABNORMAL HIGH (ref 6–23)
CHLORIDE: 107 meq/L (ref 96–112)
CO2: 24 mEq/L (ref 19–32)
Calcium: 8.7 mg/dL (ref 8.4–10.5)
Creat: 1.6 mg/dL — ABNORMAL HIGH (ref 0.50–1.10)
GLUCOSE: 99 mg/dL (ref 70–99)
Potassium: 3.6 mEq/L (ref 3.5–5.3)
Sodium: 139 mEq/L (ref 135–145)
TOTAL PROTEIN: 6.3 g/dL (ref 6.0–8.3)
Total Bilirubin: 1.2 mg/dL (ref 0.2–1.2)

## 2014-03-08 MED ORDER — DILTIAZEM HCL ER COATED BEADS 180 MG PO CP24: ORAL_CAPSULE | ORAL | Status: DC

## 2014-03-08 NOTE — Patient Instructions (Addendum)
Nice to see you. Please continue taking your metoprolol and enalapril as prescribed. We are going to restart you on diltiazem at 180 mg daily. I sent this to the pharmacy for you.  We will have you return on Thursday for a visit with the pharmacist and then return next Monday for a visit with me for your blood pressure.  We have referred you for home health to evaluate your home and for physical therapy to come see you at home.  Please go get the X-ray of your left hip.  If you develop chest pain, shortness of breath, palpitations, weakness, numbness, change in vision, or change in speech please seek medical attention.

## 2014-03-09 ENCOUNTER — Telehealth: Payer: Self-pay | Admitting: Family Medicine

## 2014-03-09 DIAGNOSIS — R0600 Dyspnea, unspecified: Secondary | ICD-10-CM | POA: Insufficient documentation

## 2014-03-09 DIAGNOSIS — W19XXXA Unspecified fall, initial encounter: Secondary | ICD-10-CM | POA: Insufficient documentation

## 2014-03-09 DIAGNOSIS — R296 Repeated falls: Secondary | ICD-10-CM | POA: Insufficient documentation

## 2014-03-09 LAB — PROTIME-INR
INR: 4.08 — ABNORMAL HIGH (ref <1.50)
Prothrombin Time: 39.6 seconds — ABNORMAL HIGH (ref 11.6–15.2)

## 2014-03-09 MED ORDER — AMLODIPINE BESYLATE 5 MG PO TABS: 5.0000 mg | ORAL_TABLET | Freq: Every day | ORAL | Status: DC

## 2014-03-09 NOTE — Progress Notes (Signed)
Patient ID: Jennifer Hinton, female   DOB: 07/05/1935, 79 y.o.   MRN: [id number]  Jennifer Rumps, MD Phone: [phone]  Burnell Hartl is a 79 y.o. female who presents today for f/u.  HYPERTENSION Disease Monitoring Home BP Monitoring not checking Chest pain- no    Dyspnea- yes, see below Medications Compliance-  Difficult to tell what she is taking, at first she states she is taking all the medicines on her lis which include enalapril, metoprolol, diltiazem, hydralazine, and cardura. Then she states that she is only taking metoprolol and enalapril as directed at her last visit.  Edema- no Denies headache, blurry vision, chest pain, dyspnea, weakness, and numbness.   Dyspnea: notes she has been getting short of breath with activities such as walking short distances. No shortness of breath while sitting down. No orthopnea or PND. No chest pain. Has previously seen a cardiologist, though not in some time. Last echo 2009 with EF 55-60%, no LVH, no mention of diastolic dysfunction. No active dyspnea or chest pain.   Falls: patient and granddaughter report 3 falls in the past 2 weeks. Each time she was walking and states she tripped over something small on the ground. She denies syncope and light headedness with this. She denies chest pain, palpitations, and head injury. She notes she fell on her left hip with one of these falls and now has some pain with walking. Each time she fell she had difficulty getting off the floor with out help.    Patient is a nonsmoker.    ROS: Per HPI   Physical Exam Filed Vitals:   03/08/14 1630  BP: 188/110  Pulse:   Temp:     Gen: Well NAD HEENT: PERRL,  MMM Lungs: CTABL Nl WOB Heart: irr irr, rate controlled  Neuro: CN 2-12 intact, 5/5 strength in bilateral biceps, triceps, grip, quads, hamstrings, plantar and dorsiflexion, sensation to light touch intact in bilateral UE and LE, mild limp on gait with slow walking and little elevation of feet off the  ground with walking MSK: no decrease in ROM of bilateral hips, there is mild tenderness of the left superior lateral hip, no bruising noted in this area Exts: Non edematous BL  LE, warm and well perfused.    Assessment/Plan: Please see individual problem list.  Jennifer Rumps, MD [place] PGY-3

## 2014-03-09 NOTE — Assessment & Plan Note (Signed)
Rate controlled. INR was checked and is 4.08. Advised on coumadin regimen, please see phone note from 03/09/14 for details. She needs to see cardiology for this in follow-up. Will need recheck of INR next week.

## 2014-03-09 NOTE — Assessment & Plan Note (Addendum)
Remains not at goal. Patient did not follow-up as directed at last visit. It appears that she has been taking metoprolol and enalapril per the directions at her last visit. Cr elevated to 1.6 on check today. Will d/c enalapril and place on amlodipine 5 mg daily. Initially was to restart diltiazem, though this was discontinued given potential for effecting HR in combination with metoprolol. She will continue metoprolol. She has no neurological deficits on exam. She really needs to keep good follow-up and I discussed this with her and her granddaughter. She needs to take her medications as directed. She will currently be taking metoprolol and amlodipine. She was scheduled in pharmacy clinic for 1/21 to have a med rec and HTN evaluation. She was advised to bring all of her medications to this appointment. Needs BMET on 03/10/14. Future order placed. I discussed that if she were to develop chest pain, dyspnea, numbness, weakness, blurry vision, speech change, or headache she needs to go to the ED. She voiced understanding.   Precepted with Dr Ree Kida

## 2014-03-09 NOTE — Assessment & Plan Note (Signed)
Likely multifactorial in nature with poor gait, vision, and poor strength contributing. No syncope, focal weakness, or palpitations to indicate a cardiac or neurological cause. Will have [facility] and nursing come to her house for home evaluation and for medication teaching. Will get XR left hip given fall and pain, though suspect soft tissue injury given exam. Will follow-up with me next week.

## 2014-03-09 NOTE — Telephone Encounter (Addendum)
Called patient to discuss lab results. Her phone number in our system is incorrect. I called her emergency contact number to get the correct phone number for the patient which is [phone]. I advised her of her elevated INR to 4.08. I discussed a regimen with Horris Latino and the decision was made to have the patient hold her coumadin today and restart tomorrow, Thursday, with with the following regimen: Sunday 5 mg Monday 5 mg Tuesday 2.5 mg Wednesday 5 mg Thurdsday 5 mg Friday 2.5 mg and Saturday 5 mg. I advised the patient that her Cr was elevated to 1.6 and this was up from her previous values. Advised that this could be related to her blood pressure and her enalapril. Advised that she needs to stop taking the enalapril and that she will need to start on amlodipine which I will send in to the pharmacy for her. She will need to come in to see the pharmacist tomorrow as scheduled.   She additionally states she woke up not feeling well this morning. States she ate too much last night and has some nausea. She has no vomiting or diarrhea. Denies headache, weakness, numbness, and blurry vision. I advised her that she should be seen today and offered to make an appointment for her while I was on the phone with her. She declined an appointment today. I advised a second time that she should be seen today if not feeling well and she declined again. I reminded her of her appointment tomorrow with Dr Valentina Lucks. I advised her that she should seek medical attention if she developed headache, blurry vision, numbness, weakness, vomiting, diarrhea, speech changes, or continued to not feel well. She voiced understanding.

## 2014-03-09 NOTE — Assessment & Plan Note (Signed)
Patient with intermittent dyspnea on exertion. Has not had echo or seen a cardiologist in some time. Suspect some level of this is related to her persistently elevated BP, though need to evaluate further. No pulmonary abnormalities on exam. Will get echo. Refer to cardiology. Given return precautions.

## 2014-03-10 ENCOUNTER — Telehealth: Payer: Self-pay | Admitting: *Deleted

## 2014-03-10 ENCOUNTER — Ambulatory Visit: Payer: Medicare Other | Admitting: Pharmacist

## 2014-03-10 ENCOUNTER — Other Ambulatory Visit (HOSPITAL_COMMUNITY): Payer: Self-pay | Admitting: Family Medicine

## 2014-03-10 DIAGNOSIS — R0600 Dyspnea, unspecified: Secondary | ICD-10-CM

## 2014-03-10 NOTE — Telephone Encounter (Signed)
-----   Message from Leone Haven, MD sent at 03/10/2014 11:33 AM EST ----- Please call the patient and advise her that she will need to have her INR checked early next week, either at her coumadin clinic with [facility] or in our clinic. I have already discussed these results with her, though did not tell her to return for an INR check. Thanks.

## 2014-03-10 NOTE — Telephone Encounter (Signed)
Pt is aware of her need for an appt.  She would like to see [facility] [place] coumadin clinic.  Sylvanus Telford,CMA

## 2014-03-11 ENCOUNTER — Other Ambulatory Visit (HOSPITAL_COMMUNITY): Payer: Medicare Other

## 2014-03-14 DIAGNOSIS — I4891 Unspecified atrial fibrillation: Secondary | ICD-10-CM | POA: Diagnosis not present

## 2014-03-14 DIAGNOSIS — I10 Essential (primary) hypertension: Secondary | ICD-10-CM | POA: Diagnosis not present

## 2014-03-14 DIAGNOSIS — R0600 Dyspnea, unspecified: Secondary | ICD-10-CM | POA: Diagnosis not present

## 2014-03-14 DIAGNOSIS — R2689 Other abnormalities of gait and mobility: Secondary | ICD-10-CM | POA: Diagnosis not present

## 2014-03-15 ENCOUNTER — Other Ambulatory Visit: Payer: Self-pay | Admitting: Family Medicine

## 2014-03-15 ENCOUNTER — Ambulatory Visit (HOSPITAL_COMMUNITY)
Admission: RE | Admit: 2014-03-15 | Discharge: 2014-03-15 | Disposition: A | Discharge status: Home / Self Care | Payer: Medicare Other | Source: Ambulatory Visit | Attending: Family Medicine | Admitting: Family Medicine

## 2014-03-15 ENCOUNTER — Other Ambulatory Visit (HOSPITAL_COMMUNITY): Payer: Medicare Other

## 2014-03-15 DIAGNOSIS — M160 Bilateral primary osteoarthritis of hip: Secondary | ICD-10-CM | POA: Diagnosis not present

## 2014-03-15 DIAGNOSIS — M25552 Pain in left hip: Secondary | ICD-10-CM | POA: Insufficient documentation

## 2014-03-16 ENCOUNTER — Encounter (HOSPITAL_COMMUNITY): Payer: Self-pay | Admitting: Family Medicine

## 2014-03-16 ENCOUNTER — Encounter: Payer: Self-pay | Admitting: Family Medicine

## 2014-03-17 ENCOUNTER — Ambulatory Visit (INDEPENDENT_AMBULATORY_CARE_PROVIDER_SITE_OTHER): Payer: Medicare Other | Admitting: Pharmacist

## 2014-03-17 ENCOUNTER — Ambulatory Visit (HOSPITAL_COMMUNITY): Payer: Medicare Other | Attending: Cardiology | Admitting: Radiology

## 2014-03-17 ENCOUNTER — Encounter: Payer: Self-pay | Admitting: Pharmacist

## 2014-03-17 DIAGNOSIS — I4891 Unspecified atrial fibrillation: Secondary | ICD-10-CM

## 2014-03-17 DIAGNOSIS — I10 Essential (primary) hypertension: Secondary | ICD-10-CM

## 2014-03-17 DIAGNOSIS — E785 Hyperlipidemia, unspecified: Secondary | ICD-10-CM | POA: Diagnosis not present

## 2014-03-17 DIAGNOSIS — Z5181 Encounter for therapeutic drug level monitoring: Secondary | ICD-10-CM

## 2014-03-17 DIAGNOSIS — E7800 Pure hypercholesterolemia, unspecified: Secondary | ICD-10-CM

## 2014-03-17 DIAGNOSIS — R0600 Dyspnea, unspecified: Secondary | ICD-10-CM

## 2014-03-17 LAB — LIPID PANEL
Cholesterol: 100 mg/dL (ref 0–200)
HDL: 36 mg/dL — ABNORMAL LOW (ref >39)
LDL Cholesterol: 52 mg/dL (ref 0–99)
Total CHOL/HDL Ratio: 2.8 Ratio
Triglycerides: 60 mg/dL (ref <150)
VLDL: 12 mg/dL (ref 0–40)

## 2014-03-17 LAB — BASIC METABOLIC PANEL
BUN: 21 mg/dL (ref 6–23)
CO2: 25 mEq/L (ref 19–32)
CREATININE: 1.16 mg/dL — AB (ref 0.50–1.10)
Calcium: 8.8 mg/dL (ref 8.4–10.5)
Chloride: 103 mEq/L (ref 96–112)
GLUCOSE: 95 mg/dL (ref 70–99)
Potassium: 3.2 mEq/L — ABNORMAL LOW (ref 3.5–5.3)
Sodium: 139 mEq/L (ref 135–145)

## 2014-03-17 LAB — POCT INR: INR: 2.2

## 2014-03-17 NOTE — Assessment & Plan Note (Signed)
History of hypertension since age 79 who is found to have elevated blood pressure > 211/125 upon arrival.   After taking metoprolol and amlodipine in office found to have slightly improved reading.  She was willing to continue with amlodipine 10mg  daily AND restart doxazosin 2mg  (supply available already in her possession) tonight.   Reviewed dosing of taking 1/2 pill each night and reviewed possibility of dizziness or light-headedness.   Patient aware of side effect and verbalize need for caution with standing especially at night.  Reevaluate blood pressure in two weeks.  F/U Clinic Visit with Dr. Caryl Bis OR with me in 2 weeks.   Consider amlodipine 10mg  (single pill) AND possibly the use of carvedilol in the future to avoid both metoprolol and doxazosin.   Additionally, she may require additional therapy.  Consider use of clonidine patch to assist with adherence in this patient with variable adherence.

## 2014-03-17 NOTE — Patient Instructions (Signed)
Bring all of your medication to your visits.   Next visit in 2 weeks with Dr. Caryl Bis  Or with with Pharmacist if unable to get 2 week visit with Dr. Caryl Bis.   Continue taking Metoproplol 2 pills in the morning and 2 pills in the evening.   Increase the amlodipine to 2 pills daily in the morning.   START taking doxazosin 1/2 pill each evening starting tonight.   THANKS for your patience today.

## 2014-03-17 NOTE — Progress Notes (Signed)
Echocardiogram performed.  

## 2014-03-17 NOTE — Progress Notes (Signed)
Patient ID: Jennifer Hinton, female   DOB: [birth year], 79 y.o.   MRN: [id number] Reviewed: Agree with Dr. Graylin Shiver documentation and management.

## 2014-03-17 NOTE — Progress Notes (Signed)
S:    Patient arrives accompanied by her granddaughter.   She walks slowly and cautiously without assistance.  She presents to the clinic for blood pressure evaluation and medication review.   Diagnosed with Hypertension at age 79.  Did NOT take medication for many years.  Reports medication compliance is reported to be excellent however she arrives this AM without taking any of her morning blood pressure medications.  She denies any significant symptoms today.   Current BP Medications include:  Amlodipine 5mg  daily AND metoprolol 200mg  BID.   Antihypertensives tried in the past include: doxazosin 4mg  (has in possession but NOT taking) and recently failed trial of enalapril due to elevated Scr.   SHE denies taking or possessing hydralazine, or diltiazem.   Her enalapril supply was removed from her possession and sent for destruction with her permission to avoid reinitiation.     O:  Initial in office reading with machine today was 211/125 with automated device.   Followed by in office reading of 198/110 with manual reading of left arm 30 minutes AFTER taking amlodipine 10mg  AND Metoprolol 200mg  in OFFICE. Last 3 Office BP readings: BP Readings from Last 3 Encounters:  03/17/14 198/118  03/08/14 188/110  12/20/13 190/90    BMET    Component Value Date/Time   NA 139 03/08/2014 1659   K 3.6 03/08/2014 1659   CL 107 03/08/2014 1659   CO2 24 03/08/2014 1659   GLUCOSE 99 03/08/2014 1659   BUN 37* 03/08/2014 1659   CREATININE 1.60* 03/08/2014 1659   CREATININE 0.98 05/31/2009 1813   CALCIUM 8.7 03/08/2014 1659    A/P: History of hypertension since age 40 who is found to have elevated blood pressure > 211/125 upon arrival.   After taking metoprolol and amlodipine in office found to have slightly improved reading.  She was willing to continue with amlodipine 10mg  daily AND restart doxazosin 2mg  (supply available already in her possession) tonight.   Reviewed dosing of taking 1/2 pill  each night and reviewed possibility of dizziness or light-headedness.   Patient aware of side effect and verbalize need for caution with standing especially at night.  Reevaluate blood pressure in two weeks.  F/U Clinic Visit with Dr. Caryl Bis OR with me in 2 weeks.   Consider amlodipine 10mg  (single pill) AND possibly the use of carvedilol in the future to avoid both metoprolol and doxazosin.   Additionally, she may require additional therapy.  Consider use of clonidine patch to assist with adherence in this patient with variable adherence.  Total time in face-to-face counseling 75 minutes.  Patient seen with Randell Patient, PharmD Candidate, and Milus Glazier,  PharmD Resident.

## 2014-03-18 ENCOUNTER — Telehealth: Payer: Self-pay | Admitting: Family Medicine

## 2014-03-18 DIAGNOSIS — I10 Essential (primary) hypertension: Secondary | ICD-10-CM | POA: Diagnosis not present

## 2014-03-18 DIAGNOSIS — R2689 Other abnormalities of gait and mobility: Secondary | ICD-10-CM | POA: Diagnosis not present

## 2014-03-18 DIAGNOSIS — E876 Hypokalemia: Secondary | ICD-10-CM

## 2014-03-18 DIAGNOSIS — I4891 Unspecified atrial fibrillation: Secondary | ICD-10-CM | POA: Diagnosis not present

## 2014-03-18 DIAGNOSIS — R0600 Dyspnea, unspecified: Secondary | ICD-10-CM | POA: Diagnosis not present

## 2014-03-18 MED ORDER — POTASSIUM CHLORIDE CRYS ER 20 MEQ PO TBCR: 20.0000 meq | EXTENDED_RELEASE_TABLET | Freq: Every day | ORAL | Status: DC

## 2014-03-18 NOTE — Telephone Encounter (Signed)
Spoke with patient regarding hypokalemia. Advised that I would send in potassium supplement to take for the next 2 days. She will come in for lab appointment and BMET on Monday. This appointment was made by myself.

## 2014-03-18 NOTE — Telephone Encounter (Deleted)
-----   Message from Zigmund Gottron, MD sent at 03/18/2014  8:41 AM EST -----   ----- Message -----    From: Lab in [facility] Interface    Sent: 03/17/2014   8:47 PM      To: Zigmund Gottron, MD

## 2014-03-21 ENCOUNTER — Other Ambulatory Visit: Payer: Medicare Other

## 2014-03-21 DIAGNOSIS — R2689 Other abnormalities of gait and mobility: Secondary | ICD-10-CM | POA: Diagnosis not present

## 2014-03-21 DIAGNOSIS — I10 Essential (primary) hypertension: Secondary | ICD-10-CM | POA: Diagnosis not present

## 2014-03-21 DIAGNOSIS — R0600 Dyspnea, unspecified: Secondary | ICD-10-CM | POA: Diagnosis not present

## 2014-03-21 DIAGNOSIS — I4891 Unspecified atrial fibrillation: Secondary | ICD-10-CM | POA: Diagnosis not present

## 2014-03-23 DIAGNOSIS — R0600 Dyspnea, unspecified: Secondary | ICD-10-CM | POA: Diagnosis not present

## 2014-03-23 DIAGNOSIS — I10 Essential (primary) hypertension: Secondary | ICD-10-CM | POA: Diagnosis not present

## 2014-03-23 DIAGNOSIS — R2689 Other abnormalities of gait and mobility: Secondary | ICD-10-CM | POA: Diagnosis not present

## 2014-03-23 DIAGNOSIS — I4891 Unspecified atrial fibrillation: Secondary | ICD-10-CM | POA: Diagnosis not present

## 2014-03-25 DIAGNOSIS — I10 Essential (primary) hypertension: Secondary | ICD-10-CM | POA: Diagnosis not present

## 2014-03-25 DIAGNOSIS — R0600 Dyspnea, unspecified: Secondary | ICD-10-CM | POA: Diagnosis not present

## 2014-03-25 DIAGNOSIS — I4891 Unspecified atrial fibrillation: Secondary | ICD-10-CM | POA: Diagnosis not present

## 2014-03-25 DIAGNOSIS — R2689 Other abnormalities of gait and mobility: Secondary | ICD-10-CM | POA: Diagnosis not present

## 2014-03-28 DIAGNOSIS — R0600 Dyspnea, unspecified: Secondary | ICD-10-CM | POA: Diagnosis not present

## 2014-03-28 DIAGNOSIS — R2689 Other abnormalities of gait and mobility: Secondary | ICD-10-CM | POA: Diagnosis not present

## 2014-03-28 DIAGNOSIS — I10 Essential (primary) hypertension: Secondary | ICD-10-CM | POA: Diagnosis not present

## 2014-03-28 DIAGNOSIS — I4891 Unspecified atrial fibrillation: Secondary | ICD-10-CM | POA: Diagnosis not present

## 2014-03-30 ENCOUNTER — Ambulatory Visit: Payer: Medicare Other | Admitting: Cardiology

## 2014-03-30 ENCOUNTER — Ambulatory Visit: Payer: Medicare Other | Admitting: Family Medicine

## 2014-03-30 DIAGNOSIS — I10 Essential (primary) hypertension: Secondary | ICD-10-CM | POA: Diagnosis not present

## 2014-03-30 DIAGNOSIS — R0600 Dyspnea, unspecified: Secondary | ICD-10-CM | POA: Diagnosis not present

## 2014-03-30 DIAGNOSIS — R2689 Other abnormalities of gait and mobility: Secondary | ICD-10-CM | POA: Diagnosis not present

## 2014-03-30 DIAGNOSIS — I4891 Unspecified atrial fibrillation: Secondary | ICD-10-CM | POA: Diagnosis not present

## 2014-04-01 ENCOUNTER — Ambulatory Visit: Payer: Medicare Other | Admitting: Cardiology

## 2014-04-06 DIAGNOSIS — I4891 Unspecified atrial fibrillation: Secondary | ICD-10-CM | POA: Diagnosis not present

## 2014-04-06 DIAGNOSIS — I10 Essential (primary) hypertension: Secondary | ICD-10-CM | POA: Diagnosis not present

## 2014-04-06 DIAGNOSIS — R2689 Other abnormalities of gait and mobility: Secondary | ICD-10-CM | POA: Diagnosis not present

## 2014-04-06 DIAGNOSIS — R0600 Dyspnea, unspecified: Secondary | ICD-10-CM | POA: Diagnosis not present

## 2014-04-08 DIAGNOSIS — I10 Essential (primary) hypertension: Secondary | ICD-10-CM | POA: Diagnosis not present

## 2014-04-08 DIAGNOSIS — R0600 Dyspnea, unspecified: Secondary | ICD-10-CM | POA: Diagnosis not present

## 2014-04-08 DIAGNOSIS — I4891 Unspecified atrial fibrillation: Secondary | ICD-10-CM | POA: Diagnosis not present

## 2014-04-08 DIAGNOSIS — R2689 Other abnormalities of gait and mobility: Secondary | ICD-10-CM | POA: Diagnosis not present

## 2014-04-13 DIAGNOSIS — R0600 Dyspnea, unspecified: Secondary | ICD-10-CM | POA: Diagnosis not present

## 2014-04-13 DIAGNOSIS — R2689 Other abnormalities of gait and mobility: Secondary | ICD-10-CM | POA: Diagnosis not present

## 2014-04-13 DIAGNOSIS — I10 Essential (primary) hypertension: Secondary | ICD-10-CM | POA: Diagnosis not present

## 2014-04-13 DIAGNOSIS — I4891 Unspecified atrial fibrillation: Secondary | ICD-10-CM | POA: Diagnosis not present

## 2014-04-14 DIAGNOSIS — R0600 Dyspnea, unspecified: Secondary | ICD-10-CM | POA: Diagnosis not present

## 2014-04-14 DIAGNOSIS — I10 Essential (primary) hypertension: Secondary | ICD-10-CM | POA: Diagnosis not present

## 2014-04-14 DIAGNOSIS — I4891 Unspecified atrial fibrillation: Secondary | ICD-10-CM | POA: Diagnosis not present

## 2014-04-14 DIAGNOSIS — R2689 Other abnormalities of gait and mobility: Secondary | ICD-10-CM | POA: Diagnosis not present

## 2014-04-15 DIAGNOSIS — I4891 Unspecified atrial fibrillation: Secondary | ICD-10-CM | POA: Diagnosis not present

## 2014-04-15 DIAGNOSIS — R2689 Other abnormalities of gait and mobility: Secondary | ICD-10-CM | POA: Diagnosis not present

## 2014-04-15 DIAGNOSIS — R0600 Dyspnea, unspecified: Secondary | ICD-10-CM | POA: Diagnosis not present

## 2014-04-15 DIAGNOSIS — I10 Essential (primary) hypertension: Secondary | ICD-10-CM | POA: Diagnosis not present

## 2014-04-20 DIAGNOSIS — R0600 Dyspnea, unspecified: Secondary | ICD-10-CM | POA: Diagnosis not present

## 2014-04-20 DIAGNOSIS — I4891 Unspecified atrial fibrillation: Secondary | ICD-10-CM | POA: Diagnosis not present

## 2014-04-20 DIAGNOSIS — R2689 Other abnormalities of gait and mobility: Secondary | ICD-10-CM | POA: Diagnosis not present

## 2014-04-20 DIAGNOSIS — I10 Essential (primary) hypertension: Secondary | ICD-10-CM | POA: Diagnosis not present

## 2014-04-25 ENCOUNTER — Other Ambulatory Visit: Payer: Self-pay | Admitting: Family Medicine

## 2014-04-25 NOTE — Telephone Encounter (Signed)
Refill sent in. Please inform the patient she needs to follow-up for her blood pressure in the next 1-2 weeks. Thanks.

## 2014-04-26 DIAGNOSIS — I10 Essential (primary) hypertension: Secondary | ICD-10-CM | POA: Diagnosis not present

## 2014-04-26 DIAGNOSIS — I4891 Unspecified atrial fibrillation: Secondary | ICD-10-CM | POA: Diagnosis not present

## 2014-04-26 DIAGNOSIS — R0600 Dyspnea, unspecified: Secondary | ICD-10-CM | POA: Diagnosis not present

## 2014-04-26 DIAGNOSIS — R2689 Other abnormalities of gait and mobility: Secondary | ICD-10-CM | POA: Diagnosis not present

## 2014-04-26 NOTE — Telephone Encounter (Signed)
Letter mailed. Tashaya Ancrum,CMA  

## 2014-04-28 DIAGNOSIS — I10 Essential (primary) hypertension: Secondary | ICD-10-CM | POA: Diagnosis not present

## 2014-04-28 DIAGNOSIS — R0600 Dyspnea, unspecified: Secondary | ICD-10-CM | POA: Diagnosis not present

## 2014-04-28 DIAGNOSIS — R2689 Other abnormalities of gait and mobility: Secondary | ICD-10-CM | POA: Diagnosis not present

## 2014-04-28 DIAGNOSIS — I4891 Unspecified atrial fibrillation: Secondary | ICD-10-CM | POA: Diagnosis not present

## 2014-05-03 DIAGNOSIS — R2689 Other abnormalities of gait and mobility: Secondary | ICD-10-CM | POA: Diagnosis not present

## 2014-05-03 DIAGNOSIS — I10 Essential (primary) hypertension: Secondary | ICD-10-CM | POA: Diagnosis not present

## 2014-05-03 DIAGNOSIS — R0600 Dyspnea, unspecified: Secondary | ICD-10-CM | POA: Diagnosis not present

## 2014-05-03 DIAGNOSIS — I4891 Unspecified atrial fibrillation: Secondary | ICD-10-CM | POA: Diagnosis not present

## 2014-05-04 ENCOUNTER — Ambulatory Visit (INDEPENDENT_AMBULATORY_CARE_PROVIDER_SITE_OTHER): Payer: Medicare Other | Admitting: *Deleted

## 2014-05-04 DIAGNOSIS — Z5181 Encounter for therapeutic drug level monitoring: Secondary | ICD-10-CM

## 2014-05-04 DIAGNOSIS — I4891 Unspecified atrial fibrillation: Secondary | ICD-10-CM

## 2014-05-04 LAB — POCT INR: INR: 2.8

## 2014-05-05 DIAGNOSIS — R0600 Dyspnea, unspecified: Secondary | ICD-10-CM | POA: Diagnosis not present

## 2014-05-05 DIAGNOSIS — R2689 Other abnormalities of gait and mobility: Secondary | ICD-10-CM | POA: Diagnosis not present

## 2014-05-05 DIAGNOSIS — I10 Essential (primary) hypertension: Secondary | ICD-10-CM | POA: Diagnosis not present

## 2014-05-05 DIAGNOSIS — I4891 Unspecified atrial fibrillation: Secondary | ICD-10-CM | POA: Diagnosis not present

## 2014-05-06 ENCOUNTER — Ambulatory Visit: Payer: Medicare Other | Admitting: Cardiology

## 2014-05-10 DIAGNOSIS — I4891 Unspecified atrial fibrillation: Secondary | ICD-10-CM | POA: Diagnosis not present

## 2014-05-10 DIAGNOSIS — R2689 Other abnormalities of gait and mobility: Secondary | ICD-10-CM | POA: Diagnosis not present

## 2014-05-10 DIAGNOSIS — R0600 Dyspnea, unspecified: Secondary | ICD-10-CM | POA: Diagnosis not present

## 2014-05-10 DIAGNOSIS — I10 Essential (primary) hypertension: Secondary | ICD-10-CM | POA: Diagnosis not present

## 2014-05-13 ENCOUNTER — Other Ambulatory Visit: Payer: Self-pay | Admitting: Family Medicine

## 2014-05-13 DIAGNOSIS — R0600 Dyspnea, unspecified: Secondary | ICD-10-CM | POA: Diagnosis not present

## 2014-05-13 DIAGNOSIS — I4891 Unspecified atrial fibrillation: Secondary | ICD-10-CM | POA: Diagnosis not present

## 2014-05-13 DIAGNOSIS — I10 Essential (primary) hypertension: Secondary | ICD-10-CM | POA: Diagnosis not present

## 2014-05-13 DIAGNOSIS — M6281 Muscle weakness (generalized): Secondary | ICD-10-CM | POA: Diagnosis not present

## 2014-05-13 DIAGNOSIS — R2689 Other abnormalities of gait and mobility: Secondary | ICD-10-CM | POA: Diagnosis not present

## 2014-05-17 ENCOUNTER — Encounter: Payer: Self-pay | Admitting: Cardiology

## 2014-05-18 DIAGNOSIS — M6281 Muscle weakness (generalized): Secondary | ICD-10-CM | POA: Diagnosis not present

## 2014-05-18 DIAGNOSIS — I4891 Unspecified atrial fibrillation: Secondary | ICD-10-CM | POA: Diagnosis not present

## 2014-05-18 DIAGNOSIS — R2689 Other abnormalities of gait and mobility: Secondary | ICD-10-CM | POA: Diagnosis not present

## 2014-05-18 DIAGNOSIS — R0600 Dyspnea, unspecified: Secondary | ICD-10-CM | POA: Diagnosis not present

## 2014-05-18 DIAGNOSIS — I10 Essential (primary) hypertension: Secondary | ICD-10-CM | POA: Diagnosis not present

## 2014-05-20 DIAGNOSIS — R2689 Other abnormalities of gait and mobility: Secondary | ICD-10-CM | POA: Diagnosis not present

## 2014-05-20 DIAGNOSIS — M6281 Muscle weakness (generalized): Secondary | ICD-10-CM | POA: Diagnosis not present

## 2014-05-20 DIAGNOSIS — R0600 Dyspnea, unspecified: Secondary | ICD-10-CM | POA: Diagnosis not present

## 2014-05-20 DIAGNOSIS — I10 Essential (primary) hypertension: Secondary | ICD-10-CM | POA: Diagnosis not present

## 2014-05-20 DIAGNOSIS — I4891 Unspecified atrial fibrillation: Secondary | ICD-10-CM | POA: Diagnosis not present

## 2014-05-23 DIAGNOSIS — M6281 Muscle weakness (generalized): Secondary | ICD-10-CM | POA: Diagnosis not present

## 2014-05-23 DIAGNOSIS — R2689 Other abnormalities of gait and mobility: Secondary | ICD-10-CM | POA: Diagnosis not present

## 2014-05-23 DIAGNOSIS — R0600 Dyspnea, unspecified: Secondary | ICD-10-CM | POA: Diagnosis not present

## 2014-05-23 DIAGNOSIS — I10 Essential (primary) hypertension: Secondary | ICD-10-CM | POA: Diagnosis not present

## 2014-05-23 DIAGNOSIS — I4891 Unspecified atrial fibrillation: Secondary | ICD-10-CM | POA: Diagnosis not present

## 2014-05-24 ENCOUNTER — Other Ambulatory Visit: Payer: Self-pay | Admitting: Cardiology

## 2014-05-27 DIAGNOSIS — M6281 Muscle weakness (generalized): Secondary | ICD-10-CM | POA: Diagnosis not present

## 2014-05-27 DIAGNOSIS — R2689 Other abnormalities of gait and mobility: Secondary | ICD-10-CM | POA: Diagnosis not present

## 2014-05-27 DIAGNOSIS — R0600 Dyspnea, unspecified: Secondary | ICD-10-CM | POA: Diagnosis not present

## 2014-05-27 DIAGNOSIS — I10 Essential (primary) hypertension: Secondary | ICD-10-CM | POA: Diagnosis not present

## 2014-05-27 DIAGNOSIS — I4891 Unspecified atrial fibrillation: Secondary | ICD-10-CM | POA: Diagnosis not present

## 2014-05-30 DIAGNOSIS — M6281 Muscle weakness (generalized): Secondary | ICD-10-CM | POA: Diagnosis not present

## 2014-05-30 DIAGNOSIS — R0600 Dyspnea, unspecified: Secondary | ICD-10-CM | POA: Diagnosis not present

## 2014-05-30 DIAGNOSIS — R2689 Other abnormalities of gait and mobility: Secondary | ICD-10-CM | POA: Diagnosis not present

## 2014-05-30 DIAGNOSIS — I4891 Unspecified atrial fibrillation: Secondary | ICD-10-CM | POA: Diagnosis not present

## 2014-05-30 DIAGNOSIS — I10 Essential (primary) hypertension: Secondary | ICD-10-CM | POA: Diagnosis not present

## 2014-06-01 DIAGNOSIS — R0600 Dyspnea, unspecified: Secondary | ICD-10-CM | POA: Diagnosis not present

## 2014-06-01 DIAGNOSIS — I10 Essential (primary) hypertension: Secondary | ICD-10-CM | POA: Diagnosis not present

## 2014-06-01 DIAGNOSIS — I4891 Unspecified atrial fibrillation: Secondary | ICD-10-CM | POA: Diagnosis not present

## 2014-06-01 DIAGNOSIS — M6281 Muscle weakness (generalized): Secondary | ICD-10-CM | POA: Diagnosis not present

## 2014-06-01 DIAGNOSIS — R2689 Other abnormalities of gait and mobility: Secondary | ICD-10-CM | POA: Diagnosis not present

## 2014-06-04 ENCOUNTER — Encounter (HOSPITAL_COMMUNITY): Payer: Self-pay

## 2014-06-04 ENCOUNTER — Inpatient Hospital Stay (HOSPITAL_COMMUNITY)
Admission: EM | Admit: 2014-06-04 | Discharge: 2014-06-08 | DRG: 291 | Disposition: A | Discharge status: Home Health | Payer: Medicare Other | Attending: Family Medicine | Admitting: Family Medicine

## 2014-06-04 ENCOUNTER — Emergency Department (HOSPITAL_COMMUNITY): Payer: Medicare Other

## 2014-06-04 DIAGNOSIS — I119 Hypertensive heart disease without heart failure: Secondary | ICD-10-CM | POA: Diagnosis present

## 2014-06-04 DIAGNOSIS — I10 Essential (primary) hypertension: Secondary | ICD-10-CM | POA: Diagnosis not present

## 2014-06-04 DIAGNOSIS — N179 Acute kidney failure, unspecified: Secondary | ICD-10-CM | POA: Diagnosis present

## 2014-06-04 DIAGNOSIS — I5041 Acute combined systolic (congestive) and diastolic (congestive) heart failure: Secondary | ICD-10-CM | POA: Diagnosis not present

## 2014-06-04 DIAGNOSIS — Z9119 Patient's noncompliance with other medical treatment and regimen: Secondary | ICD-10-CM | POA: Diagnosis present

## 2014-06-04 DIAGNOSIS — W19XXXA Unspecified fall, initial encounter: Secondary | ICD-10-CM | POA: Diagnosis not present

## 2014-06-04 DIAGNOSIS — R0600 Dyspnea, unspecified: Secondary | ICD-10-CM | POA: Diagnosis not present

## 2014-06-04 DIAGNOSIS — N183 Chronic kidney disease, stage 3 unspecified: Secondary | ICD-10-CM | POA: Diagnosis present

## 2014-06-04 DIAGNOSIS — Z79899 Other long term (current) drug therapy: Secondary | ICD-10-CM | POA: Diagnosis not present

## 2014-06-04 DIAGNOSIS — D649 Anemia, unspecified: Secondary | ICD-10-CM | POA: Diagnosis present

## 2014-06-04 DIAGNOSIS — Z6825 Body mass index (BMI) 25.0-25.9, adult: Secondary | ICD-10-CM

## 2014-06-04 DIAGNOSIS — E785 Hyperlipidemia, unspecified: Secondary | ICD-10-CM | POA: Diagnosis present

## 2014-06-04 DIAGNOSIS — I5031 Acute diastolic (congestive) heart failure: Secondary | ICD-10-CM | POA: Diagnosis not present

## 2014-06-04 DIAGNOSIS — R0602 Shortness of breath: Secondary | ICD-10-CM | POA: Diagnosis not present

## 2014-06-04 DIAGNOSIS — Z9111 Patient's noncompliance with dietary regimen: Secondary | ICD-10-CM | POA: Diagnosis present

## 2014-06-04 DIAGNOSIS — I110 Hypertensive heart disease with heart failure: Secondary | ICD-10-CM | POA: Diagnosis not present

## 2014-06-04 DIAGNOSIS — I071 Rheumatic tricuspid insufficiency: Secondary | ICD-10-CM | POA: Diagnosis present

## 2014-06-04 DIAGNOSIS — E876 Hypokalemia: Secondary | ICD-10-CM | POA: Diagnosis present

## 2014-06-04 DIAGNOSIS — E440 Moderate protein-calorie malnutrition: Secondary | ICD-10-CM | POA: Diagnosis present

## 2014-06-04 DIAGNOSIS — I270 Primary pulmonary hypertension: Secondary | ICD-10-CM | POA: Diagnosis not present

## 2014-06-04 DIAGNOSIS — M25569 Pain in unspecified knee: Secondary | ICD-10-CM | POA: Diagnosis not present

## 2014-06-04 DIAGNOSIS — I5033 Acute on chronic diastolic (congestive) heart failure: Secondary | ICD-10-CM | POA: Diagnosis not present

## 2014-06-04 DIAGNOSIS — I4821 Permanent atrial fibrillation: Secondary | ICD-10-CM | POA: Diagnosis present

## 2014-06-04 DIAGNOSIS — R600 Localized edema: Secondary | ICD-10-CM | POA: Diagnosis not present

## 2014-06-04 DIAGNOSIS — Z7982 Long term (current) use of aspirin: Secondary | ICD-10-CM | POA: Diagnosis not present

## 2014-06-04 DIAGNOSIS — I130 Hypertensive heart and chronic kidney disease with heart failure and stage 1 through stage 4 chronic kidney disease, or unspecified chronic kidney disease: Secondary | ICD-10-CM | POA: Diagnosis not present

## 2014-06-04 DIAGNOSIS — R531 Weakness: Secondary | ICD-10-CM | POA: Diagnosis not present

## 2014-06-04 DIAGNOSIS — Z7901 Long term (current) use of anticoagulants: Secondary | ICD-10-CM | POA: Diagnosis not present

## 2014-06-04 DIAGNOSIS — Z8249 Family history of ischemic heart disease and other diseases of the circulatory system: Secondary | ICD-10-CM

## 2014-06-04 DIAGNOSIS — I482 Chronic atrial fibrillation: Secondary | ICD-10-CM | POA: Diagnosis present

## 2014-06-04 DIAGNOSIS — I509 Heart failure, unspecified: Secondary | ICD-10-CM

## 2014-06-04 DIAGNOSIS — R6889 Other general symptoms and signs: Secondary | ICD-10-CM | POA: Diagnosis not present

## 2014-06-04 DIAGNOSIS — I272 Other secondary pulmonary hypertension: Secondary | ICD-10-CM | POA: Diagnosis present

## 2014-06-04 DIAGNOSIS — R296 Repeated falls: Secondary | ICD-10-CM | POA: Diagnosis present

## 2014-06-04 DIAGNOSIS — I160 Hypertensive urgency: Secondary | ICD-10-CM | POA: Diagnosis present

## 2014-06-04 HISTORY — DX: Rheumatoid arthritis, unspecified: M06.9

## 2014-06-04 LAB — COMPREHENSIVE METABOLIC PANEL
ALT: 13 U/L (ref 0–35)
AST: 27 U/L (ref 0–37)
Albumin: 3 g/dL — ABNORMAL LOW (ref 3.5–5.2)
Alkaline Phosphatase: 47 U/L (ref 39–117)
Anion gap: 12 (ref 5–15)
BUN: 24 mg/dL — AB (ref 6–23)
CO2: 18 mmol/L — AB (ref 19–32)
CREATININE: 1.37 mg/dL — AB (ref 0.50–1.10)
Calcium: 8.8 mg/dL (ref 8.4–10.5)
Chloride: 106 mmol/L (ref 96–112)
GFR calc Af Amer: 42 mL/min — ABNORMAL LOW (ref >90)
GFR calc non Af Amer: 36 mL/min — ABNORMAL LOW (ref >90)
Glucose, Bld: 127 mg/dL — ABNORMAL HIGH (ref 70–99)
POTASSIUM: 3.1 mmol/L — AB (ref 3.5–5.1)
Sodium: 136 mmol/L (ref 135–145)
Total Bilirubin: 1.7 mg/dL — ABNORMAL HIGH (ref 0.3–1.2)
Total Protein: 6.9 g/dL (ref 6.0–8.3)

## 2014-06-04 LAB — CBC
HEMATOCRIT: 32.8 % — AB (ref 36.0–46.0)
Hemoglobin: 10.7 g/dL — ABNORMAL LOW (ref 12.0–15.0)
MCH: 30.8 pg (ref 26.0–34.0)
MCHC: 32.6 g/dL (ref 30.0–36.0)
MCV: 94.5 fL (ref 78.0–100.0)
Platelets: 121 10*3/uL — ABNORMAL LOW (ref 150–400)
RBC: 3.47 MIL/uL — AB (ref 3.87–5.11)
RDW: 16.7 % — ABNORMAL HIGH (ref 11.5–15.5)
WBC: 7.4 10*3/uL (ref 4.0–10.5)

## 2014-06-04 LAB — PROTIME-INR
INR: 3.5 — ABNORMAL HIGH (ref 0.00–1.49)
Prothrombin Time: 35.4 seconds — ABNORMAL HIGH (ref 11.6–15.2)

## 2014-06-04 LAB — TROPONIN I: Troponin I: 0.06 ng/mL — ABNORMAL HIGH (ref <0.031)

## 2014-06-04 LAB — I-STAT TROPONIN, ED: TROPONIN I, POC: 0.05 ng/mL (ref 0.00–0.08)

## 2014-06-04 LAB — BRAIN NATRIURETIC PEPTIDE: B Natriuretic Peptide: 4042 pg/mL — ABNORMAL HIGH (ref 0.0–100.0)

## 2014-06-04 MED ORDER — SODIUM CHLORIDE 0.9 % IJ SOLN
3.0000 mL | Freq: Two times a day (BID) | INTRAMUSCULAR | Status: DC
  Administered 2014-06-04 – 2014-06-08 (×7): 3 mL via INTRAVENOUS

## 2014-06-04 MED ORDER — METOPROLOL TARTRATE 25 MG PO TABS
100.0000 mg | ORAL_TABLET | Freq: Once | ORAL | Status: AC
  Administered 2014-06-04: 100 mg via ORAL
  Filled 2014-06-04: qty 4

## 2014-06-04 MED ORDER — ASPIRIN 81 MG PO TABS: 81.0000 mg | ORAL_TABLET | Freq: Every day | ORAL | Status: DC

## 2014-06-04 MED ORDER — METOPROLOL TARTRATE 100 MG PO TABS: 200.0000 mg | ORAL_TABLET | Freq: Two times a day (BID) | ORAL | Status: DC

## 2014-06-04 MED ORDER — FUROSEMIDE 10 MG/ML IJ SOLN
40.0000 mg | Freq: Two times a day (BID) | INTRAMUSCULAR | Status: DC
  Administered 2014-06-04 – 2014-06-06 (×4): 40 mg via INTRAVENOUS
  Filled 2014-06-04 (×6): qty 4

## 2014-06-04 MED ORDER — METOPROLOL TARTRATE 100 MG PO TABS
200.0000 mg | ORAL_TABLET | Freq: Two times a day (BID) | ORAL | Status: DC
Start: 2014-06-05 — End: 2014-06-05
  Administered 2014-06-05: 200 mg via ORAL
  Filled 2014-06-04 (×2): qty 2

## 2014-06-04 MED ORDER — POTASSIUM CHLORIDE CRYS ER 20 MEQ PO TBCR
40.0000 meq | EXTENDED_RELEASE_TABLET | Freq: Once | ORAL | Status: AC
  Administered 2014-06-04: 40 meq via ORAL
  Filled 2014-06-04: qty 2

## 2014-06-04 MED ORDER — SODIUM CHLORIDE 0.9 % IJ SOLN: 3.0000 mL | INTRAMUSCULAR | Status: DC | PRN

## 2014-06-04 MED ORDER — FUROSEMIDE 10 MG/ML IJ SOLN
40.0000 mg | Freq: Once | INTRAMUSCULAR | Status: AC
  Administered 2014-06-04: 40 mg via INTRAVENOUS
  Filled 2014-06-04: qty 4

## 2014-06-04 MED ORDER — NITROGLYCERIN 2 % TD OINT
1.0000 [in_us] | TOPICAL_OINTMENT | Freq: Once | TRANSDERMAL | Status: AC
  Administered 2014-06-04: 1 [in_us] via TOPICAL
  Filled 2014-06-04: qty 1

## 2014-06-04 MED ORDER — ACETAMINOPHEN 325 MG PO TABS
650.0000 mg | ORAL_TABLET | Freq: Four times a day (QID) | ORAL | Status: DC | PRN
  Administered 2014-06-04 – 2014-06-05 (×2): 650 mg via ORAL
  Filled 2014-06-04 (×4): qty 2

## 2014-06-04 MED ORDER — SODIUM CHLORIDE 0.9 % IV SOLN: 250.0000 mL | INTRAVENOUS | Status: DC | PRN

## 2014-06-04 MED ORDER — ASPIRIN EC 81 MG PO TBEC
81.0000 mg | DELAYED_RELEASE_TABLET | Freq: Every day | ORAL | Status: DC
  Administered 2014-06-04 – 2014-06-08 (×5): 81 mg via ORAL
  Filled 2014-06-04 (×5): qty 1

## 2014-06-04 MED ORDER — SODIUM CHLORIDE 0.9 % IJ SOLN: 3.0000 mL | Freq: Two times a day (BID) | INTRAMUSCULAR | Status: DC

## 2014-06-04 MED ORDER — HYDRALAZINE HCL 20 MG/ML IJ SOLN
20.0000 mg | INTRAMUSCULAR | Status: DC | PRN
  Administered 2014-06-05 – 2014-06-08 (×2): 20 mg via INTRAVENOUS
  Filled 2014-06-04 (×2): qty 1

## 2014-06-04 NOTE — ED Provider Notes (Signed)
CSN: [id number]     Arrival date & time 06/04/14  1417 History   First MD Initiated Contact with Patient 06/04/14 1428     Chief Complaint  Patient presents with  . Congestive Heart Failure     (Consider location/radiation/quality/duration/timing/severity/associated sxs/prior Treatment) HPI Comments: Patient noncompliant with her meds. Here with daughters who are concerned about CHF. They have noticed increasing SOB over the past few days.  Patient is a 79 y.o. female presenting with CHF. The history is provided by the patient and a relative.  Congestive Heart Failure This is a new problem. The current episode started more than 2 days ago. The problem occurs constantly. The problem has not changed since onset.Associated symptoms include shortness of breath. Pertinent negatives include no chest pain and no abdominal pain. Nothing aggravates the symptoms. Nothing relieves the symptoms.    Past Medical History  Diagnosis Date  . Atrial fibrillation   . Hyperlipidemia   . Hypertension    History reviewed. No pertinent past surgical history. Family History  Problem Relation Age of Onset  . Heart disease Brother   . Hyperlipidemia Brother    History  Substance Use Topics  . Smoking status: Never Smoker   . Smokeless tobacco: Never Used  . Alcohol Use: No   OB History    No data available     Review of Systems  Constitutional: Negative for fever and chills.  Respiratory: Positive for shortness of breath.   Cardiovascular: Negative for chest pain.  Gastrointestinal: Negative for vomiting and abdominal pain.  All other systems reviewed and are negative.     Allergies  Review of patient's allergies indicates no known allergies.  Home Medications   Prior to Admission medications   Medication Sig Start Date End Date Taking? Authorizing Provider  acetaminophen (TYLENOL) 650 MG CR tablet One tab by mouth q4-6h as needed pain in knee     Historical Provider, MD  amLODipine  (NORVASC) 5 MG tablet Take 2 tablets (10 mg total) by mouth daily. 03/17/14   Zenia Resides, MD  aspirin 81 MG tablet Take 81 mg by mouth daily.      Historical Provider, MD  doxazosin (CARDURA) 4 MG tablet TAKE 1 TABLET BY MOUTH AT BEDTIME 05/17/14   Leone Haven, MD  metoprolol (LOPRESSOR) 100 MG tablet TAKE 2 TABLETS TWICE A DAY 12/20/13   Leone Haven, MD  potassium chloride SA (K-DUR,KLOR-CON) 20 MEQ tablet Take 1 tablet (20 mEq total) by mouth daily. 03/18/14   Leone Haven, MD  warfarin (COUMADIN) 5 MG tablet Take as directed by coumadin clinic 05/24/14   Lelon Perla, MD   BP 213/147 mmHg  Pulse 97  Temp(Src) 97.6 F (36.4 C) (Oral)  Resp 22  SpO2 95% Physical Exam  Constitutional: She is oriented to person, place, and time. She appears well-developed and well-nourished. No distress.  HENT:  Head: Normocephalic and atraumatic.  Mouth/Throat: Oropharynx is clear and moist.  Eyes: EOM are normal. Pupils are equal, round, and reactive to light.  Neck: Normal range of motion. Neck supple. JVD present.  Cardiovascular: Normal rate and regular rhythm.  Exam reveals no friction rub.   Murmur heard.  Systolic murmur is present with a grade of 4/6  Pulmonary/Chest: Breath sounds normal. Tachypnea noted. No respiratory distress. She has no wheezes. She has no rhonchi. She has no rales.  Abdominal: Soft. She exhibits no distension. There is no tenderness. There is no rebound.  Musculoskeletal: Normal range  of motion. She exhibits no edema.  Neurological: She is alert and oriented to person, place, and time.  Skin: No rash noted. She is not diaphoretic.  Nursing note and vitals reviewed.   ED Course  Procedures (including critical care time) Labs Review Labs Reviewed  CBC  COMPREHENSIVE METABOLIC PANEL  BRAIN NATRIURETIC PEPTIDE  I-STAT [place], ED    Imaging Review Dg Chest Portable 1 View  06/04/2014   CLINICAL DATA:  Weakness and pain in both legs. History  of congestive heart failure.  EXAM: PORTABLE CHEST - 1 VIEW  COMPARISON:  None.  FINDINGS: There is moderate enlargement of the cardiopericardial silhouette. No mediastinal or hilar masses or convincing adenopathy.  Lungs are mildly hyperexpanded. There is no lung consolidation or edema. No pleural effusion or pneumothorax.  Bony thorax is demineralized but grossly intact.  IMPRESSION: Significant enlargement of the cardiopericardial silhouette. This may all be cardiomegaly. Consider pericardial effusion in the proper clinical setting.  No evidence of pneumonia or pulmonary edema.   Electronically Signed   By: Lajean Manes M.D.   On: 06/04/2014 15:20     EKG Interpretation   Date/Time:  Saturday June 04 2014 14:55:16 EDT Ventricular Rate:  105 PR Interval:    QRS Duration: 98 QT Interval:  398 QTC Calculation: 526 R Axis:   118 Text Interpretation:  Atrial fibrillation Ventricular premature complex  Right axis deviation Borderline repol abnormality, diffuse leads Prolonged  QT interval Mild inferior T wave flattening, new Confirmed by Mingo Amber  MD,  [place] ([id number]) on 06/04/2014 5:13:27 PM       EMERGENCY DEPARTMENT [place] CARDIAC EXAM "Study: Limited Ultrasound of the heart and pericardium"  INDICATIONS:Unstable Vital Signs Multiple views of the heart and pericardium are obtained with a multi-frequency probe.  PERFORMED [id number]  IMAGES ARCHIVED?: Yes  FINDINGS: No pericardial effusion, Decreased contractility, IVC dilated and Tamponade physiology absent  LIMITATIONS:  none  VIEWS USED: Subcostal 4 chamber, Parasternal long axis, Parasternal short axis, Apical 4 chamber  and Inferior Vena Cava  INTERPRETATION: Cardiac activity present, Pericardial effusioin absent and Decreased contractility  COMMENT:  Dilated myocardium  MDM   Final diagnoses:  CHF exacerbation  Dyspnea    65F with hx of HTN, HLD, Afib on coumadin. Patient noncompliant with HTN meds.  Here with JVD,  hypertension. NTG paste given. Labs sent. 4/6 systolic murmur, no rales or rhonchi. Concern for CHF. She is a Clinical biochemist patient. BNP elevated at 4042. Lasix given. [place] at bedside with decreased contractility, no effusion. Admitted to [facility].   Evelina Bucy, MD 06/04/14 [phone]

## 2014-06-04 NOTE — ED Notes (Signed)
Pt. Sent here by daughter for CHF. Notes  pitting edema to bilateral lower extremities and states patient was SOB. Pt. Denies SOB or CP on arrival. HX HTN and patient did not take HTN medications this AM.  Pt. C/o chronic bilateral knee pain due to arthritis and abdominal pain due to eating ice cream. Denies other complaints.

## 2014-06-04 NOTE — Progress Notes (Signed)
ANTICOAGULATION CONSULT NOTE - Initial Consult  Pharmacy Consult for Warfarin  Indication: atrial fibrillation  No Known Allergies  Patient Measurements: Height: 5\' 5"  (165.1 cm) Weight: 162 lb 6.4 oz (73.664 kg) IBW/kg (Calculated) : 57  Labs:  Recent Labs  06/04/14 1505 06/04/14 2154  HGB 10.7*  --   HCT 32.8*  --   PLT 121*  --   LABPROT  --  35.4*  INR  --  3.50*  CREATININE 1.37*  --    Medical History: Past Medical History  Diagnosis Date  . Atrial fibrillation   . Hyperlipidemia   . Hypertension    Assessment: [name] on warfarin for afib, INR is supra-therapeutic at 3.5 on admit, Hgb 10.7, Plts low at 121, noted renal dysfunction, other labs as above.   Goal of Therapy:  INR 2-3 Monitor platelets by anticoagulation protocol: Yes   Plan:  -Hold warfarin for now -Daily PT/INR, re-start warfarin as indicated  -Monitor for bleeding  Narda Bonds 06/04/2014,10:46 PM

## 2014-06-04 NOTE — ED Notes (Signed)
Attempted report 

## 2014-06-04 NOTE — ED Notes (Signed)
Attempted Report 

## 2014-06-04 NOTE — H&P (Signed)
[facility] Admission History and Physical Service Pager: [phone]  Patient name: Jennifer Hinton Medical record number: [id number] Date of birth: [birth year] Age: 79 y.o. Gender: female  Primary Care Provider: Tommi Rumps, MD Consultants: Cardiology Code Status: Full  Chief Complaint: leg swelling  Assessment and Plan: Jennifer Hinton is a 79 y.o. female presenting with stigmata of heart failure. PMH is significant for HTN, HLD, AFib on coumadin, tricuspid insufficiency.   Acute CHF: Pro-BNP elevated to 4042 with significant JVD. Cardiomegaly without significant pulmonary edema on CXR. Previous echo (Jan 2016): Normal EF, LVH, severe bi-atrial enlargement, severe tricuspid regurgitation and pulmonary HTN.  - Cardiology consult due to concern for new HF and possible worsening tricuspid insufficiency.  - Diuresis with lasix 40mg  IV BID - Daily weights, strict I/O - Formal 2D echocardiogram (bedside echo showed decreased contractility and dilated IVC without pericardial effusion or tamponade) - Cycle troponin x2 (mild elevation thought to be due to demand; T wave flattening in III and aVF noted with no prior for comparison) - Repeat ECG in AM  Hypertensive urgency: With several severe range BPs in ED without end organ damage (unless troponin is truly elevated).  - Treated adequately with metoprolol in ED, will give lasix BID and hydralazine prn. Restart lopressor tomorrow.  - Welcome cardiology input  Hypokalemia: Mild, no ECG changes.  - Replete orally and monitor as we give lasix.   Atrial fibrillation: Rate controlled in ED. Concern for incomplete adherence to medications in the past and pt unsure of medications today. Will check PT/INR.  - Coumadin per pharmacy - Continue lopressor 200mg  po BID for rate control.   Mild normocytic anemia:  - Monitor - No colonoscopy found in EMR. Consider outpatient work up   FEN/GI: Heart healthy diet; Saline lock  IV Prophylaxis: Full dose coumadin  Disposition: Admit for new acute CHF.   History of Present Illness: Jennifer Hinton is a 79 y.o. female brought by her daughters for concern for CHF.   Jennifer Hinton's daughters (one of whom she lives with) states that she has had increasing leg swelling for the past several days as well as dyspnea with exertion. She denies significant change in her respiratory status as well as chest pain and palpitations. She further denies orthopnea and PND. She reports she takes all her medicines but is unable to tell me what they are and does not have them with her. Her children are not sure if she takes them every day. She denies HA, vision changes, syncope (but her children report falling), abd pain, nausea, vomiting, diarrhea. No fever or cough.   Review Of Systems: Per HPI  Otherwise 12 point review of systems was performed and was unremarkable.  Patient Active Problem List   Diagnosis Date Noted  . Acute CHF 06/04/2014  . Dyspnea 03/09/2014  . Falls 03/09/2014  . Encounter for therapeutic drug monitoring 04/21/2013  . Tinnitus 10/05/2012  . Encounter for long-term (current) use of anticoagulants 05/23/2010  . LIPOMA 11/30/2008  . PROTEINURIA 11/30/2008  . GOUT, UNSPECIFIED 06/27/2008  . HYPERTENSION, MALIGNANT ESSENTIAL 12/17/2006  . HYPERCHOLESTEROLEMIA 04/17/2006  . OBESITY, NOS 04/17/2006  . Atrial fibrillation 04/17/2006  . OSTEOARTHRITIS, LOWER LEG 04/17/2006   Past Medical History: Past Medical History  Diagnosis Date  . Atrial fibrillation   . Hyperlipidemia   . Hypertension    Past Surgical History: History reviewed. No pertinent past surgical history. Social History: History  Substance Use Topics  . Smoking status: Never Smoker   .  Smokeless tobacco: Never Used  . Alcohol Use: No    Please also refer to relevant sections of EMR.  Family History: Family History  Problem Relation Age of Onset  . Heart disease Brother   . Hyperlipidemia  Brother    Allergies and Medications: No Known Allergies No current facility-administered medications on file prior to encounter.   Current Outpatient Prescriptions on File Prior to Encounter  Medication Sig Dispense Refill  . acetaminophen (TYLENOL) 650 MG CR tablet One tab by mouth q4-6h as needed pain in knee     . amLODipine (NORVASC) 5 MG tablet Take 2 tablets (10 mg total) by mouth daily.    Marland Kitchen aspirin 81 MG tablet Take 81 mg by mouth daily.      Marland Kitchen doxazosin (CARDURA) 4 MG tablet TAKE 1 TABLET BY MOUTH AT BEDTIME 30 tablet 3  . metoprolol (LOPRESSOR) 100 MG tablet TAKE 2 TABLETS TWICE A DAY 120 tablet 3  . warfarin (COUMADIN) 5 MG tablet Take as directed by coumadin clinic (Patient taking differently: Take 2.5-5 mg by mouth daily. Take 2.5 mg (half a tablet) on Tuesday, and take 5 (a whole tablet) on all other days of the week.(Take as directed by coumadin clinic)) 90 tablet 0  . potassium chloride SA (K-DUR,KLOR-CON) 20 MEQ tablet Take 1 tablet (20 mEq total) by mouth daily. (Patient not taking: Reported on 06/04/2014) 2 tablet 0    Objective: BP 198/90 mmHg  Pulse 77  Temp(Src) 98.3 F (36.8 C) (Oral)  Resp 21  SpO2 96% Exam: General: Pleasant elderly woman speaking with family in no distress HEENT: Bilateral cataracts and arcus senilis, no scleral icterus, oropharynx clear, moist.  Neck: Very prominent JVD Cardiovascular: Irreg irreg, IV/VI harsh systolic murmur with thrill throughout precordium.  Respiratory: Nonlabored, clear breath sounds Abdomen: Soft, NT, ND, no guarding Extremities: Muscular atrophy throughout, 2+ bilateral LE pitting edema without pain with calf compression Skin: no rashes or wounds Neuro: Alert and oriented, speech slow but normal. Gait not assessed.   Labs and Imaging: CBC BMET   Recent Labs Lab 06/04/14 1505  WBC 7.4  HGB 10.7*  HCT 32.8*  PLT 121*    Recent Labs Lab 06/04/14 1505  NA 136  K 3.1*  CL 106  CO2 18*  BUN 24*   CREATININE 1.37*  GLUCOSE 127*  CALCIUM 8.8     Trop 0.05 BNP: 4042 Bedside Echo: Poor contractility, IVC congestion, no pericardial effusion ECG: Atrial fibrillation with rate ~105 with PVC, RAD, prolonged QTc and T wave inversion in III, flattening in aVF (no prior for comparison)  Patrecia Pour, MD 06/04/2014, 6:58 PM PGY-2, [place] Intern pager: [phone], text pages welcome

## 2014-06-05 DIAGNOSIS — I5041 Acute combined systolic (congestive) and diastolic (congestive) heart failure: Secondary | ICD-10-CM

## 2014-06-05 DIAGNOSIS — I482 Chronic atrial fibrillation: Secondary | ICD-10-CM

## 2014-06-05 DIAGNOSIS — I5033 Acute on chronic diastolic (congestive) heart failure: Secondary | ICD-10-CM | POA: Diagnosis present

## 2014-06-05 DIAGNOSIS — I10 Essential (primary) hypertension: Secondary | ICD-10-CM

## 2014-06-05 DIAGNOSIS — I509 Heart failure, unspecified: Secondary | ICD-10-CM

## 2014-06-05 DIAGNOSIS — W19XXXA Unspecified fall, initial encounter: Secondary | ICD-10-CM

## 2014-06-05 LAB — BASIC METABOLIC PANEL
Anion gap: 12 (ref 5–15)
BUN: 24 mg/dL — AB (ref 6–23)
CHLORIDE: 106 mmol/L (ref 96–112)
CO2: 18 mmol/L — AB (ref 19–32)
Calcium: 8.6 mg/dL (ref 8.4–10.5)
Creatinine, Ser: 1.32 mg/dL — ABNORMAL HIGH (ref 0.50–1.10)
GFR calc Af Amer: 44 mL/min — ABNORMAL LOW (ref >90)
GFR, EST NON AFRICAN AMERICAN: 38 mL/min — AB (ref >90)
Glucose, Bld: 120 mg/dL — ABNORMAL HIGH (ref 70–99)
Potassium: 3.4 mmol/L — ABNORMAL LOW (ref 3.5–5.1)
SODIUM: 136 mmol/L (ref 135–145)

## 2014-06-05 LAB — CBC
HCT: 32.1 % — ABNORMAL LOW (ref 36.0–46.0)
Hemoglobin: 10.8 g/dL — ABNORMAL LOW (ref 12.0–15.0)
MCH: 31.5 pg (ref 26.0–34.0)
MCHC: 33.6 g/dL (ref 30.0–36.0)
MCV: 93.6 fL (ref 78.0–100.0)
PLATELETS: 122 10*3/uL — AB (ref 150–400)
RBC: 3.43 MIL/uL — ABNORMAL LOW (ref 3.87–5.11)
RDW: 16.7 % — ABNORMAL HIGH (ref 11.5–15.5)
WBC: 6.5 10*3/uL (ref 4.0–10.5)

## 2014-06-05 LAB — MAGNESIUM: Magnesium: 1.6 mg/dL (ref 1.5–2.5)

## 2014-06-05 LAB — TROPONIN I
TROPONIN I: 0.06 ng/mL — AB (ref <0.031)
TROPONIN I: 0.04 ng/mL — AB (ref <0.031)

## 2014-06-05 LAB — PROTIME-INR
INR: 3.01 — ABNORMAL HIGH (ref 0.00–1.49)
Prothrombin Time: 31.5 seconds — ABNORMAL HIGH (ref 11.6–15.2)

## 2014-06-05 MED ORDER — POTASSIUM CHLORIDE CRYS ER 20 MEQ PO TBCR
40.0000 meq | EXTENDED_RELEASE_TABLET | Freq: Two times a day (BID) | ORAL | Status: AC
  Administered 2014-06-05 (×2): 40 meq via ORAL
  Filled 2014-06-05 (×2): qty 2

## 2014-06-05 MED ORDER — MAGNESIUM SULFATE 2 GM/50ML IV SOLN
2.0000 g | Freq: Once | INTRAVENOUS | Status: AC
  Administered 2014-06-05: 2 g via INTRAVENOUS
  Filled 2014-06-05: qty 50

## 2014-06-05 MED ORDER — WARFARIN - PHARMACIST DOSING INPATIENT
Freq: Every day | Status: DC
  Administered 2014-06-05 – 2014-06-08 (×3)

## 2014-06-05 MED ORDER — METOPROLOL TARTRATE 100 MG PO TABS
100.0000 mg | ORAL_TABLET | Freq: Two times a day (BID) | ORAL | Status: DC
  Administered 2014-06-05 – 2014-06-06 (×2): 100 mg via ORAL
  Filled 2014-06-05 (×3): qty 1

## 2014-06-05 MED ORDER — WARFARIN SODIUM 2.5 MG PO TABS
2.5000 mg | ORAL_TABLET | Freq: Once | ORAL | Status: AC
  Administered 2014-06-05: 2.5 mg via ORAL
  Filled 2014-06-05: qty 1

## 2014-06-05 MED ORDER — DOXAZOSIN MESYLATE 4 MG PO TABS
4.0000 mg | ORAL_TABLET | Freq: Every day | ORAL | Status: DC
  Administered 2014-06-05: 4 mg via ORAL
  Filled 2014-06-05 (×3): qty 1

## 2014-06-05 MED ORDER — HYDRALAZINE HCL 25 MG PO TABS
25.0000 mg | ORAL_TABLET | Freq: Three times a day (TID) | ORAL | Status: DC
  Administered 2014-06-05 – 2014-06-07 (×6): 25 mg via ORAL
  Filled 2014-06-05 (×9): qty 1

## 2014-06-05 NOTE — Progress Notes (Signed)
ANTICOAGULATION CONSULT NOTE - Initial Consult  Pharmacy Consult for Warfarin  Indication: atrial fibrillation  No Known Allergies  Patient Measurements: Height: 5\' 5"  (165.1 cm) Weight: 160 lb 15 oz (73 kg) IBW/kg (Calculated) : 57  Labs:  Recent Labs  06/04/14 1505 06/04/14 2154 06/05/14 0314 06/05/14 0948  HGB 10.7*  --  10.8*  --   HCT 32.8*  --  32.1*  --   PLT 121*  --  122*  --   LABPROT  --  35.4* 31.5*  --   INR  --  3.50* 3.01*  --   CREATININE 1.37*  --  1.32*  --   TROPONINI  --  0.06* 0.06* 0.04*   Medical History: Past Medical History  Diagnosis Date  . Atrial fibrillation   . Hyperlipidemia   . Hypertension    Assessment: [name] on warfarin for afib (CHADSVASc 4), INR is supra-therapeutic on admission at 3.5 and warfarin held. INR now down to 3.01. H/H and plt low but stable with no reported s/s of significant bleeding.  Home dose: 5 mg daily except 2.5 mg on Tuesday, last dose on 4/15 (last clinic visit INR was 2.8- 3/16)   Goal of Therapy:  INR 2-3 Monitor platelets by anticoagulation protocol: Yes   Plan:  - Coumadin 2.5 mg PO x 1 tonight (lower dose given SUPRAtherapeutic INR on admit) - Daily PT/INR - Monitor for bleeding  Kaicee Scarpino K. Velva Harman, PharmD, [place] Clinical Pharmacist - Resident Pager: [phone] Pharmacy: [phone] 06/05/2014 12:12 PM

## 2014-06-05 NOTE — Consult Note (Signed)
CARDIOLOGY CONSULT NOTE     Jennifer Hinton MRN: [id number] DOB/AGE: 77-14-1937 77 y.o.  Admit Date: 06/04/2014 Referring Physician: Teaching Service Jennifer Hinton Primary Physician: Jennifer Hinton Consulting Cardiologist: Jennifer Hinton Primary Cardiologist:  Reason for Consultation: CHF Atrial fib  Clinical Summary Ms. Jennifer Hinton is a 79 y.o.female with known history of atrial fib, followed in our coumadin clinic CHADS VASC Score of 3. severe tricuspid regurgitation, hypertension, and hyperlipidemia admitted with worsening DOE. Family members noticed this and insisted she come to ER. She admits to eating salty foods at home. Niece cooks for her. She denies edema, chest pain or rapid palpitations. Main complaint of bilateral knee pain.   In ER she was found to be hypertensive, 213/147, Hgb of 10.7, HCT 32.8, K+ 3.1, Creatinine 1.37, Pro-BNP 4042, CXR Cardiomyopathy, no evidence of pneumonia or pulmonary edema. She was treated with NTG paste, lasix 40 mg IV, metoprolol 100 mg. INR on admission elevated at 3.50. Today 3.01.   No Known Allergies  Medications Scheduled Medications: . aspirin EC  81 mg Oral Daily  . doxazosin  4 mg Oral QHS  . furosemide  40 mg Intravenous BID  . metoprolol  200 mg Oral BID  . potassium chloride  40 mEq Oral BID  . sodium chloride  3 mL Intravenous Q12H      PRN Medications: sodium chloride, acetaminophen, hydrALAZINE, sodium chloride   Past Medical History  Diagnosis Date  . Atrial fibrillation   . Hyperlipidemia   . Hypertension     History reviewed. No pertinent past surgical history.  Family History  Problem Relation Age of Onset  . Heart disease Brother   . Hyperlipidemia Brother     Social History Ms. Jennifer Hinton reports that she has never smoked. She has never used smokeless tobacco. Ms. Jennifer Hinton reports that she does not drink alcohol.  Review of Systems Complete review of systems are found to be negative unless outlined in H&P  above.  Physical Examination Blood pressure 165/86, pulse 94, temperature 97.4 F (36.3 C), temperature source Oral, resp. rate 18, height 5\' 5"  (1.651 m), weight 160 lb 15 oz (73 kg), SpO2 100 %.  Intake/Output Summary (Last 24 hours) at 06/05/14 1106 Last data filed at 06/05/14 1000  Gross per 24 hour  Intake    423 ml  Output   3870 ml  Net  -3447 ml    Telemetry: Atrial fibrillation rate in the 70's.   [id number] but comfortable. HEENT: Conjunctiva and lids normal, oropharynx clear with moist mucosa. Neck: Supple, elevated pulsatile  JVP on the right carotid bruits, no thyromegaly. Lungs: Clear to auscultation, nonlabored breathing at rest. Cardiac: Iregular rate and rhythm, 2/3 holosystolic murmur, no pericardial rub. Abdomen: Soft, nontender, no hepatomegaly, bowel sounds present, no guarding or rebound. Extremities: No pitting edema, distal pulses 2+. Skin: Warm and dry. Musculoskeletal: No kyphosis. Neuropsychiatric: Alert and oriented x3, affect grossly appropriate. Poor memory.   Prior Cardiac Testing/Procedures  1.Echocardiogram 03/17/2014 Left ventricle: The cavity size was normal. There was moderate concentric hypertrophy. Systolic function was normal. Wall motion was normal; there were no regional wall motion abnormalities. - Aortic valve: There was mild regurgitation. - Aortic root: The aortic root was normal in size. - Mitral valve: Calcified annulus. Mildly thickened leaflets . There was mild regurgitation. - Left atrium: The atrium was severely dilated. - Right ventricle: Systolic function was normal. - Right atrium: The atrium was severely dilated. - Tricuspid valve: There was severe regurgitation. -  Pulmonic valve: There was mild regurgitation. - Pulmonary arteries: Systolic pressure was severely increased. PA peak pressure: 73 mm Hg (S). - Inferior vena cava: The vessel was normal in size. - Pericardium, extracardiac: There was no pericardial  effusion.  Lab Results  Basic Metabolic Panel:  Recent Labs Lab 06/04/14 1505 06/05/14 0314  NA 136 136  K 3.1* 3.4*  CL 106 106  CO2 18* 18*  GLUCOSE 127* 120*  BUN 24* 24*  CREATININE 1.37* 1.32*  CALCIUM 8.8 8.6  MG  --  1.6    Liver Function Tests:  Recent Labs Lab 06/04/14 1505  AST 27  ALT 13  ALKPHOS 47  BILITOT 1.7*  PROT 6.9  ALBUMIN 3.0*    CBC:  Recent Labs Lab 06/04/14 1505 06/05/14 0314  WBC 7.4 6.5  HGB 10.7* 10.8*  HCT 32.8* 32.1*  MCV 94.5 93.6  PLT 121* 122*    Cardiac Enzymes:  Recent Labs Lab 06/04/14 2154 06/05/14 0314  TROPONINI 0.06* 0.06*    BNP: 4042.0   Radiology: Dg Chest Portable 1 View  06/04/2014   CLINICAL DATA:  Weakness and pain in both legs. History of congestive heart failure.  EXAM: PORTABLE CHEST - 1 VIEW  COMPARISON:  None.  FINDINGS: There is moderate enlargement of the cardiopericardial silhouette. No mediastinal or hilar masses or convincing adenopathy.  Lungs are mildly hyperexpanded. There is no lung consolidation or edema. No pleural effusion or pneumothorax.  Bony thorax is demineralized but grossly intact.  IMPRESSION: Significant enlargement of the cardiopericardial silhouette. This may all be cardiomegaly. Consider pericardial effusion in the proper clinical setting.  No evidence of pneumonia or pulmonary edema.   Electronically Signed   By: Jennifer Hinton M.D.   On: 06/04/2014 15:20     ECG: Atrial fibrillation rate of 105.    Impression and Recommendations  1. Atrial fibrillation: Heart rate was elevated on admission but now better controlled. She denies medical non-compliance. CHADS VASC Score of 3. INR of 3.01. She is on metoprolol 100 mg BID from 200 mg BID.  Pharmacy for coumadin dosing. Repeat echo.   2. Diastolic CHF: Likely from elevated HR, dietary non-compliance and hypertension. She has diuresed 2.250 liters over last 24 hours, and 3.5 liters since admission.  3. Hypertension: BP is not  completely controlled.  She is on hydralazine prn,, Cardura, and NTG 2% paste. Can consider hydralazine 10 mg TID. Creatinine is 1.32.   4. Severe Tricuspid Regurgitation: Repeating her echo. Denies chest pain. May be contributing to chronic dyspnea. When diuresed will reassess her status for need for home O2.   4. Hyperkalemia: Being repleted. Magnesium is 1.6. Will give IV replacement X1.   Signed: Phill Myron. Lawrence NP [place]  06/05/2014, 11:06 AM Co-Sign Hinton  As above, patient seen and examined. Briefly she is a 79 year old female with past medical history of permanent atrial fibrillation, hypertension, hyperlipidemia admitted with acute diastolic congestive heart failure and hypertensive urgency. Patient states that her daughter brought her to the hospital because she appeared to be dyspneic. There was no chest pain or palpitations. No edema. In the emergency room blood pressure was elevated at 213/147 and creatinine 1.37. Electrocardiogram shows atrial fibrillation, PVC, right axis deviation.  1 acute diastolic congestive heart failure-agree with Lasix 40 mg IV twice a day. Follow renal function. Repeat echocardiogram. 2 permanent atrial fibrillation-decrease metoprolol to 100 mg by mouth twice a day. If heart rate increases on that regimen could add Cardizem. CHADSvasc 4; continue  coumadin. 3 hypertension-given decrease metoprolol dose would add hydralazine 25 mg by mouth 3 times a day. Follow blood pressure and titrate medications as needed for control. If LV function reduced on echocardiogram would add ACE inhibitor and follow renal function closely. 4 severe tricuspid regurgitation-she will need follow-up echoes in the future. Hopefully this will improve with blood pressure and heart failure control. Jennifer Hinton

## 2014-06-05 NOTE — Progress Notes (Signed)
Family Medicine Teaching Service Daily Progress Note Intern Pager: [phone]  Patient name: Jennifer Hinton Medical record number: [id number] Date of birth: [birth year] Age: 79 y.o. Gender: female  Primary Care Provider: Tommi Rumps, MD Consultants: cardiology Code Status: FULL  Pt Overview and Major Events to Date:  4/16 - admitted with increased LE swelling, dyspnea on exertion of gradual onset; acute hypertensive urgency - bedside echo in ED by Dr. Evelina Bucy with decreased contractility, dilated IVC, no pericardial effusion / tamponade 4/17 - mild troponin elevation to 0.06 (?demand), cardiology consulted  Assessment and Plan: Jennifer Hinton is a 79 y.o. female presenting with stigmata of heart failure. PMH is significant for HTN, HLD, AFib on coumadin, tricuspid insufficiency.   Acute CHF - echo with normal EF in Jan 2016 but showed LVH, severe biatrial enlargement, and severe TR, with PA HTN - concern for now-frank CHF with pro-BNP to 4000, marked JVD, and bedside echo showing decreased contractility - concern for frankly worsened tricuspid insufficiency as potential etiology for current status - cardiology consult requested 4/17; appreciate assistance - continue diuresis with Lasix 40mg  IV BID, for now - Daily weights, strict I/O - repeat echocardiogram pending - repeat troponin given persistent mild elevation to 0.06 to ensure no continued elevation  Hypertensive urgency: in the ED, 180's / 110's and higher (given Lopressor), improved to 160's / 80's 4/17 - restarted home Lopressor 200 mg BID, Cardura 4 mg qHS - continue hydralazine 20 mg q2 PRN, for now - Norvasc on home med list - holding for now, likely needs to be discontinued completely - monitor labs, as above  CKD stage III: likely mild pre-renal acute kidney injury on chronic CKD with Cr ~1.3 (baseline ~1), in setting of CHF - managing BP as above and treating for CHF - need to balance diuresis with kidney  function - expect kidney perfusion should actually improve with better control of CHF  Hypokalemia: Mild, slightly improved 4/17 - continue to monitor with labs and replete orally as needed, especially while on Lasix  Atrial fibrillation: Rate controlled in ED; pt vague about med compliance but INR therapeutic  - continue Coumadin per pharmacy - Continue Lopressor 200mg  po BID for rate control.   Mild normocytic anemia: mild, Hb stable ~10.8 on 4/17 - Monitor labs as needed - No colonoscopy found in EMR; likely needs outpt GI f/u  FEN/GI: Heart healthy diet; Saline lock IV Prophylaxis: Full dose coumadin  Disposition: management as above; anticipate discharge back home once improved fluid status and BP control is achieved  Subjective: Pt without specific complaints this morning. Denies SOB, cough / chest pain. Leg swelling still prominent but "maybe some better."  Objective: Temp:  [97.1 F (36.2 C)-98.3 F (36.8 C)] 98.2 F (36.8 C) (04/17 0510) Pulse Rate:  [73-101] 85 (04/17 0510) Resp:  [18-30] 18 (04/17 0510) BP: (139-215)/(9-147) 181/83 mmHg (04/17 0510) SpO2:  [95 %-100 %] 96 % (04/17 0510) Weight:  [160 lb 15 oz (73 kg)-162 lb 6.4 oz (73.664 kg)] 160 lb 15 oz (73 kg) (04/17 0510) Physical Exam: General: elderly adult female in NAD, ver pleasant Neck: prominent JVD from clavicle to ~4 cm above Cardio: Irregularly irregular, IV/VI harsh systolic murmur with thrill throughout precordium Pulm: CTAB, unlabored breathing on RA Abdomen: soft, nontender, BS+ Extremities: global muscular wasting, 2+ bilateral LE pitting edema Skin: no rashes or wounds Neuro: Alert and oriented, speech slow but normal. Gait not assessed.   Laboratory:  Recent Labs Lab 06/04/14 1505 06/05/14 [id number]  WBC 7.4 6.5  HGB 10.7* 10.8*  HCT 32.8* 32.1*  PLT 121* 122*    Recent Labs Lab 06/04/14 1505 06/05/14 0314  NA 136 136  K 3.1* 3.4*  CL 106 106  CO2 18* 18*  BUN 24* 24*   CREATININE 1.37* 1.32*  CALCIUM 8.8 8.6  PROT 6.9  --   BILITOT 1.7*  --   ALKPHOS 47  --   ALT 13  --   AST 27  --   GLUCOSE 127* 120*    Recent Labs Lab 06/04/14 2154 06/05/14 0314  INR 3.50* 3.01*    Recent Labs Lab 06/04/14 2154 06/05/14 0314  TROPONINI 0.06* 0.06*   BNP 4/16 @1505 : 4042  Imaging/Diagnostic Tests: ED EKG: afib, PVC's, inferior T-waves flattened ED bedside echocardiogram: CXR (port) 4/16 @1514 : significant cardiomegaly, no consolidation or pulmonary edema  Emmaline Kluver, MD 06/05/2014, 10:06 AM PGY-3, [place] Intern pager: [phone], text pages welcome

## 2014-06-05 NOTE — Evaluation (Signed)
Physical Therapy Evaluation Patient Details Name: Jennifer Hinton MRN: [id number] DOB: 02/11/1936 Today's Date: 06/05/2014   History of Present Illness  Patient is a 79 yo female admitted 06/04/14 with LE swelling.  Patient with CHF exacerbation and HTN.  PMH:  CHF, HTN, Afib, tricuspid insufficiency, h/o falls  Clinical Impression  Patient presents with problems listed below.  Will benefit from acute PT to maximize functional independence prior to discharge home.  Recommend HHPT and HH Aide at discharge.  If patient unable to achieve Mod I functional level, may need to consider SNF.    Follow Up Recommendations Home health PT;Supervision for mobility/OOB ([place] for ADL's)    Equipment Recommendations  3in1 (PT) (Rollator (4-wheeled walker))    Recommendations for Other Services       Precautions / Restrictions Precautions Precautions: Fall Restrictions Weight Bearing Restrictions: No      Mobility  Bed Mobility Overal bed mobility: Needs Assistance Bed Mobility: Supine to Sit;Sit to Supine     Supine to sit: Min guard;Min assist Sit to supine: Mod assist   General bed mobility comments: Verbal cues for technique.  Patient able to move to sitting position with no physical assist.  Did require min assist to scoot to EOB.  Mod assist to bring LE's onto bed to return to supine.  Transfers Overall transfer level: Needs assistance Equipment used: Rolling walker (2 wheeled) Transfers: Sit to/from Stand Sit to Stand: Min assist         General transfer comment: Verbal cues for hand placement.  Assist to rise to standing from bed.  Patient required 2 attempts before coming to standing.    Ambulation/Gait Ambulation/Gait assistance: Min assist Ambulation Distance (Feet): 40 Feet Assistive device: Rolling walker (2 wheeled) Gait Pattern/deviations: Step-through pattern;Decreased step length - right;Decreased step length - left;Decreased stride length;Shuffle;Trunk  flexed;Narrow base of support Gait velocity: Decreased Gait velocity interpretation: Below normal speed for age/gender General Gait Details: Verbal cues for safe use of RW.  Cues to stand upright - patient with flexed posture.  Narrow base of support.  Encouraged patient to take longer steps with feet apart.  Stairs            Wheelchair Mobility    Modified Rankin (Stroke Patients Only)       Balance                                             Pertinent Vitals/Pain Pain Assessment: No/denies pain    Home Living Family/patient expects to be discharged to:: Private residence Living Arrangements: Other relatives (Granddaughter) Available Help at Discharge: Family;Available PRN/intermittently (Granddaughter works) Type of Home: House Home Access: Stairs to enter Entrance Stairs-Rails: None Technical brewer of Steps: 4 Home Layout: One level Home Equipment: Cane - single point      Prior Function Level of Independence: Independent with assistive device(s);Needs assistance   Gait / Transfers Assistance Needed: Using cane pta.  Has had several falls  ADL's / Homemaking Assistance Needed: Takes sink baths.  Granddaughter prepares meals.  Patient able to make sandwich.        Hand Dominance        Extremity/Trunk Assessment   Upper Extremity Assessment: Generalized weakness ([place] Lt upper arm)           Lower Extremity Assessment: Generalized weakness      Cervical / Trunk  Assessment: Kyphotic  Communication   Communication: No difficulties  Cognition Arousal/Alertness: Awake/alert Behavior During Therapy: Flat affect Overall Cognitive Status: Within Functional Limits for tasks assessed                      General Comments      Exercises        Assessment/Plan    PT Assessment Patient needs continued PT services  PT Diagnosis Difficulty walking;Abnormality of gait;Generalized weakness   PT Problem List  Decreased strength;Decreased activity tolerance;Decreased balance;Decreased mobility;Decreased knowledge of use of DME;Cardiopulmonary status limiting activity  PT Treatment Interventions DME instruction;Gait training;Functional mobility training;Therapeutic activities;Patient/family education   PT Goals (Current goals can be found in the Care Plan section) Acute Rehab PT Goals Patient Stated Goal: To go home PT Goal Formulation: With patient/family Time For Goal Achievement: 06/12/14 Potential to Achieve Goals: Good    Frequency Min 3X/week   Barriers to discharge Decreased caregiver support Patient does not have 24 hour assist.    Co-evaluation               End of Session Equipment Utilized During Treatment: Gait belt Activity Tolerance: Patient limited by fatigue Patient left: in bed;with call bell/phone within reach;with family/visitor present Nurse Communication: Mobility status         Time: [id number] PT Time Calculation (min) (ACUTE ONLY): 29 min   Charges:   PT Evaluation $Initial PT Evaluation Tier I: 1 Procedure PT Treatments $Gait Training: 8-22 mins   PT G CodesDespina Pole 06-23-2014, 6:46 PM Carita Pian. Sanjuana Kava, [place] Pager [phone]

## 2014-06-06 DIAGNOSIS — I272 Pulmonary hypertension, unspecified: Secondary | ICD-10-CM | POA: Diagnosis present

## 2014-06-06 DIAGNOSIS — I509 Heart failure, unspecified: Secondary | ICD-10-CM

## 2014-06-06 DIAGNOSIS — I119 Hypertensive heart disease without heart failure: Secondary | ICD-10-CM | POA: Diagnosis present

## 2014-06-06 DIAGNOSIS — R0600 Dyspnea, unspecified: Secondary | ICD-10-CM

## 2014-06-06 DIAGNOSIS — E440 Moderate protein-calorie malnutrition: Secondary | ICD-10-CM | POA: Diagnosis present

## 2014-06-06 DIAGNOSIS — N183 Chronic kidney disease, stage 3 unspecified: Secondary | ICD-10-CM | POA: Diagnosis present

## 2014-06-06 DIAGNOSIS — Z7901 Long term (current) use of anticoagulants: Secondary | ICD-10-CM

## 2014-06-06 DIAGNOSIS — R600 Localized edema: Secondary | ICD-10-CM

## 2014-06-06 DIAGNOSIS — I071 Rheumatic tricuspid insufficiency: Secondary | ICD-10-CM | POA: Diagnosis present

## 2014-06-06 DIAGNOSIS — I5031 Acute diastolic (congestive) heart failure: Secondary | ICD-10-CM

## 2014-06-06 LAB — PROTIME-INR
INR: 2.56 — ABNORMAL HIGH (ref 0.00–1.49)
Prothrombin Time: 27.7 seconds — ABNORMAL HIGH (ref 11.6–15.2)

## 2014-06-06 LAB — BASIC METABOLIC PANEL
Anion gap: 11 (ref 5–15)
BUN: 23 mg/dL (ref 6–23)
CO2: 19 mmol/L (ref 19–32)
Calcium: 8.7 mg/dL (ref 8.4–10.5)
Chloride: 107 mmol/L (ref 96–112)
Creatinine, Ser: 1.35 mg/dL — ABNORMAL HIGH (ref 0.50–1.10)
GFR calc Af Amer: 43 mL/min — ABNORMAL LOW (ref >90)
GFR calc non Af Amer: 37 mL/min — ABNORMAL LOW (ref >90)
GLUCOSE: 118 mg/dL — AB (ref 70–99)
POTASSIUM: 3.9 mmol/L (ref 3.5–5.1)
SODIUM: 137 mmol/L (ref 135–145)

## 2014-06-06 LAB — MAGNESIUM: Magnesium: 1.8 mg/dL (ref 1.5–2.5)

## 2014-06-06 MED ORDER — FUROSEMIDE 40 MG PO TABS
40.0000 mg | ORAL_TABLET | Freq: Two times a day (BID) | ORAL | Status: DC
  Administered 2014-06-06 – 2014-06-07 (×2): 40 mg via ORAL
  Filled 2014-06-06 (×4): qty 1

## 2014-06-06 MED ORDER — ENSURE ENLIVE PO LIQD
237.0000 mL | Freq: Two times a day (BID) | ORAL | Status: DC
  Administered 2014-06-06 – 2014-06-08 (×5): 237 mL via ORAL

## 2014-06-06 MED ORDER — CARVEDILOL 3.125 MG PO TABS
3.1250 mg | ORAL_TABLET | Freq: Two times a day (BID) | ORAL | Status: DC
  Filled 2014-06-06 (×2): qty 1

## 2014-06-06 MED ORDER — CARVEDILOL 6.25 MG PO TABS
6.2500 mg | ORAL_TABLET | Freq: Two times a day (BID) | ORAL | Status: DC
  Administered 2014-06-06 – 2014-06-07 (×2): 6.25 mg via ORAL
  Filled 2014-06-06 (×4): qty 1

## 2014-06-06 NOTE — Progress Notes (Signed)
  Echocardiogram 2D Echocardiogram has been performed.  Jennifer Hinton 06/06/2014, 3:03 PM

## 2014-06-06 NOTE — Progress Notes (Signed)
Family Medicine Teaching Service Daily Progress Note Intern Pager: [phone]  Patient name: Jennifer Hinton Medical record number: [id number] Date of birth: 03-Feb-1936 Age: 79 y.o. Gender: female  Primary Care Provider: Tommi Rumps, MD Consultants: cardiology Code Status: FULL  Pt Overview and Major Events to Date:  4/16 - admitted with increased LE swelling, dyspnea on exertion of gradual onset; acute hypertensive urgency - bedside echo in ED by Dr. Evelina Bucy with decreased contractility, dilated IVC, no pericardial effusion / tamponade 4/17 - mild troponin elevation to 0.06 (?demand), cardiology consulted  Assessment and Plan: 79 y.o. female presenting with stigmata of heart failure (leg swelling, dyspnea with exertion). PMH is significant for HTN, HLD, AFib on coumadin, tricuspid insufficiency.   # Acute CHF - echo with normal EF in Jan 2016 but showed LVH, severe biatrial enlargement, and severe TR, with PA HTN - Pro-BNP to 4000 - Troponin 0.06 >0.04 - Bedside Echo in ED: decreased contractility, dilated IVC, no pericardial effusion/tamponade - Follow up Echocardiogram - CXR: enlargement of cardiopericardial silhouette, concerning for pericardial effusion (ruled out per bedside echo described above), no pneumonia or pulmonary edema - Cardiology consult requested 4/17; appreciate assistance - Continue diuresis with Lasix 40mg  IV BID--Transition to oral diuretic. Will decrease dose to Lasix 40mg  PO BID and monitor output - Daily weights, strict I/O  - 162 pounds at admission >> 155pounds  - 160 pounds at office visit in January 2015  - Down 4L since admission  # Hypertensive urgency: in the ED, 180's / 110's and higher (given Lopressor), improved to 160's / 80's 4/17 - 174/92 this am - Continue home Lopressor 200 mg BID, Cardura 4 mg qHS, Hydralazine 25mg  q8 hr--discontinue Cardura due to association with HF. Transition from Lopressor to Coreg. - Continue hydralazine 20 mg q2  PRN - Cardiology consulted, appreciate recommendations.  - Holding Norvasc--consider discontinuing at discharge  # CKD stage III: likely mild pre-renal acute kidney injury on chronic CKD with Cr ~1.37 (baseline ~1), in setting of CHF - Creatinine 1.35 - Managing BP as above and treating for CHF - Need to balance diuresis with kidney function - Expect kidney perfusion should actually improve with better control of CHF  # Atrial fibrillation: Rate controlled in ED; pt vague about med compliance but INR therapeutic  - Continue Coumadin per pharmacy - Continue Lopressor 200mg  po BID for rate control.   Mild normocytic anemia: mild, Hb stable ~10.8 on 4/17 - Hemoglobin 10.8 - Monitor labs as needed - No colonoscopy found in EMR; likely needs outpt GI f/u  FEN/GI: Heart healthy diet; Saline lock IV Prophylaxis: Full dose coumadin  Disposition: management as above; anticipate discharge back home once improved fluid status and BP control is achieved  Subjective:  Feeling much better today. States her edema is significantly improved. No longer feels short of breath with ambulation. States she lives with her granddaughter and great-grandson. Is very independent in the home,  However her granddaughter cooks her meals.  Objective: Temp:  [97.4 F (36.3 C)-98.8 F (37.1 C)] 98.6 F (37 C) (04/18 0543) Pulse Rate:  [73-94] 75 (04/18 0543) Resp:  [18-20] 18 (04/18 0543) BP: (150-179)/(83-92) 174/92 mmHg (04/18 0543) SpO2:  [92 %-100 %] 96 % (04/18 0543) Weight:  [155 lb 14.4 oz (70.716 kg)] 155 lb 14.4 oz (70.716 kg) (04/18 0543) Physical Exam: General: 80yo female resting comfortably in no apparent distress Cardio: Irregularly irregular, IV/VI harsh systolic murmur with thrill throughout precordium Pulm: CTAB, unlabored breathing on RA Abdomen:  soft, nontender, BS+ Extremities: global muscular wasting, 1+ bilateral LE pitting edema Skin: no rashes or wounds Neuro: Alert and oriented.  Gait not assessed.   Laboratory:  Recent Labs Lab 06/04/14 1505 06/05/14 0314  WBC 7.4 6.5  HGB 10.7* 10.8*  HCT 32.8* 32.1*  PLT 121* 122*    Recent Labs Lab 06/04/14 1505 06/05/14 0314 06/06/14 0413  NA 136 136 137  K 3.1* 3.4* 3.9  CL 106 106 107  CO2 18* 18* 19  BUN 24* 24* 23  CREATININE 1.37* 1.32* 1.35*  CALCIUM 8.8 8.6 8.7  PROT 6.9  --   --   BILITOT 1.7*  --   --   ALKPHOS 47  --   --   ALT 13  --   --   AST 27  --   --   GLUCOSE 127* 120* 118*    Recent Labs Lab 06/04/14 2154 06/05/14 0314 06/06/14 0413  INR 3.50* 3.01* 2.56*    Recent Labs Lab 06/04/14 2154 06/05/14 0314 06/05/14 0948  TROPONINI 0.06* 0.06* 0.04*   BNP 4/16 @1505 : [id number]  Imaging/Diagnostic Tests: ED EKG: afib, PVC's, inferior T-waves flattened ED bedside echocardiogram: CXR (port) 4/16 @1514 : significant cardiomegaly, no consolidation or pulmonary edema  Lorna Few, DO 06/06/2014, 6:33 AM PGY-1, [place] Intern pager: [phone], text pages welcome

## 2014-06-06 NOTE — Evaluation (Signed)
Occupational Therapy Evaluation Patient Details Name: Jennifer Hinton MRN: [id number] DOB: May 29, 1935 Today's Date: 06/06/2014    History of Present Illness Patient is a 79 yo female admitted 06/04/14 with LE swelling.  Patient with CHF exacerbation and HTN.  PMH:  CHF, HTN, Afib, tricuspid insufficiency, h/o falls   Clinical Impression   Pt was able to sponge bathe,dress, toilet and prepare a simple meal prior to admission. She was using a cane, but states it was not very helpful.  Pt states she has a sedentary lifestyle because she has nothing to do.  She does not drive, only leaves home for church and MD appointments.  Pt presents with generalized weakness, decreased activity tolerance and impaired balance interfering with ability to perform ADL and ADL transfers safely.  Will follow acutely.    Follow Up Recommendations  Home health OT    Equipment Recommendations  3 in 1 bedside comode    Recommendations for Other Services       Precautions / Restrictions Precautions Precautions: Fall Restrictions Weight Bearing Restrictions: No      Mobility Bed Mobility Overal bed mobility: Needs Assistance Bed Mobility: Supine to Sit     Supine to sit: Supervision;HOB elevated     General bed mobility comments: no physical assist, extra time  Transfers Overall transfer level: Needs assistance   Transfers: Sit to/from Stand;Stand Pivot Transfers Sit to Stand: Min guard Stand pivot transfers: Min assist       General transfer comment: uses momentum to stand, stood on first attempt, pt with arthritic knees and thinks a RW at home would be helpful    Balance                                            ADL Overall ADL's : Needs assistance/impaired Eating/Feeding: Independent;Sitting   Grooming: Wash/dry hands;Wash/dry face;Set up;Sitting   Upper Body Bathing: Set up;Sitting   Lower Body Bathing: Min guard;Sit to/from stand   Upper Body Dressing : Set  up;Sitting   Lower Body Dressing: Min guard;Sit to/from stand   Toilet Transfer: Min guard;Stand-pivot   Toileting- Water quality scientist and Hygiene: Min guard;Sit to/from stand               Vision     Perception     Praxis      Pertinent Vitals/Pain Pain Assessment: No/denies pain     Hand Dominance Right   Extremity/Trunk Assessment Upper Extremity Assessment Upper Extremity Assessment: Generalized weakness   Lower Extremity Assessment Lower Extremity Assessment: Defer to PT evaluation   Cervical / Trunk Assessment Cervical / Trunk Assessment: Kyphotic   Communication Communication Communication: No difficulties   Cognition Arousal/Alertness: Awake/alert Behavior During Therapy: WFL for tasks assessed/performed Overall Cognitive Status: Within Functional Limits for tasks assessed                     General Comments       Exercises       Shoulder Instructions      Home Living Family/patient expects to be discharged to:: Private residence Living Arrangements: Other relatives (granddaughter, great grandson) Available Help at Discharge: Family;Available PRN/intermittently (granddaughter works, grandson is school age) Type of Home: House Home Access: Stairs to enter Technical brewer of Steps: 4 Entrance Stairs-Rails: None Home Layout: One level     Bathroom Shower/Tub: Teacher, early years/pre: Standard  Home Equipment: Kasandra Knudsen - single point          Prior Functioning/Environment Level of Independence: Independent with assistive device(s);Needs assistance  Gait / Transfers Assistance Needed: Using cane pta.  Has had several falls ADL's / Homemaking Assistance Needed: Takes sink baths.  Granddaughter prepares meals.  Patient able to make sandwich.        OT Diagnosis: Generalized weakness   OT Problem List: Impaired balance (sitting and/or standing);Decreased activity tolerance;Decreased strength;Decreased  knowledge of use of DME or AE   OT Treatment/Interventions: Self-care/ADL training;DME and/or AE instruction;Therapeutic activities;Patient/family education;Balance training    OT Goals(Current goals can be found in the care plan section) Acute Rehab OT Goals Patient Stated Goal: To go home OT Goal Formulation: With patient Time For Goal Achievement: 06/20/14 Potential to Achieve Goals: Good ADL Goals Pt Will Perform Grooming: with modified independence;standing Pt Will Perform Lower Body Bathing: with modified independence;sit to/from stand Pt Will Perform Lower Body Dressing: with modified independence;sit to/from stand Pt Will Transfer to Toilet: with modified independence;ambulating Pt Will Perform Toileting - Clothing Manipulation and hygiene: with modified independence;sit to/from stand Additional ADL Goal #1: Pt will be aware of available DME for safe showering.  OT Frequency: Min 2X/week   Barriers to D/C:            Co-evaluation              End of Session Nurse Communication: Mobility status  Activity Tolerance: Patient tolerated treatment well Patient left: in chair;with call bell/phone within reach;with chair alarm set;with nursing/sitter in room   Time: [id number] OT Time Calculation (min): 17 min Charges:  OT General Charges $OT Visit: 1 Procedure OT Evaluation $Initial OT Evaluation Tier I: 1 Procedure G-Codes:    Malka So 06/06/2014, 10:50 AM  [phone]

## 2014-06-06 NOTE — Care Management Note (Unsigned)
    Page 1 of 1   06/06/2014     2:34:06 PM CARE MANAGEMENT NOTE 06/06/2014  Patient:  JAZEL, SUDDERTH   Account Number:  [id number]  Date Initiated:  06/06/2014  Documentation initiated by:  Dishawn Bhargava  Subjective/Objective Assessment:   Pt adm on 06/04/14 with respiratory failure, CHF, HTN crisis.  PTA, pt resides at home with daughter.     Action/Plan:   PT/OT recommending HH follow up at dc, as well as RW and BSC for home.  Would also recommend [facility] for disease mgmt of CHF.  MD: please leave orders if you agree.   Anticipated DC Date:  06/08/2014   Anticipated DC Plan:  [place]  CM consult      Choice offered to / List presented to:             Status of service:  In process, will continue to follow Medicare Important Message given?   (If response is "NO", the following Medicare IM given date fields will be blank) Date Medicare IM given:   Medicare IM given by:   Date Additional Medicare IM given:   Additional Medicare IM given by:    Discharge Disposition:    Per UR Regulation:  Reviewed for med. necessity/level of care/duration of stay  If discussed at [place] of Stay Meetings, dates discussed:    Comments:

## 2014-06-06 NOTE — Progress Notes (Signed)
ANTICOAGULATION CONSULT NOTE   Pharmacy Consult for Warfarin  Indication: atrial fibrillation  No Known Allergies  Patient Measurements: Height: 5\' 5"  (165.1 cm) Weight: 155 lb 14.4 oz (70.716 kg) IBW/kg (Calculated) : 57  Labs:  Recent Labs  06/04/14 1505 06/04/14 2154 06/05/14 0314 06/05/14 0948 06/06/14 0413  HGB 10.7*  --  10.8*  --   --   HCT 32.8*  --  32.1*  --   --   PLT 121*  --  122*  --   --   LABPROT  --  35.4* 31.5*  --  27.7*  INR  --  3.50* 3.01*  --  2.56*  CREATININE 1.37*  --  1.32*  --  1.35*  TROPONINI  --  0.06* 0.06* 0.04*  --    Medical History: Past Medical History  Diagnosis Date  . Atrial fibrillation   . Hyperlipidemia   . Hypertension    Assessment: [name] on warfarin for afib (CHADSVASc 4), INR is supra-therapeutic on admission at 3.5 and warfarin held. INR now 2.56 . H/H and plt low but stable with no reported s/s of significant bleeding.  Home dose: 5 mg daily except 2.5 mg on Tuesday, last dose on 4/15 (last clinic visit INR was 2.8- 3/16)   Goal of Therapy:  INR 2-3 Monitor platelets by anticoagulation protocol: Yes   Plan:  - Coumadin 2.5 mg PO x 1 tonight  - Daily PT/INR - Monitor for bleeding  Thanks for allowing pharmacy to be a part of this patient's care.  Excell Seltzer, PharmD Clinical Pharmacist, [phone] 06/06/2014 10:01 AM

## 2014-06-06 NOTE — Progress Notes (Signed)
Physical Therapy Treatment Patient Details Name: Jennifer Hinton MRN: [id number] DOB: 1935/04/21 Today's Date: 06/06/2014    History of Present Illness Patient is a 79 yo female admitted 06/04/14 with LE swelling.  Patient with CHF exacerbation and HTN.  PMH:  CHF, HTN, Afib, tricuspid insufficiency, h/o falls    PT Comments    Pt able to increase ambulation distance today with improved step length.  Pt with vague c/o knee pain.  Daughter present and stated they would provide pt with whatever A she needed when she goes home.  Follow Up Recommendations  Home health PT;Supervision for mobility/OOB     Equipment Recommendations  3in1 (PT);Other (comment) (rollator?)    Recommendations for Other Services       Precautions / Restrictions Precautions Precautions: Fall Restrictions Weight Bearing Restrictions: No    Mobility  Bed Mobility Overal bed mobility: Needs Assistance Bed Mobility: Supine to Sit     Supine to sit: Supervision;HOB elevated     General bed mobility comments: no physical assist, extra time  Transfers Overall transfer level: Needs assistance Equipment used: Rolling walker (2 wheeled) Transfers: Sit to/from Stand Sit to Stand: Min guard Stand pivot transfers: Min assist       General transfer comment: cues for proper hand placement  Ambulation/Gait Ambulation/Gait assistance: Min guard Ambulation Distance (Feet): 80 Feet Assistive device: Rolling walker (2 wheeled) Gait Pattern/deviations: Trunk flexed;Step-through pattern;Decreased stride length Gait velocity: Decreased   General Gait Details: Pt had some difficulty ambulating in tight spaces with RW, but did better on straightaways.   Stairs            Wheelchair Mobility    Modified Rankin (Stroke Patients Only)       Balance Overall balance assessment: Needs assistance   Sitting balance-Leahy Scale: Good       Standing balance-Leahy Scale: Fair Standing balance comment:  more stable with RW                    Cognition Arousal/Alertness: Awake/alert Behavior During Therapy: WFL for tasks assessed/performed Overall Cognitive Status: Within Functional Limits for tasks assessed                      Exercises General Exercises - Lower Extremity Long Arc Quad: Strengthening;Both;10 reps;Seated Hip ABduction/ADduction: Strengthening;Both;10 reps;Seated Hip Flexion/Marching: Strengthening;Both;Seated    General Comments        Pertinent Vitals/Pain Pain Assessment: Faces (vague c/o knee pain) Faces Pain Scale: Hurts a little bit Pain Location: knees Pain Descriptors / Indicators: Aching Pain Intervention(s): Limited activity within patient's tolerance    Home Living Family/patient expects to be discharged to:: Private residence Living Arrangements: Other relatives (granddaughter, great grandson) Available Help at Discharge: Family;Available PRN/intermittently (granddaughter works, grandson is school age) Type of Home: House Home Access: Stairs to enter Entrance Stairs-Rails: None Home Layout: One level Home Equipment: Kasandra Knudsen - single point      Prior Function Level of Independence: Independent with assistive device(s);Needs assistance  Gait / Transfers Assistance Needed: Using cane pta.  Has had several falls ADL's / Homemaking Assistance Needed: Takes sink baths.  Granddaughter prepares meals.  Patient able to make sandwich.     PT Goals (current goals can now be found in the care plan section) Acute Rehab PT Goals Patient Stated Goal: To go home PT Goal Formulation: With patient/family Time For Goal Achievement: 06/12/14 Potential to Achieve Goals: Good Progress towards PT goals: Progressing toward goals    Frequency  Min 3X/week    PT Plan Current plan remains appropriate    Co-evaluation             End of Session Equipment Utilized During Treatment: Gait belt Activity Tolerance: Patient tolerated treatment  well Patient left: in chair;with chair alarm set;with family/visitor present;with call bell/phone within reach     Time: 1133-1150 PT Time Calculation (min) (ACUTE ONLY): 17 min  Charges:  $Gait Training: 8-22 mins                    G Codes:      Corian Handley LUBECK 06/06/2014, 1:12 PM

## 2014-06-06 NOTE — Progress Notes (Signed)
    Subjective:  Sitting up in bed, less SOB, looks frail  Objective:  Vital Signs in the last 24 hours: Temp:  [97.4 F (36.3 C)-98.8 F (37.1 C)] 98.6 F (37 C) (04/18 0543) Pulse Rate:  [73-94] 75 (04/18 0543) Resp:  [18-20] 18 (04/18 0543) BP: (150-179)/(83-92) 174/92 mmHg (04/18 0543) SpO2:  [92 %-100 %] 96 % (04/18 0543) Weight:  [155 lb 14.4 oz (70.716 kg)] 155 lb 14.4 oz (70.716 kg) (04/18 0543)  Intake/Output from previous day:  Intake/Output Summary (Last 24 hours) at 06/06/14 0822 Last data filed at 06/06/14 0644  Gross per 24 hour  Intake    833 ml  Output   3450 ml  Net  -2617 ml   . aspirin EC  81 mg Oral Daily  . carvedilol  3.125 mg Oral BID WC  . furosemide  40 mg Oral BID  . hydrALAZINE  25 mg Oral 3 times per day  . sodium chloride  3 mL Intravenous Q12H  . Warfarin - Pharmacist Dosing Inpatient   Does not apply q1800     Physical Exam: General appearance: alert, cooperative, no distress and thin, frail Neck: JVD Lungs: few basilar crackles on Rt base Heart: irregularly irregular rhythm and 3/6 systolic murmur LSB, marked LV pulsation Extremities: trace edema   Rate: 76  Rhythm: atrial fibrillation  Lab Results:  Recent Labs  06/04/14 1505 06/05/14 0314  WBC 7.4 6.5  HGB 10.7* 10.8*  PLT 121* 122*    Recent Labs  06/05/14 0314 06/06/14 0413  NA 136 137  K 3.4* 3.9  CL 106 107  CO2 18* 19  GLUCOSE 120* 118*  BUN 24* 23  CREATININE 1.32* 1.35*    Recent Labs  06/05/14 0314 06/05/14 0948  TROPONINI 0.06* 0.04*    Recent Labs  06/06/14 0413  INR 2.56*    Imaging: Imaging results have been reviewed  Cardiac Studies: Echo pending   Assessment/Plan:  79 y.o.female with known history of permanent atrial fib, followed in our coumadin clinic CHADS VASC Score of 3. She has severe tricuspid regurgitation, hypertension, HCVD, and hyperlipidemia admitted 06/04/14 with increasing edema and dyspnea.   Principal Problem:  Acute diastolic congestive heart failure Active Problems:   Hypertensive urgency   Tricuspid regurgitation-severe   Permanent atrial fibrillation   Hypertensive cardiovascular disease- LVH   Pulmonary hypertension-severe   Chronic renal disease, stage III   Chronic anticoagulation   Dyslipidemia   Dyspnea   Falls   PLAN:   I/O negative 4.2 L so far. Continue IV Lasix.  ? Consider changing Lopressor to Coreg for better B/P lowering- will see what her B/P is after this am's medications and review with MD.   Kerin Ransom PA-C [place] [phone] 06/06/2014, 8:22 AM    Patient seen and examined. Agree with assessment and plan. Breathing much better. I/O -[id number] since admission. No chest pain. 3/6 honking systolic murmer c/w severe TR; severe pulmonary HTN on 02/2014 echo. Trace edema LE persists. Probably can start coreg at 6.25 bid  with significant htn yesterday; rate in 80 - 90's and normal LV systolic fxn.   Troy Sine, MD, [facility] 06/06/2014 12:35 PM

## 2014-06-06 NOTE — Progress Notes (Addendum)
INITIAL NUTRITION ASSESSMENT  DOCUMENTATION CODES Per approved criteria  -Non-severe (moderate) malnutrition in the context of chronic illness  Pt meets criteria for MODERATE MALNUTRITION in the context of CHRONIC ILLNESS as evidenced by moderate muscle wasting and moderate fat wasting.  INTERVENTION: Ensure Enlive po BID, each supplement provides 350 kcal and 20 grams of protein Provide snacks BID Encourage PO intake  NUTRITION DIAGNOSIS: Inadequate oral intake related to poor appetite as evidenced by moderate fat and muscle wasting.   Goal: Pt to meet >/= 90% of their estimated nutrition needs   Monitor:  PO intake, weight trend, labs, I/O's  Reason for Assessment: Consult  79 y.o. female  Admitting Dx: Acute diastolic congestive heart failure  ASSESSMENT: 79 y.o. female presenting with stigmata of heart failure. PMH is significant for HTN, HLD, AFib on coumadin, tricuspid insufficiency.   Pt states that she used to weigh 227 lbs 2 years ago but, has gradually been losing weight due to having a poor appetite. She reports eating 1-2 meals daily; mostly has an appetite for fruit. Pt states her family has been telling her she needs to eat more. She has tired Ensure and likes it. She denies any nausea, abdominal pain, or dysphagia. Per nursing notes, pt is eating 25% of most meals, 75-100% of some. RD encouraged PO intake with snacks in between meals; encouraged protein-rich foods at each meal.   Labs reviewed.   Nutrition Focused Physical Exam:  Subcutaneous Fat:  Orbital Region: mild wasting Upper Arm Region: moderate wasting Thoracic and Lumbar Region: moderate wasting  Muscle:  Temple Region: moderate wasting Clavicle Bone Region: moderate wasting Clavicle and Acromion Bone Region: moderate wasting Scapular Bone Region: mild wasting Dorsal Hand: moderate wasting Patellar Region: wnl Anterior Thigh Region: wnl Posterior Calf Region: mild wasting  Edema: none  noted    Height: Ht Readings from Last 1 Encounters:  06/04/14 5\' 5"  (1.651 m)    Weight: Wt Readings from Last 1 Encounters:  06/06/14 155 lb 14.4 oz (70.716 kg)    Ideal Body Weight: 125 lbs  % Ideal Body Weight: 124%  Wt Readings from Last 10 Encounters:  06/06/14 155 lb 14.4 oz (70.716 kg)  03/17/14 160 lb 11.2 oz (72.893 kg)  03/08/14 165 lb 8 oz (75.07 kg)  12/20/13 156 lb (70.761 kg)  09/23/13 163 lb (73.936 kg)  03/18/13 174 lb 6.4 oz (79.107 kg)  10/05/12 179 lb (81.194 kg)  09/10/11 179 lb (81.194 kg)  11/23/10 197 lb 11.2 oz (89.676 kg)  04/10/10 204 lb 1.6 oz (92.579 kg)    Usual Body Weight: 227 lbs (2 years ago per pt)  % Usual Body Weight: 68%  BMI:  Body mass index is 25.94 kg/(m^2).  Estimated Nutritional Needs: Kcal: 1700-1900 Protein: 80-90 grams Fluid: 1.7-1.9 L/day  Skin: intact  Diet Order: Diet Heart Room service appropriate?: Yes; Fluid consistency:: Thin  EDUCATION NEEDS: -No education needs identified at this time   Intake/Output Summary (Last 24 hours) at 06/06/14 1144 Last data filed at 06/06/14 1000  Gross per 24 hour  Intake    953 ml  Output   1400 ml  Net   -447 ml    Last BM: PTA  Labs:   Recent Labs Lab 06/04/14 1505 06/05/14 0314 06/06/14 0413  NA 136 136 137  K 3.1* 3.4* 3.9  CL 106 106 107  CO2 18* 18* 19  BUN 24* 24* 23  CREATININE 1.37* 1.32* 1.35*  CALCIUM 8.8 8.6 8.7  MG  --  1.6 1.8  GLUCOSE 127* 120* 118*    CBG (last 3)  No results for input(s): GLUCAP in the last 72 hours.  Scheduled Meds: . aspirin EC  81 mg Oral Daily  . carvedilol  3.125 mg Oral BID WC  . furosemide  40 mg Oral BID  . hydrALAZINE  25 mg Oral 3 times per day  . sodium chloride  3 mL Intravenous Q12H  . Warfarin - Pharmacist Dosing Inpatient   Does not apply q1800    Continuous Infusions:   Past Medical History  Diagnosis Date  . Atrial fibrillation   . Hyperlipidemia   . Hypertension     History reviewed.  No pertinent past surgical history.  Pryor Ochoa RD, LDN Inpatient Clinical Dietitian Pager: [phone] After Hours Pager: [phone]

## 2014-06-07 DIAGNOSIS — I5033 Acute on chronic diastolic (congestive) heart failure: Secondary | ICD-10-CM

## 2014-06-07 DIAGNOSIS — I110 Hypertensive heart disease with heart failure: Secondary | ICD-10-CM

## 2014-06-07 DIAGNOSIS — I071 Rheumatic tricuspid insufficiency: Secondary | ICD-10-CM

## 2014-06-07 DIAGNOSIS — I270 Primary pulmonary hypertension: Secondary | ICD-10-CM

## 2014-06-07 LAB — BASIC METABOLIC PANEL
ANION GAP: 11 (ref 5–15)
BUN: 29 mg/dL — ABNORMAL HIGH (ref 6–23)
CO2: 21 mmol/L (ref 19–32)
CREATININE: 1.45 mg/dL — AB (ref 0.50–1.10)
Calcium: 8.8 mg/dL (ref 8.4–10.5)
Chloride: 105 mmol/L (ref 96–112)
GFR calc Af Amer: 39 mL/min — ABNORMAL LOW (ref >90)
GFR, EST NON AFRICAN AMERICAN: 34 mL/min — AB (ref >90)
Glucose, Bld: 115 mg/dL — ABNORMAL HIGH (ref 70–99)
Potassium: 3.9 mmol/L (ref 3.5–5.1)
SODIUM: 137 mmol/L (ref 135–145)

## 2014-06-07 LAB — PROTIME-INR
INR: 2.25 — ABNORMAL HIGH (ref 0.00–1.49)
Prothrombin Time: 25 seconds — ABNORMAL HIGH (ref 11.6–15.2)

## 2014-06-07 LAB — BRAIN NATRIURETIC PEPTIDE: B Natriuretic Peptide: 1897.4 pg/mL — ABNORMAL HIGH (ref 0.0–100.0)

## 2014-06-07 MED ORDER — FUROSEMIDE 20 MG PO TABS
20.0000 mg | ORAL_TABLET | Freq: Once | ORAL | Status: AC
  Administered 2014-06-07: 20 mg via ORAL
  Filled 2014-06-07: qty 1

## 2014-06-07 MED ORDER — FUROSEMIDE 40 MG PO TABS
60.0000 mg | ORAL_TABLET | Freq: Two times a day (BID) | ORAL | Status: DC
  Filled 2014-06-07 (×2): qty 1

## 2014-06-07 MED ORDER — FUROSEMIDE 40 MG PO TABS
40.0000 mg | ORAL_TABLET | Freq: Two times a day (BID) | ORAL | Status: DC
  Administered 2014-06-07: 40 mg via ORAL
  Filled 2014-06-07 (×4): qty 1

## 2014-06-07 MED ORDER — WARFARIN SODIUM 5 MG PO TABS
5.0000 mg | ORAL_TABLET | Freq: Once | ORAL | Status: AC
  Administered 2014-06-07: 5 mg via ORAL
  Filled 2014-06-07: qty 1

## 2014-06-07 MED ORDER — HYDRALAZINE HCL 25 MG PO TABS
37.5000 mg | ORAL_TABLET | Freq: Three times a day (TID) | ORAL | Status: DC
  Administered 2014-06-07 – 2014-06-08 (×4): 37.5 mg via ORAL
  Filled 2014-06-07 (×6): qty 1.5

## 2014-06-07 MED ORDER — CARVEDILOL 6.25 MG PO TABS
6.2500 mg | ORAL_TABLET | Freq: Two times a day (BID) | ORAL | Status: DC
  Administered 2014-06-07 – 2014-06-08 (×2): 6.25 mg via ORAL
  Filled 2014-06-07 (×3): qty 1

## 2014-06-07 NOTE — Progress Notes (Signed)
Subjective:  Sitting up in bed, no SOB. She has not been OOB (not allowed up without assistance)  Objective:  Vital Signs in the last 24 hours: Temp:  [98 F (36.7 C)-98.7 F (37.1 C)] 98.5 F (36.9 C) (04/19 0545) Pulse Rate:  [67-84] 78 (04/19 0545) Resp:  [18-29] 20 (04/19 0545) BP: (133-174)/(65-86) 174/86 mmHg (04/19 0545) SpO2:  [97 %-99 %] 97 % (04/19 0545) Weight:  [156 lb 4.9 oz (70.9 kg)] 156 lb 4.9 oz (70.9 kg) (04/19 0545)  Intake/Output from previous day:  Intake/Output Summary (Last 24 hours) at 06/07/14 0902 Last data filed at 06/07/14 0609  Gross per 24 hour  Intake    306 ml  Output   1100 ml  Net   -794 ml   . aspirin EC  81 mg Oral Daily  . carvedilol  6.25 mg Oral BID WC  . feeding supplement (ENSURE ENLIVE)  237 mL Oral BID BM  . furosemide  40 mg Oral BID  . hydrALAZINE  25 mg Oral 3 times per day  . sodium chloride  3 mL Intravenous Q12H  . warfarin  5 mg Oral ONCE-1800  . Warfarin - Pharmacist Dosing Inpatient   Does not apply q1800     Physical Exam: General appearance: alert, cooperative, no distress and thin, frail Neck: JVD Lungs: few basilar crackles on Rt base Heart: irregularly irregular rhythm and 3/6 systolic murmur LSB, marked LV pulsation Extremities: trace edema   Rate: 76  Rhythm: atrial fibrillation  Lab Results:  Recent Labs  06/04/14 1505 06/05/14 0314  WBC 7.4 6.5  HGB 10.7* 10.8*  PLT 121* 122*    Recent Labs  06/06/14 0413 06/07/14 0452  NA 137 137  K 3.9 3.9  CL 107 105  CO2 19 21  GLUCOSE 118* 115*  BUN 23 29*  CREATININE 1.35* 1.45*    Recent Labs  06/05/14 0314 06/05/14 0948  TROPONINI 0.06* 0.04*    Recent Labs  06/07/14 0452  INR 2.25*    Imaging: Imaging results have been reviewed  Cardiac Studies: Echo 06/06/14 Study Conclusions  - Left ventricle: The cavity size was normal. Wall thickness was increased in a pattern of moderate to severe LVH. The estimated ejection  fraction was 60%. Wall motion was normal; there were no regional wall motion abnormalities. - Aortic valve: Sclerosis without stenosis. There was mild regurgitation. - Mitral valve: Mildly thickened leaflets . There was moderate regurgitation. - Left atrium: The atrium was severely dilated. - Right ventricle: The cavity size was mildly dilated. Systolic function was mildly to moderately reduced. - Right atrium: The atrium was severely dilated. - Tricuspid valve: There was severe regurgitation. - Pulmonary arteries: PA peak pressure: 75 mm Hg (S). - Pericardium, extracardiac: A small pericardial effusion was identified posterior to the heart.    Assessment/Plan:  79 y.o.female with known history of permanent atrial fib, followed in our coumadin clinic CHADS VASC Score of 3. She has severe tricuspid regurgitation, pulmonary hypertension, HCVD, and hyperlipidemia. She was admitted 06/04/14 with acute on chronic diastolic CHF. Adm wgt 162.   Principal Problem:   Acute on chronic diastolic congestive heart failure Active Problems:   Hypertensive urgency   Tricuspid regurgitation-severe   Permanent atrial fibrillation   Hypertensive cardiovascular disease- LVH   Pulmonary hypertension-severe   Chronic renal disease, stage III   Chronic anticoagulation   Dyslipidemia   Dyspnea   Falls   Malnutrition of moderate degree   PLAN:  I/O negative 4.8 L. She is improved symptomatically.    B/P still high. Consider increasing hydralazine- not on Amlodipine secondary to edema, not on ACE secondary to CRI, not sure she could tolerate higher dose of Coreg (HR 75). Unfortunately compliance with a TID medication is marginal.   Kerin Ransom PA-C [place] [id number] 06/07/2014, 9:02 AM    Patient seen and examined. Agree with assessment and plan. I/O -[id number]. Cr 1.45 today. Loud honking 3/6 systolic murmur. Nl LV fxn with moderately severe LVH, Mild AR, moderate MR, severe TR and PA pp 75  mmHg.  Stage 2 HTN. Will increase hydralazine to 37.5 mg tid today.  Coreg started yesterday, not yet at steady state.   Troy Sine, MD, [facility] 06/07/2014 10:18 AM

## 2014-06-07 NOTE — Progress Notes (Signed)
Physical Therapy Treatment Patient Details Name: Jennifer Hinton MRN: [id number] DOB: Dec 09, 1935 Today's Date: 06/07/2014    History of Present Illness Patient is a 79 yo female admitted 06/04/14 with LE swelling.  Patient with CHF exacerbation and HTN.  PMH:  CHF, HTN, Afib, tricuspid insufficiency, h/o falls    PT Comments    Pt very pleasant and increased gait today with cues for hand placement. Pt encouraged to continue mobility, HEP and activity throughout the day with nursing staff. Will continue to follow. No SOB, HR 93   Follow Up Recommendations  Home health PT;Supervision for mobility/OOB     Equipment Recommendations       Recommendations for Other Services       Precautions / Restrictions Precautions Precautions: Fall Restrictions Weight Bearing Restrictions: No    Mobility  Bed Mobility Overal bed mobility: Needs Assistance Bed Mobility: Supine to Sit     Supine to sit: Supervision     General bed mobility comments: in chair on arrival  Transfers Overall transfer level: Needs assistance Equipment used: Rolling walker (2 wheeled) Transfers: Sit to/from Stand Sit to Stand: Min guard Stand pivot transfers: Min guard       General transfer comment: cues for hand placement, sequence, safety and RW position with transfers. Chair to [facility] pt incontinent of urine with assist for pericare   Ambulation/Gait Ambulation/Gait assistance: Min guard Ambulation Distance (Feet): 250 Feet Assistive device: Rolling walker (2 wheeled) Gait Pattern/deviations: Step-through pattern;Trunk flexed;Decreased stride length   Gait velocity interpretation: Below normal speed for age/gender General Gait Details: cues for posture, position in RW and safety   Stairs            Wheelchair Mobility    Modified Rankin (Stroke Patients Only)       Balance Overall balance assessment: Needs assistance   Sitting balance-Leahy Scale: Good       Standing balance-Leahy  Scale: Fair                      Cognition Arousal/Alertness: Awake/alert Behavior During Therapy: WFL for tasks assessed/performed Overall Cognitive Status: Within Functional Limits for tasks assessed                      Exercises General Exercises - Lower Extremity Long Arc Quad: Strengthening;Both;Seated;AROM;15 reps Hip ABduction/ADduction: AROM;Seated;Both;15 reps Hip Flexion/Marching: AROM;Seated;Both;20 reps Toe Raises: AROM;Seated;Both;20 reps Heel Raises: AROM;Seated;Both;20 reps    General Comments        Pertinent Vitals/Pain Pain Assessment: No/denies pain    Home Living                      Prior Function            PT Goals (current goals can now be found in the care plan section) Progress towards PT goals: Progressing toward goals    Frequency       PT Plan Current plan remains appropriate    Co-evaluation             End of Session Equipment Utilized During Treatment: Gait belt Activity Tolerance: Patient tolerated treatment well Patient left: in chair;with chair alarm set;with family/visitor present;with call bell/phone within reach     Time: 1130-1153 PT Time Calculation (min) (ACUTE ONLY): 23 min  Charges:  $Gait Training: 8-22 mins $Therapeutic Exercise: 8-22 mins                    G  Codes:      Melford Aase 2014-06-19, 12:08 PM Elwyn Reach, [place]

## 2014-06-07 NOTE — Progress Notes (Signed)
Occupational Therapy Treatment Patient Details Name: Jennifer Hinton MRN: [id number] DOB: 1936/01/03 Today's Date: 06/07/2014    History of present illness Patient is a 79 yo female admitted 06/04/14 with LE swelling.  Patient with CHF exacerbation and HTN.  PMH:  CHF, HTN, Afib, tricuspid insufficiency, h/o falls   OT comments  Pt up to the sink with walker to perform grooming and also transferred to [facility] for voiding. Pt needs frequent verbal cues for safety with walker as she tends to push the walker too far in front of her and turns too far with walker before turning her feet. BP sitting on BSC 171/70. HR 80. Will follow. Explained and educated on 3in1 use at [facility] at night and for over the toilet during the day.    Follow Up Recommendations  Home health OT;Supervision/Assistance - 24 hour    Equipment Recommendations  3 in 1 bedside comode    Recommendations for Other Services      Precautions / Restrictions Precautions Precautions: Fall Restrictions Weight Bearing Restrictions: No       Mobility Bed Mobility Overal bed mobility: Needs Assistance Bed Mobility: Supine to Sit     Supine to sit: Supervision        Transfers Overall transfer level: Needs assistance Equipment used: Rolling walker (2 wheeled) Transfers: Sit to/from Stand Sit to Stand: Min guard Stand pivot transfers: Min assist       General transfer comment: min assist to pivot to [facility] without device. Min guard assist and verbal cues for sit to stand.     Balance                                   ADL       Grooming: Wash/dry hands;Min guard;Standing               Lower Body Dressing: Set up;Sitting/lateral leans   Toilet Transfer: Min guard;Ambulation;BSC;RW;Minimal assistance   Toileting- Clothing Manipulation and Hygiene: Min guard         General ADL Comments: Pt up to [facility] across from bed with min assist without device and min assist to steady as she pivoted around  with very forward flexed posture. With use of walker to transfer around EOB from [facility] to the sink and  back around the bed to the recliner, pt was min guard assist and mod verbal cues for safety with walker use. She tends to push the walker too far in front of her. SHe also turns the walker too far to change direction without stepping around with her feet. Pt donned own slippers but they do not fit her well and her feet started to slip out. Educated pt on safe foot wear and recommended pt wear gripper socks. Donned gripper socks for pt when OT returned to room from obtaining socks as pt on the phone.       Vision                     Perception     Praxis      Cognition   Behavior During Therapy: [facility] for tasks assessed/performed Overall Cognitive Status: Within Functional Limits for tasks assessed                       Extremity/Trunk Assessment               Exercises  Shoulder Instructions       General Comments      Pertinent Vitals/ Pain       Pain Assessment: No/denies pain  Home Living                                          Prior Functioning/Environment              Frequency Min 2X/week     Progress Toward Goals  OT Goals(current goals can now be found in the care plan section)  Progress towards OT goals: Progressing toward goals     Plan Discharge plan remains appropriate    Co-evaluation                 End of Session Equipment Utilized During Treatment: Rolling walker   Activity Tolerance Patient tolerated treatment well   Patient Left in chair;with call bell/phone within reach;with chair alarm set   Nurse Communication          Time: [id number] OT Time Calculation (min): 23 min  Charges: OT General Charges $OT Visit: 1 Procedure OT Treatments $Self Care/Home Management : 8-22 mins $Therapeutic Activity: 8-22 mins  Jules Schick  [id number] 06/07/2014, 10:55 AM

## 2014-06-07 NOTE — Progress Notes (Signed)
Family Medicine Teaching Service Daily Progress Note Intern Pager: [phone]  Patient name: Jennifer Hinton Medical record number: [id number] Date of birth: Dec 12, 1935 Age: 79 y.o. Gender: female  Primary Care Provider: Tommi Rumps, MD Consultants: cardiology Code Status: FULL  Pt Overview and Major Events to Date:  4/16 - admitted with increased LE swelling, dyspnea on exertion of gradual onset; acute hypertensive urgency - bedside echo in ED by Dr. Evelina Bucy with decreased contractility, dilated IVC, no pericardial effusion / tamponade 4/17 - mild troponin elevation to 0.06 (?demand), cardiology consulted  Assessment and Plan: 79 y.o. female presenting with stigmata of heart failure (leg swelling, dyspnea with exertion). PMH is significant for HTN, HLD, AFib on coumadin, tricuspid insufficiency.   # Acute CHF - echo with normal EF in Jan 2016 but showed LVH, severe biatrial enlargement, and severe TR, with PA HTN - Pro-BNP 4000 > 1897 - Troponin 0.06 >0.04 - Bedside Echo in ED: decreased contractility, dilated IVC, no pericardial effusion/tamponade - Echocardiogram- EF 60%, moderate to severe LVH, severely dilated atria - CXR: enlargement of cardiopericardial silhouette, concerning for pericardial effusion (ruled out per bedside echo described above), no pneumonia or pulmonary edema - Cardiology consult requested 4/17; appreciate assistance - Continue Lasix 40mg  PO BID - Daily weights, strict I/O  - 162 pounds at admission >> 155pounds >> 156 pounds  - 160 pounds at office visit in January 2015  - Down 4.841L since admission, down 0.643 in last 24hr  # Hypertensive urgency: in the ED, 180's / 110's and higher (given Lopressor), improved to 160's / 80's 4/17 - 174/86 this am - Continue Coreg 3.25mg  BID, Hydralazine 25mg  q8 hr--appears to have increased BP in am, will change pm dose of Coreg to 10pm - Continue hydralazine 20 mg q2 PRN - Cardiology consulted, appreciate  recommendations. --Increasing dose of Hydralazine to 37.5mg  q8hr - Holding Norvasc--consider discontinuing at discharge  # CKD stage III: likely mild pre-renal acute kidney injury on chronic CKD with Cr ~1.37 (baseline ~1), in setting of CHF - Creatinine 1.35 >> 1.45 - Managing BP as above and treating for CHF - Need to balance diuresis with kidney function - Expect kidney perfusion should actually improve with better control of CHF  # Atrial fibrillation: Rate controlled in ED; pt vague about med compliance but INR therapeutic  - Continue Coumadin per pharmacy - Continue Lopressor 200mg  po BID for rate control.   Mild normocytic anemia: mild, Hb stable ~10.8 on 4/17 - Hemoglobin 10.8 - Monitor labs as needed - No colonoscopy found in EMR; likely needs outpt GI f/u  FEN/GI: Heart healthy diet; Saline lock IV Prophylaxis: Full dose coumadin  Disposition: Possible discharge in am  Subjective:  Feels well today. Denies shortness of breath. Edema improved. No further concerns. Anxious for discharge.  Objective: Temp:  [98 F (36.7 C)-98.7 F (37.1 C)] 98.5 F (36.9 C) (04/19 0545) Pulse Rate:  [67-84] 78 (04/19 0545) Resp:  [18-29] 20 (04/19 0545) BP: (133-174)/(65-86) 174/86 mmHg (04/19 0545) SpO2:  [97 %-99 %] 97 % (04/19 0545) Weight:  [156 lb 4.9 oz (70.9 kg)] 156 lb 4.9 oz (70.9 kg) (04/19 0545) Physical Exam: General: 79yo female resting comfortably in no apparent distress Cardio: Irregularly irregular, IV/VI harsh systolic murmur with thrill throughout precordium Pulm: CTAB, unlabored breathing on RA Abdomen: soft, nontender, BS+ Extremities: global muscular wasting, 1+ bilateral LE pitting edema Skin: no rashes or wounds Neuro: Alert and oriented. Gait not assessed.   Laboratory:  Recent Labs Lab  06/04/14 1505 06/05/14 0314  WBC 7.4 6.5  HGB 10.7* 10.8*  HCT 32.8* 32.1*  PLT 121* 122*    Recent Labs Lab 06/04/14 1505 06/05/14 0314 06/06/14 0413  06/07/14 0452  NA 136 136 137 137  K 3.1* 3.4* 3.9 3.9  CL 106 106 107 105  CO2 18* 18* 19 21  BUN 24* 24* 23 29*  CREATININE 1.37* 1.32* 1.35* 1.45*  CALCIUM 8.8 8.6 8.7 8.8  PROT 6.9  --   --   --   BILITOT 1.7*  --   --   --   ALKPHOS 47  --   --   --   ALT 13  --   --   --   AST 27  --   --   --   GLUCOSE 127* 120* 118* 115*    Recent Labs Lab 06/04/14 2154 06/05/14 0314 06/06/14 0413 06/07/14 0452  INR 3.50* 3.01* 2.56* 2.25*    Recent Labs Lab 06/04/14 2154 06/05/14 0314 06/05/14 0948  TROPONINI 0.06* 0.06* 0.04*   BNP 4/16 @1505 : 4042  Imaging/Diagnostic Tests: ED EKG: afib, PVC's, inferior T-waves flattened ED bedside echocardiogram: CXR (port) 4/16 @1514 : significant cardiomegaly, no consolidation or pulmonary edema Echo: EF 60%, moderate to severe LVH, severely dilated atria, PA [place], DO 06/07/2014, 8:18 AM PGY-1, [place] Intern pager: [phone], text pages welcome

## 2014-06-07 NOTE — Progress Notes (Signed)
[facility] for War[facility]in  Indication: atrial fibrillation  No Known Allergies  Patient Measurements: Height: 5\' 5"  (165.1 cm) Weight: 156 lb 4.9 oz (70.9 kg) IBW/kg (Calculated) : 57  Labs:  Recent Labs  06/04/14 1505  06/04/14 2154 06/05/14 0314 06/05/14 0948 06/06/14 0413 06/07/14 0452  HGB 10.7*  --   --  10.8*  --   --   --   HCT 32.8*  --   --  32.1*  --   --   --   PLT 121*  --   --  122*  --   --   --   LABPROT  --   < > 35.4* 31.5*  --  27.7* 25.0*  INR  --   < > 3.50* 3.01*  --  2.56* 2.25*  CREATININE 1.37*  --   --  1.32*  --  1.35* 1.45*  TROPONINI  --   --  0.06* 0.06* 0.04*  --   --   < > = values in this interval not displayed. Medical History: Past Medical History  Diagnosis Date  . Atrial fibrillation   . Hyperlipidemia   . Hypertension    Assessment: [name] on warfarin for afib (CHADSVASc 4), INR is supra-therapeutic on admission at 3.5 and warfarin held. INR now 2.25 . H/H and plt low but stable with no reported s/s of significant bleeding. Dose was not ordered/given on 4/18  Home dose: 5 mg daily except 2.5 mg on Tuesday, last dose on 4/15 (last clinic visit INR was 2.8- 3/16)   Goal of Therapy:  INR 2-3 Monitor platelets by anticoagulation protocol: Yes   Plan:  - Coumadin 5 mg PO x 1 tonight  - Daily PT/INR - Monitor for bleeding  Thanks for allowing pharmacy to be a part of this patient's care.  Excell Seltzer, PharmD Clinical Pharmacist, [phone] 06/07/2014 8:32 AM

## 2014-06-08 ENCOUNTER — Encounter (HOSPITAL_COMMUNITY): Payer: Self-pay | Admitting: General Practice

## 2014-06-08 DIAGNOSIS — Z7901 Long term (current) use of anticoagulants: Secondary | ICD-10-CM

## 2014-06-08 LAB — BASIC METABOLIC PANEL
ANION GAP: 11 (ref 5–15)
BUN: 29 mg/dL — ABNORMAL HIGH (ref 6–23)
CALCIUM: 9.1 mg/dL (ref 8.4–10.5)
CO2: 23 mmol/L (ref 19–32)
Chloride: 101 mmol/L (ref 96–112)
Creatinine, Ser: 1.3 mg/dL — ABNORMAL HIGH (ref 0.50–1.10)
GFR calc Af Amer: 45 mL/min — ABNORMAL LOW (ref >90)
GFR calc non Af Amer: 39 mL/min — ABNORMAL LOW (ref >90)
Glucose, Bld: 145 mg/dL — ABNORMAL HIGH (ref 70–99)
POTASSIUM: 3.3 mmol/L — AB (ref 3.5–5.1)
SODIUM: 135 mmol/L (ref 135–145)

## 2014-06-08 LAB — PROTIME-INR
INR: 1.74 — AB (ref 0.00–1.49)
PROTHROMBIN TIME: 20.5 s — AB (ref 11.6–15.2)

## 2014-06-08 MED ORDER — HYDRALAZINE HCL 25 MG PO TABS: 37.5000 mg | ORAL_TABLET | Freq: Three times a day (TID) | ORAL | Status: DC

## 2014-06-08 MED ORDER — FUROSEMIDE 40 MG PO TABS: 40.0000 mg | ORAL_TABLET | Freq: Every day | ORAL | Status: DC

## 2014-06-08 MED ORDER — POTASSIUM CHLORIDE CRYS ER 20 MEQ PO TBCR
40.0000 meq | EXTENDED_RELEASE_TABLET | Freq: Once | ORAL | Status: AC
  Administered 2014-06-08: 40 meq via ORAL
  Filled 2014-06-08: qty 2

## 2014-06-08 MED ORDER — CARVEDILOL 12.5 MG PO TABS: 12.5000 mg | ORAL_TABLET | Freq: Two times a day (BID) | ORAL | Status: DC

## 2014-06-08 MED ORDER — WARFARIN SODIUM 5 MG PO TABS
5.0000 mg | ORAL_TABLET | Freq: Once | ORAL | Status: AC
  Administered 2014-06-08: 5 mg via ORAL
  Filled 2014-06-08: qty 1

## 2014-06-08 MED ORDER — CARVEDILOL 12.5 MG PO TABS
12.5000 mg | ORAL_TABLET | Freq: Two times a day (BID) | ORAL | Status: DC
  Filled 2014-06-08: qty 1

## 2014-06-08 NOTE — Progress Notes (Signed)
Family Medicine Teaching Service Daily Progress Note Intern Pager: [phone]  Patient name: Jennifer Hinton Medical record number: [id number] Date of birth: [birth year] Age: 79 y.o. Gender: female  Primary Care Provider: Tommi Rumps, MD Consultants: cardiology Code Status: FULL  Pt Overview and Major Events to Date:  4/16 - admitted with increased LE swelling, dyspnea on exertion of gradual onset; acute hypertensive urgency - bedside echo in ED by Dr. Evelina Bucy with decreased contractility, dilated IVC, no pericardial effusion / tamponade 4/17 - mild troponin elevation to 0.06 (?demand), cardiology consulted  Assessment and Plan: 79 y.o. female presenting with stigmata of heart failure (leg swelling, dyspnea with exertion). PMH is significant for HTN, HLD, AFib on coumadin, tricuspid insufficiency.   # Acute CHF - echo with normal EF in Jan 2016 but showed LVH, severe biatrial enlargement, and severe TR, with PA HTN - Pro-BNP 4000 > 1897 - Troponin 0.06 >0.04 - Bedside Echo in ED: decreased contractility, dilated IVC, no pericardial effusion/tamponade - Echocardiogram- EF 60%, moderate to severe LVH, severely dilated atria - CXR: enlargement of cardiopericardial silhouette, concerning for pericardial effusion (ruled out per bedside echo described above), no pneumonia or pulmonary edema - Cardiology consult requested 4/17; appreciate assistance - Continue Lasix 40mg  PO BID--decrease to daily dosing - Daily weights, strict I/O  - 162 pounds at admission >> 152 pounds  - 160 pounds at office visit in January 2015  - Down 6.854L since admission, down 2.776 in last 24hr  # Hypertensive urgency: in the ED, 180's / 110's and higher (given Lopressor), improved to 160's / 80's 4/17 - 157/79 this am, up to 209/84 yesterday afternoon - Continue Coreg 3.25mg  BID, Hydralazine 37.5mg  q8 hr--Increase dose of Coreg to 6.25mg  BID - Continue hydralazine 20 mg q2 PRN - Cardiology consulted,  appreciate recommendations - Holding Norvasc--consider discontinuing at discharge  # CKD stage III: likely mild pre-renal acute kidney injury on chronic CKD with Cr ~1.37 (baseline ~1), in setting of CHF - Creatinine 1.35 >> 1.45 - Managing BP as above and treating for CHF - Need to balance diuresis with kidney function - Expect kidney perfusion should actually improve with better control of CHF  # Atrial fibrillation: Rate controlled in ED; pt vague about med compliance but INR therapeutic  - INR 1.74 - Continue Coumadin per pharmacy - Continue Lopressor 200mg  po BID for rate control.   FEN/GI: Heart healthy diet; Saline lock IV Prophylaxis: Full dose coumadin  Disposition: Possible discharge in am  Subjective:  Feeling well today. States she ambulated yesterday and did not note any shortness of breath. Anxious for discharge.  Objective: Temp:  [97.3 F (36.3 C)-98.4 F (36.9 C)] 98.2 F (36.8 C) (04/20 0604) Pulse Rate:  [68-93] 89 (04/20 0604) Resp:  [18] 18 (04/20 0604) BP: (157-209)/(67-93) 157/79 mmHg (04/20 0604) SpO2:  [97 %-100 %] 100 % (04/20 0604) Weight:  [152 lb (68.947 kg)] 152 lb (68.947 kg) (04/20 0604) Physical Exam: General: 78yo female resting comfortably in no apparent distress Cardio: Irregularly irregular, IV/VI harsh systolic murmur with thrill throughout precordium Pulm: CTAB, unlabored breathing on RA Abdomen: soft, nontender, BS+ Extremities: global muscular wasting, 1+ bilateral LE pitting edema Skin: no rashes or wounds Neuro: Alert and oriented.   Laboratory:  Recent Labs Lab 06/04/14 1505 06/05/14 0314  WBC 7.4 6.5  HGB 10.7* 10.8*  HCT 32.8* 32.1*  PLT 121* 122*    Recent Labs Lab 06/04/14 1505 06/05/14 0314 06/06/14 0413 06/07/14 0452  NA 136 136 137  137  K 3.1* 3.4* 3.9 3.9  CL 106 106 107 105  CO2 18* 18* 19 21  BUN 24* 24* 23 29*  CREATININE 1.37* 1.32* 1.35* 1.45*  CALCIUM 8.8 8.6 8.7 8.8  PROT 6.9  --   --   --    BILITOT 1.7*  --   --   --   ALKPHOS 47  --   --   --   ALT 13  --   --   --   AST 27  --   --   --   GLUCOSE 127* 120* 118* 115*    Recent Labs Lab 06/04/14 2154 06/05/14 0314 06/06/14 0413 06/07/14 0452 06/08/14 0406  INR 3.50* 3.01* 2.56* 2.25* 1.74*    Recent Labs Lab 06/04/14 2154 06/05/14 0314 06/05/14 0948  TROPONINI 0.06* 0.06* 0.04*   BNP 4/16 @1505 : 4042  Imaging/Diagnostic Tests: ED EKG: afib, PVC's, inferior T-waves flattened ED bedside echocardiogram: CXR (port) 4/16 @1514 : significant cardiomegaly, no consolidation or pulmonary edema Echo: EF 60%, moderate to severe LVH, severely dilated atria, PA [place], DO 06/08/2014, 8:01 AM PGY-1, [facility] Intern pager: [phone], text pages welcome

## 2014-06-08 NOTE — Progress Notes (Signed)
[facility] for Warfarin  Indication: atrial fibrillation  No Known Allergies  Patient Measurements: Height: 5\' 5"  (165.1 cm) Weight: 152 lb (68.947 kg) IBW/kg (Calculated) : 57  Labs:  Recent Labs  06/06/14 0413 06/07/14 0452 06/08/14 0406  LABPROT 27.7* 25.0* 20.5*  INR 2.56* 2.25* 1.74*  CREATININE 1.35* 1.45*  --    Medical History: Past Medical History  Diagnosis Date  . Atrial fibrillation   . Hyperlipidemia   . Hypertension    Assessment: [name] on warfarin for afib (CHADSVASc 4), INR is supra-therapeutic on admission at 3.5 and warfarin held. INR now 1.74 as dose was missed on 4/18 . H/H and plt low but stable with no reported s/s of significant bleeding.  Home dose: 5 mg daily except 2.5 mg on Tuesday, last dose on 4/15 (last clinic visit INR was 2.8- 3/16)   Goal of Therapy:  INR 2-3 Monitor platelets by anticoagulation protocol: Yes   Plan:  - Coumadin 5 mg PO x 1 tonight  - Daily PT/INR - Monitor for bleeding  Thanks for allowing pharmacy to be a part of this patient's care.  Excell Seltzer, PharmD Clinical Pharmacist, [phone] 06/08/2014 10:06 AM

## 2014-06-08 NOTE — Progress Notes (Signed)
Patient HR goes up to 130s-140s while up using BSC.  Goes back down to 90s-low 100s when at rest.

## 2014-06-08 NOTE — Progress Notes (Signed)
Subjective:  She says she feesl much better, asking about going home  Objective:  Vital Signs in the last 24 hours: Temp:  [97.3 F (36.3 C)-98.4 F (36.9 C)] 98.2 F (36.8 C) (04/20 0604) Pulse Rate:  [68-93] 89 (04/20 0604) Resp:  [18] 18 (04/20 0604) BP: (157-209)/(67-93) 157/79 mmHg (04/20 0604) SpO2:  [97 %-100 %] 100 % (04/20 0604) Weight:  [152 lb (68.947 kg)] 152 lb (68.947 kg) (04/20 0604)  Intake/Output from previous day:  Intake/Output Summary (Last 24 hours) at 06/08/14 0903 Last data filed at 06/08/14 [id number]  Gross per 24 hour  Intake   1190 ml  Output   3176 ml  Net  -1986 ml   . aspirin EC  81 mg Oral Daily  . carvedilol  6.25 mg Oral BID  . feeding supplement (ENSURE ENLIVE)  237 mL Oral BID BM  . furosemide  40 mg Oral BID  . hydrALAZINE  37.5 mg Oral 3 times per day  . sodium chloride  3 mL Intravenous Q12H  . Warfarin - Pharmacist Dosing Inpatient   Does not apply q1800     Physical Exam: General appearance: alert, cooperative, no distress and thin, frail Neck: JVD Lungs: few basilar crackles on Rt base Heart: irregularly irregular rhythm and 3/6 systolic murmur LSB, marked LV pulsation Extremities: trace edema   Rate: 76  Rhythm: atrial fibrillation PVCs  Lab Results: No results for input(s): WBC, HGB, PLT in the last 72 hours.  Recent Labs  06/06/14 0413 06/07/14 0452  NA 137 137  K 3.9 3.9  CL 107 105  CO2 19 21  GLUCOSE 118* 115*  BUN 23 29*  CREATININE 1.35* 1.45*    Recent Labs  06/05/14 0948  TROPONINI 0.04*    Recent Labs  06/08/14 0406  INR 1.74*   Meds . aspirin EC  81 mg Oral Daily  . carvedilol  6.25 mg Oral BID  . feeding supplement (ENSURE ENLIVE)  237 mL Oral BID BM  . furosemide  40 mg Oral BID  . hydrALAZINE  37.5 mg Oral 3 times per day  . sodium chloride  3 mL Intravenous Q12H  . Warfarin - Pharmacist Dosing Inpatient   Does not apply q1800      Imaging: Imaging results have been  reviewed  Cardiac Studies: Echo 06/06/14 Study Conclusions  - Left ventricle: The cavity size was normal. Wall thickness was increased in a pattern of moderate to severe LVH. The estimated ejection fraction was 60%. Wall motion was normal; there were no regional wall motion abnormalities. - Aortic valve: Sclerosis without stenosis. There was mild regurgitation. - Mitral valve: Mildly thickened leaflets . There was moderate regurgitation. - Left atrium: The atrium was severely dilated. - Right ventricle: The cavity size was mildly dilated. Systolic function was mildly to moderately reduced. - Right atrium: The atrium was severely dilated. - Tricuspid valve: There was severe regurgitation. - Pulmonary arteries: PA peak pressure: 75 mm Hg (S). - Pericardium, extracardiac: A small pericardial effusion was identified posterior to the heart.    Assessment/Plan:  79 y.o.female with known history of permanent atrial fib, followed in our coumadin clinic CHADS VASC Score of 3. She has severe tricuspid regurgitation, pulmonary hypertension, HCVD, and hyperlipidemia. She was admitted 06/04/14 with acute on chronic diastolic CHF. Adm wgt 162.   Principal Problem:   Acute on chronic diastolic congestive heart failure Active Problems:   Hypertensive urgency   Tricuspid regurgitation-severe   Permanent atrial fibrillation  Hypertensive cardiovascular disease- LVH   Pulmonary hypertension-severe   Chronic renal disease, stage III   Chronic anticoagulation   Dyslipidemia   Dyspnea   Falls   Malnutrition of moderate degree   PLAN:   I/O negative 6.8 L. She is improved symptomatically. Slight bump in SCr- 1.45, consider decreasing Lasix to 40 mg daily.  B/P still high.  Hydralazine increased yesterday- not on Amlodipine secondary to edema, not on ACE secondary to CRI. Consider increasing Coreg- ? OK for discharge with plans for early TCM f/u - Dr Claiborne Billings to see this am.   Kerin Ransom [facility] [id number] 06/08/2014, 9:03 AM    Patient seen and examined. Agree with assessment and plan.Coninues to have excellent diuresis I/O - 6854 since admission. Feels much better. Murmur is less honking today. Will increase coreg to 12.5 mg bid for improved rate and BP control. Prob can dc later today or tomorrow.  Troy Sine, MD, [facility] 06/08/2014 11:13 AM

## 2014-06-08 NOTE — Discharge Instructions (Signed)

## 2014-06-08 NOTE — Progress Notes (Signed)
DC IV, DC Tele, DC Home. Discharge instructions and home medications discussed with patient and patient's family members. Patient and patient's family members denied any questions or concerns at this time. Patient leaving unit via wheelchair and appears in no acute distress.

## 2014-06-10 ENCOUNTER — Telehealth: Payer: Self-pay | Admitting: Family Medicine

## 2014-06-10 NOTE — Telephone Encounter (Signed)
Will forward to Dr. Gerlean Ren who discharged patient. Atreus Hasz,CMA

## 2014-06-10 NOTE — Telephone Encounter (Signed)
Was suppose to get a rolator and bedside commode when discharged from the hospital [facility] says they never received the orders Please advise

## 2014-06-10 NOTE — Discharge Summary (Signed)
[facility] Discharge Summary  Patient name: Jennifer Hinton Medical record number: [id number] Date of birth: 03/20/1935 Age: 79 y.o. Gender: female Date of Admission: 06/04/2014  Date of Discharge: 06/08/14 Admitting Physician: Dickie La, MD  Primary Care Provider: Tommi Rumps, MD Consultants: Cardiology  Indication for Hospitalization: New Onset HF  Discharge Diagnoses/Problem List:  HFpEF Hypertensive Urgency Atrial Fibrillation Hypokalemia  Disposition: Stable  Discharge Condition: Discharge Home  Discharge Exam: Please refer to Progress Note from 06/08/14  Brief Hospital Course:  Jennifer Hinton is a 79yo female who presented to the Emergency Department on 4/16 with increasing leg swellling and shortness of breath with exertion and no previous history of HF. Blood pressure elevated to 180s/110s, improved with Lopressor. Pro-BNP elevated to 4042. EKG with T wave flattening in II and aVF. Troponins 0.06, thought to be mildly elevated due to demand. CXR showed significant pulmonary edema and cardiomegaly, raising concerns for pericardial effusion. Bedside Echocardiogram in ED showed decreased contractility and dilated IVC without pericardial effusion or tamponade. History of previous Echocardiogram in January 2016 that showed normal EF, LVH, severe bi-atrial enlargement, severe tricuspid regurgitation, and pulmonary HTN.  Admitted to [facility] Medicine Teaching Service due to CHF exacerbation.  Initial workup showed potassium of 3.4, which was repleted orally. Diuresis initiated with Lasix 40mg  IV BID. Cardiology consulted due to concern for new HF and possibly worsening tricuspid insufficiency and recommendations followed throughout hospitalization. Transitioned to oral Lasix 40mg  BID on 4/18 after total output of 4L noted and loss of 7pounds. Pro-BNP was rechecked and showed significant improvement to 1897. Troponin improved to 0.04 with improved fluid status.  Echocardiogram on 4/18 showed EF 60%, moderate to severe LVH, severely dilated atria, and PA peak pressure of 75. Jennifer Hinton continued to diurese well and on 4/20, diuretic was decreased to Lasix 40mg  daily.   BP noted to be elevated throughout hospitalization. Home Lopressor 200mg  BID and Cardura 4mg  qHD restarted on 4/17. Hydralazine 25mg  q8hr also initiated. On 4/18, Cardura was discontinued and Lopressor was transitioned to Coreg 3.25mg  BID. BP continued to be uncontrolled and suspected to be significantly related to HF, so Hydralazine dose increased to 37.5mg  q8hr and Coreg increased to 6.25mg  BID on 4/19.  Coreg was also titrated up to 12.5mg  BID prior to discharge.  Also of note, creatinine was mildly elevated to 1.3 with baseline of ~1 at admission, expected to improve with better control of HF. Stable throughout hospitalization.  Jennifer Hinton was discharged on 4/20 with resolution of shortness of breath with ambulation and improved edema.  Issues for Follow Up:  1. Monitor BP. Discharged with Carvedilol 12.5mg  BID and Hydralazine 37.5mg  q8hr.  2. Monitor fluid status. Discharged with Lasix 40mg  daily.  3. Check BMP. Potassium 3.3 at discharge. Discharged with KDur 22mEq daily. 4. Discharged with Home Health services.   Significant Procedures: None  Significant Labs and Imaging:   Recent Labs Lab 06/04/14 1505 06/05/14 0314  WBC 7.4 6.5  HGB 10.7* 10.8*  HCT 32.8* 32.1*  PLT 121* 122*    Recent Labs Lab 06/04/14 1505 06/05/14 0314 06/06/14 0413 06/07/14 0452 06/08/14 0907  NA 136 136 137 137 135  K 3.1* 3.4* 3.9 3.9 3.3*  CL 106 106 107 105 101  CO2 18* 18* 19 21 23   GLUCOSE 127* 120* 118* 115* 145*  BUN 24* 24* 23 29* 29*  CREATININE 1.37* 1.32* 1.35* 1.45* 1.30*  CALCIUM 8.8 8.6 8.7 8.8 9.1  MG  --  1.6  1.8  --   --   ALKPHOS 47  --   --   --   --   AST 27  --   --   --   --   ALT 13  --   --   --   --   ALBUMIN 3.0*  --   --   --   --   - BNP 4042 >  1897.4 - Troponin 0.06 >0.04  Dg Chest Portable 1 View  06/04/2014   CLINICAL DATA:  Weakness and pain in both legs. History of congestive heart failure.  EXAM: PORTABLE CHEST - 1 VIEW  COMPARISON:  None.  FINDINGS: There is moderate enlargement of the cardiopericardial silhouette. No mediastinal or hilar masses or convincing adenopathy.  Lungs are mildly hyperexpanded. There is no lung consolidation or edema. No pleural effusion or pneumothorax.  Bony thorax is demineralized but grossly intact.  IMPRESSION: Significant enlargement of the cardiopericardial silhouette. This may all be cardiomegaly. Consider pericardial effusion in the proper clinical setting.  No evidence of pneumonia or pulmonary edema.   Electronically Signed   By: Lajean Manes M.D.   On: 06/04/2014 15:20   Results/Tests Pending at Time of Discharge: None  Discharge Medications:    Medication List    STOP taking these medications        amLODipine 5 MG tablet  Commonly known as:  NORVASC     doxazosin 4 MG tablet  Commonly known as:  CARDURA     metoprolol 100 MG tablet  Commonly known as:  LOPRESSOR      TAKE these medications        acetaminophen 650 MG CR tablet  Commonly known as:  TYLENOL  One tab by mouth q4-6h as needed pain in knee     aspirin 81 MG tablet  Take 81 mg by mouth daily.     carvedilol 12.5 MG tablet  Commonly known as:  COREG  Take 1 tablet (12.5 mg total) by mouth 2 (two) times daily.     furosemide 40 MG tablet  Commonly known as:  LASIX  Take 1 tablet (40 mg total) by mouth daily.     hydrALAZINE 25 MG tablet  Commonly known as:  APRESOLINE  Take 1.5 tablets (37.5 mg total) by mouth every 8 (eight) hours.     potassium chloride SA 20 MEQ tablet  Commonly known as:  K-DUR,KLOR-CON  Take 1 tablet (20 mEq total) by mouth daily.     warfarin 5 MG tablet  Commonly known as:  COUMADIN  Take as directed by coumadin clinic        Discharge Instructions: Please refer to  Patient Instructions section of EMR for full details.  Patient was counseled important signs and symptoms that should prompt return to medical care, changes in medications, dietary instructions, activity restrictions, and follow up appointments.   Follow-Up Appointments:     Follow-up Information    Follow up with [facility].   Why:  Nurse, physical therapist and occupational therapist to follow up with you at home.    Contact information:   [address] [place] [address] [phone]       Follow up with Andrena Mews, MD On 06/14/2014.   Specialty:  Family Medicine   Why:  @ 8:45am for Hospital Follow Up   Contact information:   [place] [address] [place], [place]  06/10/2014, 9:22 PM PGY-1, [place]

## 2014-06-11 DIAGNOSIS — Z9181 History of falling: Secondary | ICD-10-CM | POA: Diagnosis not present

## 2014-06-11 DIAGNOSIS — M1990 Unspecified osteoarthritis, unspecified site: Secondary | ICD-10-CM | POA: Diagnosis not present

## 2014-06-11 DIAGNOSIS — N183 Chronic kidney disease, stage 3 (moderate): Secondary | ICD-10-CM | POA: Diagnosis not present

## 2014-06-11 DIAGNOSIS — I071 Rheumatic tricuspid insufficiency: Secondary | ICD-10-CM | POA: Diagnosis not present

## 2014-06-11 DIAGNOSIS — I5031 Acute diastolic (congestive) heart failure: Secondary | ICD-10-CM | POA: Diagnosis not present

## 2014-06-11 DIAGNOSIS — D649 Anemia, unspecified: Secondary | ICD-10-CM | POA: Diagnosis not present

## 2014-06-11 DIAGNOSIS — I517 Cardiomegaly: Secondary | ICD-10-CM | POA: Diagnosis not present

## 2014-06-11 DIAGNOSIS — Z7901 Long term (current) use of anticoagulants: Secondary | ICD-10-CM | POA: Diagnosis not present

## 2014-06-11 DIAGNOSIS — I129 Hypertensive chronic kidney disease with stage 1 through stage 4 chronic kidney disease, or unspecified chronic kidney disease: Secondary | ICD-10-CM | POA: Diagnosis not present

## 2014-06-11 DIAGNOSIS — M109 Gout, unspecified: Secondary | ICD-10-CM | POA: Diagnosis not present

## 2014-06-11 DIAGNOSIS — I4891 Unspecified atrial fibrillation: Secondary | ICD-10-CM | POA: Diagnosis not present

## 2014-06-13 DIAGNOSIS — I4891 Unspecified atrial fibrillation: Secondary | ICD-10-CM | POA: Diagnosis not present

## 2014-06-13 DIAGNOSIS — I129 Hypertensive chronic kidney disease with stage 1 through stage 4 chronic kidney disease, or unspecified chronic kidney disease: Secondary | ICD-10-CM | POA: Diagnosis not present

## 2014-06-13 DIAGNOSIS — I517 Cardiomegaly: Secondary | ICD-10-CM | POA: Diagnosis not present

## 2014-06-13 DIAGNOSIS — N183 Chronic kidney disease, stage 3 (moderate): Secondary | ICD-10-CM | POA: Diagnosis not present

## 2014-06-13 DIAGNOSIS — I5031 Acute diastolic (congestive) heart failure: Secondary | ICD-10-CM | POA: Diagnosis not present

## 2014-06-13 DIAGNOSIS — I071 Rheumatic tricuspid insufficiency: Secondary | ICD-10-CM | POA: Diagnosis not present

## 2014-06-14 ENCOUNTER — Encounter: Payer: Self-pay | Admitting: Family Medicine

## 2014-06-14 ENCOUNTER — Telehealth: Payer: Self-pay | Admitting: *Deleted

## 2014-06-14 ENCOUNTER — Ambulatory Visit (INDEPENDENT_AMBULATORY_CARE_PROVIDER_SITE_OTHER): Payer: Medicare Other | Admitting: Family Medicine

## 2014-06-14 VITALS — BP 184/122 | HR 120 | Temp 97.7°F | Ht 65.0 in | Wt 143.3 lb

## 2014-06-14 DIAGNOSIS — I482 Chronic atrial fibrillation: Secondary | ICD-10-CM | POA: Diagnosis not present

## 2014-06-14 DIAGNOSIS — D649 Anemia, unspecified: Secondary | ICD-10-CM

## 2014-06-14 DIAGNOSIS — I5033 Acute on chronic diastolic (congestive) heart failure: Secondary | ICD-10-CM

## 2014-06-14 DIAGNOSIS — M25561 Pain in right knee: Secondary | ICD-10-CM | POA: Diagnosis not present

## 2014-06-14 DIAGNOSIS — I10 Essential (primary) hypertension: Secondary | ICD-10-CM | POA: Diagnosis not present

## 2014-06-14 DIAGNOSIS — M25562 Pain in left knee: Secondary | ICD-10-CM | POA: Diagnosis not present

## 2014-06-14 DIAGNOSIS — E876 Hypokalemia: Secondary | ICD-10-CM | POA: Diagnosis not present

## 2014-06-14 DIAGNOSIS — M25569 Pain in unspecified knee: Secondary | ICD-10-CM | POA: Insufficient documentation

## 2014-06-14 DIAGNOSIS — I4821 Permanent atrial fibrillation: Secondary | ICD-10-CM

## 2014-06-14 MED ORDER — POTASSIUM CHLORIDE CRYS ER 20 MEQ PO TBCR: 20.0000 meq | EXTENDED_RELEASE_TABLET | Freq: Every day | ORAL | Status: DC

## 2014-06-14 NOTE — Progress Notes (Signed)
Subjective:     Patient ID: Jennifer Hinton, female   DOB: [birth year], 79 y.o.   MRN: [id number]  HPI  CHF: Patient was recently d/c from the hospital for CHF exacerbation for which she was d/c home on Lasix 40 mg qd and Coreg 12.5 mg BID. She had been compliant with her medications but indicated that she did not take any of her medicine today. HTN:Was recently d/c for hypertensive emergency. She was discharged home on Coreg 12.5 mg BID, Hydralazine 37.5mg  TID and Lasix 40 mg qd. She has been taken Hydralazine 37.5 mg daily instead of TID. She did not take any medication today. She denies any chest pain, no SOB, no headache, no dizziness. She stated she feels great and that her BP is always high whenever she comes to the doctors office. Her visiting nurse checked her BP at home yesterday and it was normal. She stated she took her medicine then. Hypokalemia: She stated she did not get any script for potassium when she was d/c and had not been taking it. Anemia:Denies any weakness or dizziness, denies blood loss. Here for follow up. Afib:She is on Coumadin 5mg  6 days a week and 2.5 mg on Tuesdays. She follow up with Coumadin check at the Coumadin clinic at [facility]. She stated she has appointment with them tomorrow. She denies any bleeding. Knee pain: C/O B/L Knee pain on going for months, uses Tylenol as needed with minimal improvement.  Current Outpatient Prescriptions on File Prior to Visit  Medication Sig Dispense Refill  . acetaminophen (TYLENOL) 650 MG CR tablet One tab by mouth q4-6h as needed pain in knee     . aspirin 81 MG tablet Take 81 mg by mouth daily.      . carvedilol (COREG) 12.5 MG tablet Take 1 tablet (12.5 mg total) by mouth 2 (two) times daily. 60 tablet 0  . furosemide (LASIX) 40 MG tablet Take 1 tablet (40 mg total) by mouth daily. 30 tablet 0  . hydrALAZINE (APRESOLINE) 25 MG tablet Take 1.5 tablets (37.5 mg total) by mouth every 8 (eight) hours. 135 tablet 0  . potassium  chloride SA (K-DUR,KLOR-CON) 20 MEQ tablet Take 1 tablet (20 mEq total) by mouth daily. (Patient not taking: Reported on 06/04/2014) 2 tablet 0  . warfarin (COUMADIN) 5 MG tablet Take as directed by coumadin clinic (Patient taking differently: Take 2.5-5 mg by mouth daily. Take 2.5 mg (half a tablet) on Tuesday, and take 5 (a whole tablet) on all other days of the week.(Take as directed by coumadin clinic)) 90 tablet 0   No current facility-administered medications on file prior to visit.   Past Medical History  Diagnosis Date  . Atrial fibrillation   . Hyperlipidemia   . Hypertension   . CHF (congestive heart failure)   . Dysrhythmia     A FIB   . RA (rheumatoid arthritis)       Review of Systems  Respiratory: Negative.   Cardiovascular: Negative.   Gastrointestinal: Negative.   Genitourinary: Negative.   Neurological: Negative.   Psychiatric/Behavioral: Negative.   All other systems reviewed and are negative.      Filed Vitals:   06/14/14 0840 06/14/14 0859  BP: 209/146 184/122  Pulse: 120   Temp: 97.7 F (36.5 C)   TempSrc: Oral   Height: 5\' 5"  (1.651 m)   Weight: 143 lb 5 oz (65.006 kg)     Objective:   Physical Exam  Constitutional: She is oriented to  person, place, and time. She appears well-developed. No distress.  Cardiovascular: Normal rate and normal heart sounds.  An irregular rhythm present.  No murmur heard. Pulmonary/Chest: Effort normal and breath sounds normal. No respiratory distress. She has no wheezes.  Abdominal: Soft. Bowel sounds are normal. She exhibits no distension and no mass. There is no tenderness.  Musculoskeletal: She exhibits edema.       Right knee: She exhibits decreased range of motion. Tenderness found.       Left knee: She exhibits decreased range of motion. Tenderness found.  One plus edema B/L just above the ankle.  Neurological: She is alert and oriented to person, place, and time. She has normal reflexes. No cranial nerve  deficit. Coordination normal.  Nursing note and vitals reviewed.      Assessment:     CHF: HTN: Hypokalemia: Anemia: Afib: Knee pain    Plan:     Check problem list.

## 2014-06-14 NOTE — Assessment & Plan Note (Signed)
Her BP is quite high today due to medication non-adherence. Repeat BP improved just some few points. Patient is asymptomatic. It seems her BP is chronically high. I recommended she takes her medication as soon as she gets home and return at about 4 pm today to check her BP with the clinic. I discussed symptoms warranting ED visit. She verbalized understanding and agreed to return to our clinic today for BP recheck. I also reminded her she is to be on Hydralazine 25mg  1.5 mg tab TID plus her Coreg and Lasix. She agreed with plan.

## 2014-06-14 NOTE — Telephone Encounter (Signed)
-----   Message from Kinnie Feil, MD sent at 06/14/2014  2:36 PM EDT ----- Please call patient to schedule nurse visit for BP check today or tomorrow. Thank you.

## 2014-06-14 NOTE — Telephone Encounter (Signed)
Pt scheduled an appt for tomorrow at 9:45 AM for blood pressure check.  Derl Barrow, RN

## 2014-06-14 NOTE — Assessment & Plan Note (Signed)
She had not been taking her KDUR. I refilled her potassium supplement and advise she pikc up her medication today. BMET checked again.  I will call her with result.

## 2014-06-14 NOTE — Patient Instructions (Signed)
It was nice seeing you today. You blood pressure is pretty high, it could be because you are yet to take your medicine today. I am glad you feel otherwise ok. Please take your BP medication as soon as possible and return to our clinic later today to recheck your BP.  These are the medicine you should be on. 1. Coreg 12.5 mg twice a day. 2. Hydrazine 25 mg. 1.5tablet three times daily. 3. Lasix 40 mg daily. 4. Potassium 52meq/l daily.  Please ensure that you take all these medicine. Also start iron pills which you can get OTC for anemia. F/U in 1 wk with your PCP.

## 2014-06-14 NOTE — Assessment & Plan Note (Signed)
Compliant with Coumadin. She seems to have established care with [facility]. She will follow up with them tomorrow for coumadin management. Continue Coreg for rate control.

## 2014-06-14 NOTE — Assessment & Plan Note (Signed)
S/P hospital discharge. Currently stable. Still has some B/L pedal swelling. No respiratory distress and lung exam was clear. Continue Lasix 40 mg qd. Continue Coreg 12.5 mg BID and ASA. Continue home weight check. Call if gaining weight despite taking Lasix.

## 2014-06-14 NOTE — Assessment & Plan Note (Signed)
Likely due to arthritis. I recommended continuing Tylenol prn pain for now. Consider PT referral at next visit.

## 2014-06-14 NOTE — Assessment & Plan Note (Signed)
No acute blood loss. May be due to her CHF or other chronic health conditions. I recommended OTC ferrous sulphate. F/U with PCP to review her colon cancer screening record at next visit.

## 2014-06-15 ENCOUNTER — Ambulatory Visit (INDEPENDENT_AMBULATORY_CARE_PROVIDER_SITE_OTHER): Payer: Medicare Other | Admitting: Cardiology

## 2014-06-15 ENCOUNTER — Encounter: Payer: Self-pay | Admitting: Family Medicine

## 2014-06-15 ENCOUNTER — Encounter: Payer: Self-pay | Admitting: Cardiology

## 2014-06-15 ENCOUNTER — Ambulatory Visit (INDEPENDENT_AMBULATORY_CARE_PROVIDER_SITE_OTHER): Payer: Medicare Other | Admitting: Surgery

## 2014-06-15 VITALS — BP 190/116 | HR 114 | Ht 65.0 in | Wt 141.8 lb

## 2014-06-15 DIAGNOSIS — I482 Chronic atrial fibrillation: Secondary | ICD-10-CM

## 2014-06-15 DIAGNOSIS — I4891 Unspecified atrial fibrillation: Secondary | ICD-10-CM | POA: Diagnosis not present

## 2014-06-15 DIAGNOSIS — Z5181 Encounter for therapeutic drug level monitoring: Secondary | ICD-10-CM

## 2014-06-15 DIAGNOSIS — I10 Essential (primary) hypertension: Secondary | ICD-10-CM

## 2014-06-15 DIAGNOSIS — I4821 Permanent atrial fibrillation: Secondary | ICD-10-CM

## 2014-06-15 LAB — BASIC METABOLIC PANEL
BUN: 32 mg/dL — ABNORMAL HIGH (ref 6–23)
CALCIUM: 9.6 mg/dL (ref 8.4–10.5)
CO2: 26 mEq/L (ref 19–32)
CREATININE: 0.99 mg/dL (ref 0.50–1.10)
Chloride: 102 mEq/L (ref 96–112)
GLUCOSE: 111 mg/dL — AB (ref 70–99)
Potassium: 4.1 mEq/L (ref 3.5–5.3)
Sodium: 138 mEq/L (ref 135–145)

## 2014-06-15 LAB — POCT INR: INR: 2

## 2014-06-15 MED ORDER — CARVEDILOL 25 MG PO TABS: 25.0000 mg | ORAL_TABLET | Freq: Two times a day (BID) | ORAL | Status: DC

## 2014-06-15 MED ORDER — AMLODIPINE BESYLATE 5 MG PO TABS: 5.0000 mg | ORAL_TABLET | Freq: Every day | ORAL | Status: DC

## 2014-06-15 NOTE — Progress Notes (Signed)
06/15/2014 Jennifer Hinton   1935/09/17  OB:[id number]  Primary Physician Tommi Rumps, MD Primary Cardiologist: Dr. Stanford Breed  Reason for Visit/CC:  HPI:  The patient is a 79 y/o female, who has been followed in our Coumadin clinic for several years for anticoagulation for permament atrial fibrillation.  Other PMH includes malignant HTN, hypertensive cardiovascular disase with LVH and chronic disastolic HF, pulmonary HTN and tricuspid regurgitation.  She was recently admitted to [facility] for acute on chronic diastolic CHF in the setting of hypertensive urgency. Her antihypertensives were adjusted and she was diuresed with IV lasix. BP improved. 2D echo showed normal LVF with EF of 60%. No wall motion abnormalities. She was discharged home on Coreg, hydralazine, lasix and coumadin.   She presented to Coumadin clinic today for a routien INR check. Routien vitals revealed severe hypertension with initial BP of 190/122. Patient was added on to Flex schedule for evaluation. She is asymptomatic, denying chest pain, dyspnea, HA, dizziness, syncope, near syncope. She reports full medication compliance. Not fully compliant with low sodium diet. She denies any weight gain, LEE, orthopnea or PND. No abnormal bleeding on Coumadin. She has an [facility] RN that visits her home 3 days a week.   BP was rechecked in clinic and was 190/116. This was rechecked several minutes later and was 160/105. EKG shows afib with rate of 98 bpm.     Current Outpatient Prescriptions  Medication Sig Dispense Refill  . acetaminophen (TYLENOL) 650 MG CR tablet One tab by mouth q4-6h as needed pain in knee     . aspirin 81 MG tablet Take 81 mg by mouth daily.      . hydrALAZINE (APRESOLINE) 25 MG tablet Take 1.5 tablets (37.5 mg total) by mouth every 8 (eight) hours. 135 tablet 0  . potassium chloride SA (K-DUR,KLOR-CON) 20 MEQ tablet Take 1 tablet (20 mEq total) by mouth daily. 15 tablet 0  . warfarin (COUMADIN) 5 MG  tablet Take as directed by coumadin clinic (Patient taking differently: Take 2.5-5 mg by mouth daily. Take 2.5 mg (half a tablet) on Tuesday, and take 5 (a whole tablet) on all other days of the week.(Take as directed by coumadin clinic)) 90 tablet 0  . [DISCONTINUED] carvedilol (COREG) 12.5 MG tablet Take 1 tablet (12.5 mg total) by mouth 2 (two) times daily. (Patient not taking: Reported on 06/14/2014) 60 tablet 0   No current facility-administered medications for this visit.    No Known Allergies  History   Social History  . Marital Status: Widowed    Spouse Name: N/A  . Number of Children: 6  . Years of Education: 9th grade    Occupational History  . 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 Smoking status: Never Smoker   . Smokeless tobacco: Never Used  . Alcohol Use: No  . Drug Use: No  . Sexual Activity: Not on file   Other Topics Concern  . Not on file   Social History Narrative   Had 6 children.   5 children living (oldest daughter passed away).    2 living in [place], 2 living in [place], 1 living in [place].       Raising your 59 yo great grandson Dorris Fetch, also a clinic patient).    Drives. Recently renewed license. Does not own a car so transportation is sometimes a limiting  factor.               Review of Systems: General: negative for chills, fever, night sweats or weight changes.  Cardiovascular: negative for chest pain, dyspnea on exertion, edema, orthopnea, palpitations, paroxysmal nocturnal dyspnea or shortness of breath Dermatological: negative for rash Respiratory: negative for cough or wheezing Urologic: negative for hematuria Abdominal: negative for nausea, vomiting, diarrhea, bright red blood per rectum, melena, or hematemesis Neurologic: negative for visual changes, syncope, or dizziness All other systems reviewed and are otherwise negative except as noted above.    Blood pressure 190/116,  pulse 114, height 5\' 5"  (1.651 m), weight 141 lb 12.8 oz (64.32 kg).  Recheck BP after rest 160/105 General appearance: alert, cooperative and no distress Neck: no carotid bruit and no JVD Lungs: clear to auscultation bilaterally Heart: irregularly irregular rhythm Extremities: no LEE Pulses: 2+ and symmetric Skin: warm and dry Neurologic: Grossly normal  EKG atrial fibrillation HR 93 bpm  ASSESSMENT AND PLAN:    1. Hypertension: patient has a h/o severe hypertension/ hypertensive cardiovascular disease with LVH and diastolic dysfunction. BP is elevated today in clinic, despite taking all of her meds earlier this am. She remains asymptomatic without CP, dyspnea, HA or confusion, Her Pulse rate is also elevated. 98 bpm by EKG. We will further increase her Coreg to 25 mg BID. Will also add 5 mg of amlodipine as CCBs work well in [place] patients. Continue TID hydralazine and Lasix. We discussed importance of low sodium diet. Order given for [facility] RN to monitor BP. F/U in 1 week.  2. Chronic Diastolic CHF: evoulemic on exam w/o dyspnea. However, BP is elevated. Meds adjusted. Stressed low sodium diet and continuation of daily weights. Continue daily Lasix.  3. Chronic Atrial Fibrillation: resting rate mildly elevated in the 90s-low 100s. Asymptomatic. Will increase Coreg to 25 mg BID. Continue Coumadin.    PLAN  Increase Coreg to 25 mg BID. Add 5 mg of amlodipine.   Mardie Kellen, BRITTAINYPA-C 06/15/2014 9:38 AM

## 2014-06-15 NOTE — Patient Instructions (Addendum)
Medication Instructions:   START TAKING COREG 25 MG TWICE A DAY   START TAKING AMLODIPINE 5MG  ONCE DAY   Labwork: NONE TODAY   Testing/Procedures: NONE TODAY   Follow-Up: FOLLOW UP WITH A FLEX PROVIDER IN ONE WEEK 06/22/14  Any Other Special Instructions Will Be Listed Below (If Applicable).

## 2014-06-16 ENCOUNTER — Telehealth: Payer: Self-pay | Admitting: *Deleted

## 2014-06-16 DIAGNOSIS — I4891 Unspecified atrial fibrillation: Secondary | ICD-10-CM | POA: Diagnosis not present

## 2014-06-16 DIAGNOSIS — I517 Cardiomegaly: Secondary | ICD-10-CM | POA: Diagnosis not present

## 2014-06-16 DIAGNOSIS — I5031 Acute diastolic (congestive) heart failure: Secondary | ICD-10-CM | POA: Diagnosis not present

## 2014-06-16 DIAGNOSIS — I129 Hypertensive chronic kidney disease with stage 1 through stage 4 chronic kidney disease, or unspecified chronic kidney disease: Secondary | ICD-10-CM | POA: Diagnosis not present

## 2014-06-16 DIAGNOSIS — N183 Chronic kidney disease, stage 3 (moderate): Secondary | ICD-10-CM | POA: Diagnosis not present

## 2014-06-16 DIAGNOSIS — I071 Rheumatic tricuspid insufficiency: Secondary | ICD-10-CM | POA: Diagnosis not present

## 2014-06-16 NOTE — Telephone Encounter (Signed)
Verbal orders can be given. 

## 2014-06-16 NOTE — Telephone Encounter (Signed)
Left voice message for Shanon Brow to return nurse call.  Derl Barrow, RN

## 2014-06-16 NOTE — Telephone Encounter (Signed)
Shanon Brow, Physical Therapist with [facility] called to request verbal orders for physical therapy 2 times a week for 4 weeks to help with gait, balance, endurance and strength.  Please give him a call at [phone]. Derl Barrow, RN

## 2014-06-16 NOTE — Telephone Encounter (Signed)
Verbal order given by Dr. Caryl Bis given to Shanon Brow for physical therapy 2 times a week for 4 weeks.  Derl Barrow, RN

## 2014-06-17 DIAGNOSIS — I071 Rheumatic tricuspid insufficiency: Secondary | ICD-10-CM | POA: Diagnosis not present

## 2014-06-17 DIAGNOSIS — I129 Hypertensive chronic kidney disease with stage 1 through stage 4 chronic kidney disease, or unspecified chronic kidney disease: Secondary | ICD-10-CM | POA: Diagnosis not present

## 2014-06-17 DIAGNOSIS — I4891 Unspecified atrial fibrillation: Secondary | ICD-10-CM | POA: Diagnosis not present

## 2014-06-17 DIAGNOSIS — N183 Chronic kidney disease, stage 3 (moderate): Secondary | ICD-10-CM | POA: Diagnosis not present

## 2014-06-17 DIAGNOSIS — I517 Cardiomegaly: Secondary | ICD-10-CM | POA: Diagnosis not present

## 2014-06-17 DIAGNOSIS — I5031 Acute diastolic (congestive) heart failure: Secondary | ICD-10-CM | POA: Diagnosis not present

## 2014-06-18 DIAGNOSIS — I129 Hypertensive chronic kidney disease with stage 1 through stage 4 chronic kidney disease, or unspecified chronic kidney disease: Secondary | ICD-10-CM | POA: Diagnosis not present

## 2014-06-18 DIAGNOSIS — N183 Chronic kidney disease, stage 3 (moderate): Secondary | ICD-10-CM | POA: Diagnosis not present

## 2014-06-18 DIAGNOSIS — I4891 Unspecified atrial fibrillation: Secondary | ICD-10-CM | POA: Diagnosis not present

## 2014-06-18 DIAGNOSIS — I517 Cardiomegaly: Secondary | ICD-10-CM | POA: Diagnosis not present

## 2014-06-18 DIAGNOSIS — I5031 Acute diastolic (congestive) heart failure: Secondary | ICD-10-CM | POA: Diagnosis not present

## 2014-06-18 DIAGNOSIS — I071 Rheumatic tricuspid insufficiency: Secondary | ICD-10-CM | POA: Diagnosis not present

## 2014-06-21 DIAGNOSIS — I5031 Acute diastolic (congestive) heart failure: Secondary | ICD-10-CM | POA: Diagnosis not present

## 2014-06-21 DIAGNOSIS — I071 Rheumatic tricuspid insufficiency: Secondary | ICD-10-CM | POA: Diagnosis not present

## 2014-06-21 DIAGNOSIS — I129 Hypertensive chronic kidney disease with stage 1 through stage 4 chronic kidney disease, or unspecified chronic kidney disease: Secondary | ICD-10-CM | POA: Diagnosis not present

## 2014-06-21 DIAGNOSIS — I4891 Unspecified atrial fibrillation: Secondary | ICD-10-CM | POA: Diagnosis not present

## 2014-06-21 DIAGNOSIS — I517 Cardiomegaly: Secondary | ICD-10-CM | POA: Diagnosis not present

## 2014-06-21 DIAGNOSIS — N183 Chronic kidney disease, stage 3 (moderate): Secondary | ICD-10-CM | POA: Diagnosis not present

## 2014-06-22 ENCOUNTER — Ambulatory Visit (INDEPENDENT_AMBULATORY_CARE_PROVIDER_SITE_OTHER): Payer: Medicare Other | Admitting: Nurse Practitioner

## 2014-06-22 ENCOUNTER — Encounter: Payer: Self-pay | Admitting: Nurse Practitioner

## 2014-06-22 ENCOUNTER — Ambulatory Visit: Payer: Medicare Other | Admitting: Family Medicine

## 2014-06-22 VITALS — BP 160/80 | HR 91 | Ht 65.0 in | Wt 140.0 lb

## 2014-06-22 DIAGNOSIS — I129 Hypertensive chronic kidney disease with stage 1 through stage 4 chronic kidney disease, or unspecified chronic kidney disease: Secondary | ICD-10-CM | POA: Diagnosis not present

## 2014-06-22 DIAGNOSIS — I10 Essential (primary) hypertension: Secondary | ICD-10-CM | POA: Diagnosis not present

## 2014-06-22 DIAGNOSIS — I482 Chronic atrial fibrillation: Secondary | ICD-10-CM

## 2014-06-22 DIAGNOSIS — I517 Cardiomegaly: Secondary | ICD-10-CM | POA: Diagnosis not present

## 2014-06-22 DIAGNOSIS — I4891 Unspecified atrial fibrillation: Secondary | ICD-10-CM | POA: Diagnosis not present

## 2014-06-22 DIAGNOSIS — I071 Rheumatic tricuspid insufficiency: Secondary | ICD-10-CM | POA: Diagnosis not present

## 2014-06-22 DIAGNOSIS — N183 Chronic kidney disease, stage 3 (moderate): Secondary | ICD-10-CM | POA: Diagnosis not present

## 2014-06-22 DIAGNOSIS — I5031 Acute diastolic (congestive) heart failure: Secondary | ICD-10-CM | POA: Diagnosis not present

## 2014-06-22 DIAGNOSIS — I4821 Permanent atrial fibrillation: Secondary | ICD-10-CM

## 2014-06-22 MED ORDER — HYDRALAZINE HCL 50 MG PO TABS: 50.0000 mg | ORAL_TABLET | Freq: Three times a day (TID) | ORAL | Status: DC

## 2014-06-22 NOTE — Patient Instructions (Addendum)
We will be checking the following labs today - NONE  Medication Instructions:    Continue with your current medicines but  I am increasing the Hydralazine to 50 mg three times a day - you can take 2 of your 25 mg tablets three times a day to use up - I am giving you the RX for the 50 mg tablet.  Follow this list that you are given today.    Testing/Procedures To Be Arranged:  N/A  Follow-Up:   See Dr. Stanford Breed in 3 months    Other Special Instructions:   N/A  Call the [facility] office at [phone] if you have any questions, problems or concerns.

## 2014-06-22 NOTE — Progress Notes (Signed)
CARDIOLOGY OFFICE NOTE  Date:  06/22/2014    Jennifer Hinton Date of Birth: 01/30/1936 Medical Record [id number]  PCP:  Tommi Rumps, MD  Cardiologist:  Stanford Breed    Chief Complaint  Patient presents with  . Hypertension    Follow up visit - seen for Dr. Stanford Breed.      History of Present Illness: Jennifer Hinton is a 79 y.o. female who presents today for a follow up visit. Seen for Dr. Stanford Breed (he saw her in consult 06/05/2014).  She has been followed in our Coumadin clinic for several years for anticoagulation for permament atrial fibrillation. Other PMH includes malignant HTN, hypertensive cardiovascular disase with LVH and chronic disastolic HF, pulmonary HTN and tricuspid regurgitation.  She was recently admitted last month to [facility] for acute on chronic diastolic CHF in the setting of hypertensive urgency. Her antihypertensives were adjusted and she was diuresed with IV lasix. BP improved. 2D echo showed normal LVF with EF of 60%. No wall motion abnormalities. She was discharged home on Coreg, hydralazine, lasix and coumadin.   She presented to Coumadin clinic a week ago for a routine INR check. Routine vitals revealed severe hypertension with initial BP of 190/122. Patient was added on to Flex schedule for evaluation. She was asymptomatic, denying chest pain, dyspnea, HA, dizziness, syncope, near syncope. She reported full medication compliance. Not fully compliant with low sodium diet. Medicines were adjusted - Coreg increased and Norvasc was added.   Comes back today. Here with her daughter. BP is still up but not as high as last week. She is not taking the Norvasc. She is not really sure why she is not on - sounds like she did not get from the pharmacy. She is on Lasix - it is not on her list. But she does take every day. Daughter feels like she is doing well. No chest pain. Not short of breath. Limited by arthritis. Restricting salt.    Past Medical History  Diagnosis Date    . Atrial fibrillation   . Hyperlipidemia   . Hypertension   . CHF (congestive heart failure)   . Dysrhythmia     A FIB   . RA (rheumatoid arthritis)     Past Surgical History  Procedure Laterality Date  . No past surgeries       Medications: Current Outpatient Prescriptions  Medication Sig Dispense Refill  . acetaminophen (TYLENOL) 650 MG CR tablet One tab by mouth q4-6h as needed pain in knee     . aspirin 81 MG tablet Take 81 mg by mouth daily.      . carvedilol (COREG) 25 MG tablet Take 1 tablet (25 mg total) by mouth 2 (two) times daily. 60 tablet 6  . furosemide (LASIX) 40 MG tablet Take 40 mg by mouth.    . potassium chloride SA (K-DUR,KLOR-CON) 20 MEQ tablet Take 1 tablet (20 mEq total) by mouth daily. 15 tablet 0  . warfarin (COUMADIN) 5 MG tablet Take as directed by coumadin clinic (Patient taking differently: Take 2.5-5 mg by mouth daily. Take 2.5 mg (half a tablet) on Tuesday, and take 5 (a whole tablet) on all other days of the week.(Take as directed by coumadin clinic)) 90 tablet 0  . hydrALAZINE (APRESOLINE) 50 MG tablet Take 1 tablet (50 mg total) by mouth 3 (three) times daily. 90 tablet 11   No current facility-administered medications for this visit.    Allergies: No Known Allergies  Social History: The patient  reports  that she has never smoked. She has never used smokeless tobacco. She reports that she does not drink alcohol or use illicit drugs.   Family History: The patient's family history includes Heart disease in her brother; Hyperlipidemia in her brother.   Review of Systems: Please see the history of present illness.   Otherwise, the review of systems is positive for arthritis.   All other systems are reviewed and negative.   Physical Exam: VS:  BP 160/80 mmHg  Pulse 91  Ht 5\' 5"  (1.651 m)  Wt 140 lb (63.504 kg)  BMI 23.30 kg/m2  SpO2 100% .  BMI Body mass index is 23.3 kg/(m^2).  Wt Readings from Last 3 Encounters:  06/22/14 140 lb  (63.504 kg)  06/15/14 141 lb 12.8 oz (64.32 kg)  06/14/14 143 lb 5 oz (65.006 kg)    General: Pleasant. Well developed, well nourished and in no acute distress.  HEENT: Normal. Neck: Supple, no JVD, carotid bruits, or masses noted.  Cardiac: Irregular irregular rhythm. Her rate is ok. No murmurs, rubs, or gallops. No edema.  Respiratory:  Lungs are clear to auscultation bilaterally with normal work of breathing.  GI: Soft and nontender.  MS: No deformity or atrophy. Gait and ROM intact.Using a cane.  Skin: Warm and dry. Color is normal.  Neuro:  Strength and sensation are intact and no gross focal deficits noted.  Psych: Alert, appropriate and with normal affect.   LABORATORY DATA:  EKG:  EKG is not ordered today.   Lab Results  Component Value Date   WBC 6.5 06/05/2014   HGB 10.8* 06/05/2014   HCT 32.1* 06/05/2014   PLT 122* 06/05/2014   GLUCOSE 111* 06/14/2014   CHOL 100 03/17/2014   TRIG 60 03/17/2014   HDL 36* 03/17/2014   LDLDIRECT 78 09/10/2011   LDLCALC 52 03/17/2014   ALT 13 06/04/2014   AST 27 06/04/2014   NA 138 06/14/2014   K 4.1 06/14/2014   CL 102 06/14/2014   CREATININE 0.99 06/14/2014   BUN 32* 06/14/2014   CO2 26 06/14/2014   TSH 1.669 08/19/2006   INR 2.0 06/15/2014    BNP (last 3 results)  Recent Labs  06/04/14 1505 06/07/14 0452  BNP 4042.0* 1897.4*    ProBNP (last 3 results) No results for input(s): PROBNP in the last 8760 hours.   Other Studies Reviewed Today:  Echo Study Conclusions from 05/2014  - Left ventricle: The cavity size was normal. Wall thickness was increased in a pattern of moderate to severe LVH. The estimated ejection fraction was 60%. Wall motion was normal; there were no regional wall motion abnormalities. - Aortic valve: Sclerosis without stenosis. There was mild regurgitation. - Mitral valve: Mildly thickened leaflets . There was moderate regurgitation. - Left atrium: The atrium was severely  dilated. - Right ventricle: The cavity size was mildly dilated. Systolic function was mildly to moderately reduced. - Right atrium: The atrium was severely dilated. - Tricuspid valve: There was severe regurgitation. - Pulmonary arteries: PA peak pressure: 75 mm Hg (S). - Pericardium, extracardiac: A small pericardial effusion was identified posterior to the heart.   Assessment/Plan:  1. Hypertension: patient has a h/o severe hypertension/ hypertensive cardiovascular disease with LVH and diastolic dysfunction. BP has improved - will NOT start the Norvasc but will increase her Hydralazine to 50 mg TID.   2. Chronic Diastolic CHF: evoulemic on exam w/o dyspnea. However, BP is elevated. Meds adjusted. Stressed low sodium diet and continuation of daily weights.  Continue daily Lasix.  3. Chronic Atrial Fibrillation: managed with rate control and coumadin.  4. Chronic coumadin therapy - no problems noted.    Current medicines are reviewed with the patient today.  The patient does not have concerns regarding medicines other than what has been noted above.  The following changes have been made:  See above.  Labs/ tests ordered today include:   No orders of the defined types were placed in this encounter.     Disposition:   FU with Dr. Stanford Breed in 3 months.   Patient is agreeable to this plan and will call if any problems develop in the interim.   Signed: Burtis Junes, RN, ANP-C 06/22/2014 9:02 AM  [facility] [address] [place], [place]  [address] Phone: [phone] Fax: [phone]

## 2014-06-23 DIAGNOSIS — I071 Rheumatic tricuspid insufficiency: Secondary | ICD-10-CM | POA: Diagnosis not present

## 2014-06-23 DIAGNOSIS — N183 Chronic kidney disease, stage 3 (moderate): Secondary | ICD-10-CM | POA: Diagnosis not present

## 2014-06-23 DIAGNOSIS — I5031 Acute diastolic (congestive) heart failure: Secondary | ICD-10-CM | POA: Diagnosis not present

## 2014-06-23 DIAGNOSIS — I517 Cardiomegaly: Secondary | ICD-10-CM | POA: Diagnosis not present

## 2014-06-23 DIAGNOSIS — I129 Hypertensive chronic kidney disease with stage 1 through stage 4 chronic kidney disease, or unspecified chronic kidney disease: Secondary | ICD-10-CM | POA: Diagnosis not present

## 2014-06-23 DIAGNOSIS — I4891 Unspecified atrial fibrillation: Secondary | ICD-10-CM | POA: Diagnosis not present

## 2014-06-24 DIAGNOSIS — I517 Cardiomegaly: Secondary | ICD-10-CM | POA: Diagnosis not present

## 2014-06-24 DIAGNOSIS — N183 Chronic kidney disease, stage 3 (moderate): Secondary | ICD-10-CM | POA: Diagnosis not present

## 2014-06-24 DIAGNOSIS — I5031 Acute diastolic (congestive) heart failure: Secondary | ICD-10-CM | POA: Diagnosis not present

## 2014-06-24 DIAGNOSIS — I071 Rheumatic tricuspid insufficiency: Secondary | ICD-10-CM | POA: Diagnosis not present

## 2014-06-24 DIAGNOSIS — I4891 Unspecified atrial fibrillation: Secondary | ICD-10-CM | POA: Diagnosis not present

## 2014-06-24 DIAGNOSIS — I129 Hypertensive chronic kidney disease with stage 1 through stage 4 chronic kidney disease, or unspecified chronic kidney disease: Secondary | ICD-10-CM | POA: Diagnosis not present

## 2014-06-25 ENCOUNTER — Other Ambulatory Visit: Payer: Self-pay | Admitting: Pharmacist Clinician (PhC)/ Clinical Pharmacy Specialist

## 2014-06-25 MED ORDER — WARFARIN SODIUM 5 MG PO TABS: ORAL_TABLET | ORAL | Status: DC

## 2014-06-27 DIAGNOSIS — N183 Chronic kidney disease, stage 3 (moderate): Secondary | ICD-10-CM | POA: Diagnosis not present

## 2014-06-27 DIAGNOSIS — I517 Cardiomegaly: Secondary | ICD-10-CM | POA: Diagnosis not present

## 2014-06-27 DIAGNOSIS — I129 Hypertensive chronic kidney disease with stage 1 through stage 4 chronic kidney disease, or unspecified chronic kidney disease: Secondary | ICD-10-CM | POA: Diagnosis not present

## 2014-06-27 DIAGNOSIS — I071 Rheumatic tricuspid insufficiency: Secondary | ICD-10-CM | POA: Diagnosis not present

## 2014-06-27 DIAGNOSIS — I5031 Acute diastolic (congestive) heart failure: Secondary | ICD-10-CM | POA: Diagnosis not present

## 2014-06-27 DIAGNOSIS — I4891 Unspecified atrial fibrillation: Secondary | ICD-10-CM | POA: Diagnosis not present

## 2014-06-28 ENCOUNTER — Other Ambulatory Visit: Payer: Self-pay | Admitting: Family Medicine

## 2014-06-28 DIAGNOSIS — I517 Cardiomegaly: Secondary | ICD-10-CM | POA: Diagnosis not present

## 2014-06-28 DIAGNOSIS — I071 Rheumatic tricuspid insufficiency: Secondary | ICD-10-CM | POA: Diagnosis not present

## 2014-06-28 DIAGNOSIS — I4891 Unspecified atrial fibrillation: Secondary | ICD-10-CM | POA: Diagnosis not present

## 2014-06-28 DIAGNOSIS — E876 Hypokalemia: Secondary | ICD-10-CM

## 2014-06-28 DIAGNOSIS — I5031 Acute diastolic (congestive) heart failure: Secondary | ICD-10-CM | POA: Diagnosis not present

## 2014-06-28 DIAGNOSIS — I129 Hypertensive chronic kidney disease with stage 1 through stage 4 chronic kidney disease, or unspecified chronic kidney disease: Secondary | ICD-10-CM | POA: Diagnosis not present

## 2014-06-28 DIAGNOSIS — N183 Chronic kidney disease, stage 3 (moderate): Secondary | ICD-10-CM | POA: Diagnosis not present

## 2014-06-28 NOTE — Telephone Encounter (Signed)
Refill given

## 2014-06-29 NOTE — Telephone Encounter (Signed)
Pt has an appt on 07/01/2014

## 2014-06-29 NOTE — Telephone Encounter (Signed)
Patient needs to come in to have potassium checked. It was in the normal range at her last visit. She was to be on short term potassium supplement, though she may need some supplementation with being on lasix now. Really need BMET to figure this out. Will place future order. Please let patient know this. Thanks.

## 2014-06-30 DIAGNOSIS — I517 Cardiomegaly: Secondary | ICD-10-CM | POA: Diagnosis not present

## 2014-06-30 DIAGNOSIS — I129 Hypertensive chronic kidney disease with stage 1 through stage 4 chronic kidney disease, or unspecified chronic kidney disease: Secondary | ICD-10-CM | POA: Diagnosis not present

## 2014-06-30 DIAGNOSIS — I5031 Acute diastolic (congestive) heart failure: Secondary | ICD-10-CM | POA: Diagnosis not present

## 2014-06-30 DIAGNOSIS — N183 Chronic kidney disease, stage 3 (moderate): Secondary | ICD-10-CM | POA: Diagnosis not present

## 2014-06-30 DIAGNOSIS — I4891 Unspecified atrial fibrillation: Secondary | ICD-10-CM | POA: Diagnosis not present

## 2014-06-30 DIAGNOSIS — I071 Rheumatic tricuspid insufficiency: Secondary | ICD-10-CM | POA: Diagnosis not present

## 2014-07-01 ENCOUNTER — Ambulatory Visit (INDEPENDENT_AMBULATORY_CARE_PROVIDER_SITE_OTHER): Payer: Medicare Other | Admitting: Family Medicine

## 2014-07-01 ENCOUNTER — Encounter: Payer: Self-pay | Admitting: Family Medicine

## 2014-07-01 VITALS — BP 178/100 | HR 93 | Temp 97.9°F | Ht 65.0 in | Wt 140.0 lb

## 2014-07-01 DIAGNOSIS — M179 Osteoarthritis of knee, unspecified: Secondary | ICD-10-CM

## 2014-07-01 DIAGNOSIS — I517 Cardiomegaly: Secondary | ICD-10-CM | POA: Diagnosis not present

## 2014-07-01 DIAGNOSIS — N183 Chronic kidney disease, stage 3 (moderate): Secondary | ICD-10-CM | POA: Diagnosis not present

## 2014-07-01 DIAGNOSIS — I4891 Unspecified atrial fibrillation: Secondary | ICD-10-CM | POA: Diagnosis not present

## 2014-07-01 DIAGNOSIS — IMO0002 Reserved for concepts with insufficient information to code with codable children: Secondary | ICD-10-CM

## 2014-07-01 DIAGNOSIS — I5033 Acute on chronic diastolic (congestive) heart failure: Secondary | ICD-10-CM

## 2014-07-01 DIAGNOSIS — I129 Hypertensive chronic kidney disease with stage 1 through stage 4 chronic kidney disease, or unspecified chronic kidney disease: Secondary | ICD-10-CM | POA: Diagnosis not present

## 2014-07-01 DIAGNOSIS — M1710 Unilateral primary osteoarthritis, unspecified knee: Secondary | ICD-10-CM

## 2014-07-01 DIAGNOSIS — I071 Rheumatic tricuspid insufficiency: Secondary | ICD-10-CM | POA: Diagnosis not present

## 2014-07-01 DIAGNOSIS — I5031 Acute diastolic (congestive) heart failure: Secondary | ICD-10-CM | POA: Diagnosis not present

## 2014-07-01 NOTE — Progress Notes (Signed)
Patient ID: Jennifer Hinton, female   DOB: 03/29/1935, 79 y.o.   MRN: [id number] Subjective:   CC: Hospital follow-up, knee pain  HPI:   Knee pain  Patient has chronic arthritis in her knees, and states that for the past couple days she has had increasing right knee pain due to the rain and weather changes. She has been taking Tylenol which helps. She denies swelling, warmth, redness, or buckling. She would not like an injection at this time.   CHF hospital follow-up Hospitalized 06/04/14 to 06/08/2014 for new onset heart failure with preserved EF. Euvolemic on recent hospital follow-up at cardiologist office 06/22/2014, stressed low sodium diet at that time and continue daily weights and Lasix. 40 mg daily. She has been compliant with this but ran out yesterday. She has been getting advanced home care home health nursing and physical therapy which is helping with her strength. She feels well and daughter agrees that she is looking well. She denies any increased lower extremity swelling or shortness of breath. Blood pressures have been elevated, she was seen by cardiology 06/22/2014 who increased hydralazine to 50 mg 3 times a day. She states that at home, blood pressures have been normal in [id number] systolic. She has been voiding normally.  Review of Systems - Per HPI.   PMH - tricuspid regurgitation, pulmonary hypertension, atrial fibrillation, OSA, obesity, malnutrition, malignant hypertension, gout, falls, CKD stage III, dyslipidemia, acute on chronic diastolic CHF   Objective:  Physical Exam BP 178/100 mmHg  Pulse 93  Temp(Src) 97.9 F (36.6 C) (Oral)  Ht 5\' 5"  (1.651 m)  Wt 140 lb (63.504 kg)  BMI 23.30 kg/m2  SpO2 92% GEN: NAD CV: Irregularly irregular, no murmurs rubs or gallops Pulmonary: Clear to auscultation bilaterally, normal effort, no wheezes or crackles Abdomen: Soft, nontender Extremity: Trace lower extremity edema, no calf tenderness, no asymmetry,  left upper arm with  massive fluid-filled, nontender soft, nontense mass, not pulsating, not warm Right knee hypertrophic, no erythema or swelling, no anterior tenderness, lateral joint line tenderness is present, decreased range of motion in extension and flexion, no instability to Lachman's or posterior drawer sign Full hip range of motion with no reproduced pain     Signed consent obtained from patient to take picture/place in chart. Consent scanned into chart by CMA.  Assessment:     Jennifer Hinton is a 79 y.o. female here for hospital follow up for CHF and right knee pain.    Plan:     # See problem list and after visit summary for problem-specific plans. - Incidentally, noticed massive bulge patient's left upper arm, nontender, per pt present >20 years, no erythema but massive in size. Picture uploaded into chart, defer to PCP to monitor/manage. Consider [place] to evaluate cystic vs mass.  Follow-up: Follow up in 2-4 weeks for f/u of knee pain.   Hilton Sinclair, MD [place]

## 2014-07-01 NOTE — Patient Instructions (Signed)
Please pick up your Lasix. Dr. Caryl Bis just refilled it the 10th. Check your blood pressure 3-4 times this week and call us or cardiology clinic with these values.  Take Tylenol every 8 hours as needed. Work on Teacher, early years/pre with the exercises I printed. Consider purchasing a knee sleeve. Follow-up in 2-4 weeks as needed for knee pain. If you would like an injection at that time, let us know.

## 2014-07-03 NOTE — Assessment & Plan Note (Signed)
Right knee OA with occasional flaring. No signs/symptoms of infection or instability. Patient declines injection at this time. -Tylenol 650 mg every 8 hours when necessary. -Quadriceps strengthening exercises provided. -Discussed considering wearing a knee sleeve during the daytime. -Follow-up in 2-4 weeks for knee pain. Can rediscuss injection at that time.

## 2014-07-03 NOTE — Assessment & Plan Note (Addendum)
CHF appears well controlled at this time, compliant with Lasix.  -Urged patient to pick up on the way home as she ran out yesterday. -Blood pressures elevated here (markedly) but asx and reportedly normal at home ([id number] systolic). Per dtr, pt may be nervous here. Asked her to document 3-4 blood pressure values this week and call our office for cardiology with these for decision on medications. -Continue low Na diet

## 2014-07-05 DIAGNOSIS — I5031 Acute diastolic (congestive) heart failure: Secondary | ICD-10-CM | POA: Diagnosis not present

## 2014-07-05 DIAGNOSIS — I071 Rheumatic tricuspid insufficiency: Secondary | ICD-10-CM | POA: Diagnosis not present

## 2014-07-05 DIAGNOSIS — I129 Hypertensive chronic kidney disease with stage 1 through stage 4 chronic kidney disease, or unspecified chronic kidney disease: Secondary | ICD-10-CM | POA: Diagnosis not present

## 2014-07-05 DIAGNOSIS — I517 Cardiomegaly: Secondary | ICD-10-CM | POA: Diagnosis not present

## 2014-07-05 DIAGNOSIS — N183 Chronic kidney disease, stage 3 (moderate): Secondary | ICD-10-CM | POA: Diagnosis not present

## 2014-07-05 DIAGNOSIS — I4891 Unspecified atrial fibrillation: Secondary | ICD-10-CM | POA: Diagnosis not present

## 2014-07-07 DIAGNOSIS — N183 Chronic kidney disease, stage 3 (moderate): Secondary | ICD-10-CM | POA: Diagnosis not present

## 2014-07-07 DIAGNOSIS — I129 Hypertensive chronic kidney disease with stage 1 through stage 4 chronic kidney disease, or unspecified chronic kidney disease: Secondary | ICD-10-CM | POA: Diagnosis not present

## 2014-07-07 DIAGNOSIS — I517 Cardiomegaly: Secondary | ICD-10-CM | POA: Diagnosis not present

## 2014-07-07 DIAGNOSIS — I071 Rheumatic tricuspid insufficiency: Secondary | ICD-10-CM | POA: Diagnosis not present

## 2014-07-07 DIAGNOSIS — I5031 Acute diastolic (congestive) heart failure: Secondary | ICD-10-CM | POA: Diagnosis not present

## 2014-07-07 DIAGNOSIS — I4891 Unspecified atrial fibrillation: Secondary | ICD-10-CM | POA: Diagnosis not present

## 2014-07-11 ENCOUNTER — Other Ambulatory Visit: Payer: Self-pay | Admitting: Family Medicine

## 2014-07-11 DIAGNOSIS — I517 Cardiomegaly: Secondary | ICD-10-CM | POA: Diagnosis not present

## 2014-07-11 DIAGNOSIS — N183 Chronic kidney disease, stage 3 (moderate): Secondary | ICD-10-CM | POA: Diagnosis not present

## 2014-07-11 DIAGNOSIS — I129 Hypertensive chronic kidney disease with stage 1 through stage 4 chronic kidney disease, or unspecified chronic kidney disease: Secondary | ICD-10-CM | POA: Diagnosis not present

## 2014-07-11 DIAGNOSIS — I071 Rheumatic tricuspid insufficiency: Secondary | ICD-10-CM | POA: Diagnosis not present

## 2014-07-11 DIAGNOSIS — I4891 Unspecified atrial fibrillation: Secondary | ICD-10-CM | POA: Diagnosis not present

## 2014-07-11 DIAGNOSIS — I5031 Acute diastolic (congestive) heart failure: Secondary | ICD-10-CM | POA: Diagnosis not present

## 2014-07-11 MED ORDER — HYDRALAZINE HCL 50 MG PO TABS: 50.0000 mg | ORAL_TABLET | Freq: Three times a day (TID) | ORAL | Status: DC

## 2014-07-11 NOTE — Telephone Encounter (Signed)
Refill sent to pharmacy. Please advise patient that she needs follow-up for blood pressure in the next 2-3 weeks. Thanks.

## 2014-07-12 NOTE — Telephone Encounter (Signed)
Mailed letter to pt today to schedule BP FU in next 2-3 weeks with PCP. Kadeshia Kasparian, CMA.

## 2014-07-14 ENCOUNTER — Telehealth: Payer: Self-pay | Admitting: Family Medicine

## 2014-07-14 ENCOUNTER — Other Ambulatory Visit: Payer: Self-pay | Admitting: Family Medicine

## 2014-07-14 DIAGNOSIS — I5031 Acute diastolic (congestive) heart failure: Secondary | ICD-10-CM | POA: Diagnosis not present

## 2014-07-14 DIAGNOSIS — I129 Hypertensive chronic kidney disease with stage 1 through stage 4 chronic kidney disease, or unspecified chronic kidney disease: Secondary | ICD-10-CM | POA: Diagnosis not present

## 2014-07-14 DIAGNOSIS — I4891 Unspecified atrial fibrillation: Secondary | ICD-10-CM | POA: Diagnosis not present

## 2014-07-14 DIAGNOSIS — I071 Rheumatic tricuspid insufficiency: Secondary | ICD-10-CM | POA: Diagnosis not present

## 2014-07-14 DIAGNOSIS — N183 Chronic kidney disease, stage 3 (moderate): Secondary | ICD-10-CM | POA: Diagnosis not present

## 2014-07-14 DIAGNOSIS — I517 Cardiomegaly: Secondary | ICD-10-CM | POA: Diagnosis not present

## 2014-07-14 MED ORDER — HYDRALAZINE HCL 50 MG PO TABS: 50.0000 mg | ORAL_TABLET | Freq: Three times a day (TID) | ORAL | Status: DC

## 2014-07-14 NOTE — Telephone Encounter (Signed)
Patient advised as directed below and verbalized understanding. Scheduled lab appt for BMET on 07/19/14 @8 :30a. Jodey Burbano, CMA.

## 2014-07-14 NOTE — Telephone Encounter (Signed)
Will forward to PCP for review. Cristyn Crossno, CMA. 

## 2014-07-14 NOTE — Telephone Encounter (Signed)
Calling for patient, checking status of rx for hydralazine, pharmacy never received refill

## 2014-07-14 NOTE — Telephone Encounter (Signed)
On med list, it shows pt should be taking potassium She is not taking any Please advise

## 2014-07-14 NOTE — Telephone Encounter (Signed)
Patient was supposed to come in for BMET to determine if potassium needed to be continued. She appears to have had an appointment on May 13th and I was under the impression that she would have a lab appointment that day as well, though it appears that there was no lab appointment and a BMET was not collected at that time. Her potassium was in the normal range on last check so she may not need the supplement, though she is on lasix and could potentially require supplement. That is why I wanted her to return for a lab appointment. There is already a future order for a BMET. Please call the patient and advise that she needs a BMET to be checked and schedule for as soon as possible. Hydralazine also sent to the pharmacy. Thanks.

## 2014-07-15 DIAGNOSIS — N183 Chronic kidney disease, stage 3 (moderate): Secondary | ICD-10-CM | POA: Diagnosis not present

## 2014-07-15 DIAGNOSIS — I4891 Unspecified atrial fibrillation: Secondary | ICD-10-CM | POA: Diagnosis not present

## 2014-07-15 DIAGNOSIS — I5031 Acute diastolic (congestive) heart failure: Secondary | ICD-10-CM | POA: Diagnosis not present

## 2014-07-15 DIAGNOSIS — I071 Rheumatic tricuspid insufficiency: Secondary | ICD-10-CM | POA: Diagnosis not present

## 2014-07-15 DIAGNOSIS — I517 Cardiomegaly: Secondary | ICD-10-CM | POA: Diagnosis not present

## 2014-07-15 DIAGNOSIS — I129 Hypertensive chronic kidney disease with stage 1 through stage 4 chronic kidney disease, or unspecified chronic kidney disease: Secondary | ICD-10-CM | POA: Diagnosis not present

## 2014-07-18 DIAGNOSIS — I129 Hypertensive chronic kidney disease with stage 1 through stage 4 chronic kidney disease, or unspecified chronic kidney disease: Secondary | ICD-10-CM | POA: Diagnosis not present

## 2014-07-18 DIAGNOSIS — I071 Rheumatic tricuspid insufficiency: Secondary | ICD-10-CM | POA: Diagnosis not present

## 2014-07-18 DIAGNOSIS — N183 Chronic kidney disease, stage 3 (moderate): Secondary | ICD-10-CM | POA: Diagnosis not present

## 2014-07-18 DIAGNOSIS — I4891 Unspecified atrial fibrillation: Secondary | ICD-10-CM | POA: Diagnosis not present

## 2014-07-18 DIAGNOSIS — I5031 Acute diastolic (congestive) heart failure: Secondary | ICD-10-CM | POA: Diagnosis not present

## 2014-07-18 DIAGNOSIS — I517 Cardiomegaly: Secondary | ICD-10-CM | POA: Diagnosis not present

## 2014-07-19 ENCOUNTER — Other Ambulatory Visit: Payer: Medicare Other

## 2014-07-19 DIAGNOSIS — E876 Hypokalemia: Secondary | ICD-10-CM | POA: Diagnosis not present

## 2014-07-19 LAB — BASIC METABOLIC PANEL
BUN: 57 mg/dL — AB (ref 6–23)
CO2: 22 mEq/L (ref 19–32)
Calcium: 9.3 mg/dL (ref 8.4–10.5)
Chloride: 105 mEq/L (ref 96–112)
Creat: 1.28 mg/dL — ABNORMAL HIGH (ref 0.50–1.10)
Glucose, Bld: 97 mg/dL (ref 70–99)
Potassium: 4.2 mEq/L (ref 3.5–5.3)
SODIUM: 137 meq/L (ref 135–145)

## 2014-07-19 NOTE — Progress Notes (Signed)
BMP DONE TODAY Doha Boling 

## 2014-07-20 DIAGNOSIS — I5031 Acute diastolic (congestive) heart failure: Secondary | ICD-10-CM | POA: Diagnosis not present

## 2014-07-20 DIAGNOSIS — I517 Cardiomegaly: Secondary | ICD-10-CM | POA: Diagnosis not present

## 2014-07-20 DIAGNOSIS — I129 Hypertensive chronic kidney disease with stage 1 through stage 4 chronic kidney disease, or unspecified chronic kidney disease: Secondary | ICD-10-CM | POA: Diagnosis not present

## 2014-07-20 DIAGNOSIS — I4891 Unspecified atrial fibrillation: Secondary | ICD-10-CM | POA: Diagnosis not present

## 2014-07-20 DIAGNOSIS — I071 Rheumatic tricuspid insufficiency: Secondary | ICD-10-CM | POA: Diagnosis not present

## 2014-07-20 DIAGNOSIS — N183 Chronic kidney disease, stage 3 (moderate): Secondary | ICD-10-CM | POA: Diagnosis not present

## 2014-07-21 ENCOUNTER — Ambulatory Visit: Payer: Medicare Other | Admitting: Cardiology

## 2014-07-21 ENCOUNTER — Telehealth: Payer: Self-pay | Admitting: Family Medicine

## 2014-07-21 NOTE — Telephone Encounter (Signed)
Called patient to discuss lab work. Advised of normal potassium. Noted Cr elevated from most recent check. On review of medications the patient has recently been started on lasix for diastolic CHF. She has not been on any new medications otherwise. No NSIADs. Notes BP this morning on [facility] nurse check was [id number] systolic. Notes no swelling or dyspnea. Given Cr increase and only recent change is lasix addition will cut this dose in half in light of no edema. Will have patient follow-up next week for provider appointment to follow-up on BP and for labs.

## 2014-07-22 DIAGNOSIS — I129 Hypertensive chronic kidney disease with stage 1 through stage 4 chronic kidney disease, or unspecified chronic kidney disease: Secondary | ICD-10-CM | POA: Diagnosis not present

## 2014-07-22 DIAGNOSIS — I071 Rheumatic tricuspid insufficiency: Secondary | ICD-10-CM | POA: Diagnosis not present

## 2014-07-22 DIAGNOSIS — N183 Chronic kidney disease, stage 3 (moderate): Secondary | ICD-10-CM | POA: Diagnosis not present

## 2014-07-22 DIAGNOSIS — I4891 Unspecified atrial fibrillation: Secondary | ICD-10-CM | POA: Diagnosis not present

## 2014-07-22 DIAGNOSIS — I517 Cardiomegaly: Secondary | ICD-10-CM | POA: Diagnosis not present

## 2014-07-22 DIAGNOSIS — I5031 Acute diastolic (congestive) heart failure: Secondary | ICD-10-CM | POA: Diagnosis not present

## 2014-07-25 ENCOUNTER — Other Ambulatory Visit: Payer: Self-pay | Admitting: Family Medicine

## 2014-07-25 NOTE — Telephone Encounter (Signed)
Refill given. Patient needs a follow-up appointment this week with any provider for her blood pressure. Please call and inform patient. Thanks.

## 2014-07-25 NOTE — Telephone Encounter (Signed)
Spoke with patient and informed her that she needs an appt.  Patient will wait to schedule since she needs to speak with her granddaughter who is out of town regarding transportation. Wyndi Northrup,CMA

## 2014-07-27 DIAGNOSIS — I129 Hypertensive chronic kidney disease with stage 1 through stage 4 chronic kidney disease, or unspecified chronic kidney disease: Secondary | ICD-10-CM | POA: Diagnosis not present

## 2014-07-27 DIAGNOSIS — I4891 Unspecified atrial fibrillation: Secondary | ICD-10-CM | POA: Diagnosis not present

## 2014-07-27 DIAGNOSIS — I071 Rheumatic tricuspid insufficiency: Secondary | ICD-10-CM | POA: Diagnosis not present

## 2014-07-27 DIAGNOSIS — I5031 Acute diastolic (congestive) heart failure: Secondary | ICD-10-CM | POA: Diagnosis not present

## 2014-07-27 DIAGNOSIS — I517 Cardiomegaly: Secondary | ICD-10-CM | POA: Diagnosis not present

## 2014-07-27 DIAGNOSIS — N183 Chronic kidney disease, stage 3 (moderate): Secondary | ICD-10-CM | POA: Diagnosis not present

## 2014-07-28 DIAGNOSIS — I5031 Acute diastolic (congestive) heart failure: Secondary | ICD-10-CM | POA: Diagnosis not present

## 2014-07-28 DIAGNOSIS — N183 Chronic kidney disease, stage 3 (moderate): Secondary | ICD-10-CM | POA: Diagnosis not present

## 2014-07-28 DIAGNOSIS — I071 Rheumatic tricuspid insufficiency: Secondary | ICD-10-CM | POA: Diagnosis not present

## 2014-07-28 DIAGNOSIS — I129 Hypertensive chronic kidney disease with stage 1 through stage 4 chronic kidney disease, or unspecified chronic kidney disease: Secondary | ICD-10-CM | POA: Diagnosis not present

## 2014-07-28 DIAGNOSIS — I4891 Unspecified atrial fibrillation: Secondary | ICD-10-CM | POA: Diagnosis not present

## 2014-07-28 DIAGNOSIS — I517 Cardiomegaly: Secondary | ICD-10-CM | POA: Diagnosis not present

## 2014-07-29 DIAGNOSIS — I071 Rheumatic tricuspid insufficiency: Secondary | ICD-10-CM | POA: Diagnosis not present

## 2014-07-29 DIAGNOSIS — N183 Chronic kidney disease, stage 3 (moderate): Secondary | ICD-10-CM | POA: Diagnosis not present

## 2014-07-29 DIAGNOSIS — I129 Hypertensive chronic kidney disease with stage 1 through stage 4 chronic kidney disease, or unspecified chronic kidney disease: Secondary | ICD-10-CM | POA: Diagnosis not present

## 2014-07-29 DIAGNOSIS — I4891 Unspecified atrial fibrillation: Secondary | ICD-10-CM | POA: Diagnosis not present

## 2014-07-29 DIAGNOSIS — I517 Cardiomegaly: Secondary | ICD-10-CM | POA: Diagnosis not present

## 2014-07-29 DIAGNOSIS — I5031 Acute diastolic (congestive) heart failure: Secondary | ICD-10-CM | POA: Diagnosis not present

## 2014-08-03 ENCOUNTER — Ambulatory Visit (INDEPENDENT_AMBULATORY_CARE_PROVIDER_SITE_OTHER): Payer: Medicare Other | Admitting: *Deleted

## 2014-08-03 DIAGNOSIS — I071 Rheumatic tricuspid insufficiency: Secondary | ICD-10-CM | POA: Diagnosis not present

## 2014-08-03 DIAGNOSIS — Z5181 Encounter for therapeutic drug level monitoring: Secondary | ICD-10-CM | POA: Diagnosis not present

## 2014-08-03 DIAGNOSIS — N183 Chronic kidney disease, stage 3 (moderate): Secondary | ICD-10-CM | POA: Diagnosis not present

## 2014-08-03 DIAGNOSIS — I517 Cardiomegaly: Secondary | ICD-10-CM | POA: Diagnosis not present

## 2014-08-03 DIAGNOSIS — I482 Chronic atrial fibrillation: Secondary | ICD-10-CM

## 2014-08-03 DIAGNOSIS — I4821 Permanent atrial fibrillation: Secondary | ICD-10-CM

## 2014-08-03 DIAGNOSIS — I4891 Unspecified atrial fibrillation: Secondary | ICD-10-CM | POA: Diagnosis not present

## 2014-08-03 DIAGNOSIS — I5031 Acute diastolic (congestive) heart failure: Secondary | ICD-10-CM | POA: Diagnosis not present

## 2014-08-03 DIAGNOSIS — I129 Hypertensive chronic kidney disease with stage 1 through stage 4 chronic kidney disease, or unspecified chronic kidney disease: Secondary | ICD-10-CM | POA: Diagnosis not present

## 2014-08-03 LAB — POCT INR: INR: 4

## 2014-08-17 ENCOUNTER — Ambulatory Visit (INDEPENDENT_AMBULATORY_CARE_PROVIDER_SITE_OTHER): Payer: Medicare Other | Admitting: *Deleted

## 2014-08-17 DIAGNOSIS — I4821 Permanent atrial fibrillation: Secondary | ICD-10-CM

## 2014-08-17 DIAGNOSIS — I482 Chronic atrial fibrillation: Secondary | ICD-10-CM

## 2014-08-17 DIAGNOSIS — I4891 Unspecified atrial fibrillation: Secondary | ICD-10-CM

## 2014-08-17 DIAGNOSIS — Z5181 Encounter for therapeutic drug level monitoring: Secondary | ICD-10-CM

## 2014-08-17 LAB — POCT INR: INR: 2.5

## 2014-08-24 ENCOUNTER — Other Ambulatory Visit: Payer: Self-pay | Admitting: Family Medicine

## 2014-09-05 ENCOUNTER — Other Ambulatory Visit: Payer: Self-pay | Admitting: *Deleted

## 2014-09-05 MED ORDER — AMLODIPINE BESYLATE 5 MG PO TABS: 5.0000 mg | ORAL_TABLET | Freq: Every day | ORAL | Status: DC

## 2014-09-07 ENCOUNTER — Ambulatory Visit (INDEPENDENT_AMBULATORY_CARE_PROVIDER_SITE_OTHER): Payer: Medicare Other | Admitting: *Deleted

## 2014-09-07 DIAGNOSIS — I482 Chronic atrial fibrillation: Secondary | ICD-10-CM | POA: Diagnosis not present

## 2014-09-07 DIAGNOSIS — I4891 Unspecified atrial fibrillation: Secondary | ICD-10-CM

## 2014-09-07 DIAGNOSIS — I4821 Permanent atrial fibrillation: Secondary | ICD-10-CM

## 2014-09-07 DIAGNOSIS — Z5181 Encounter for therapeutic drug level monitoring: Secondary | ICD-10-CM

## 2014-09-07 LAB — POCT INR: INR: 2.1

## 2014-09-20 ENCOUNTER — Ambulatory Visit (INDEPENDENT_AMBULATORY_CARE_PROVIDER_SITE_OTHER): Payer: Medicare Other | Admitting: *Deleted

## 2014-09-20 VITALS — BP 172/80 | HR 78

## 2014-09-20 DIAGNOSIS — Z0130 Encounter for examination of blood pressure without abnormal findings: Secondary | ICD-10-CM

## 2014-09-20 DIAGNOSIS — Z136 Encounter for screening for cardiovascular disorders: Secondary | ICD-10-CM

## 2014-09-20 DIAGNOSIS — I10 Essential (primary) hypertension: Secondary | ICD-10-CM

## 2014-09-20 NOTE — Progress Notes (Signed)
   Pt in nurse clinic for blood pressure check.  BP 170/80 manually and recheck 172/80 manually.  Pt denies any chest pain, SOB or headache today.  Pt has an appointment 10/03/14 with cardiologist.  Pt stated that blood pressure is always high at the doctor's office, but when the home health nurse take it is okay.  Will forward to PCP.  Derl Barrow, RN

## 2014-09-23 ENCOUNTER — Other Ambulatory Visit: Payer: Self-pay | Admitting: Cardiology

## 2014-10-02 NOTE — Progress Notes (Signed)
HPI: Jennifer Hinton and hypertension; also with chronic disastolic HF, pulmonary HTN and tricuspid regurgitation. Echo 4/16 showed normal LVF with EF of 60%; moderate to severe LVH; mild AI, moderate MR, severe LAE, severe RAE, severe TR, severely elevated pulmonary pressure and small pericardial effusion. Patient with severe hypertension. Since last seen, she denies dyspnea, chest pain, palpitations, syncope or bleeding.   Current Outpatient Prescriptions  Medication Sig Dispense Refill  . acetaminophen (TYLENOL) 650 MG CR tablet One tab by mouth q4-6h as needed pain in knee     . amLODipine (NORVASC) 5 MG tablet Take 1 tablet (5 mg total) by mouth daily. 30 tablet 6  . aspirin 81 MG tablet Take 81 mg by mouth daily.      . carvedilol (COREG) 25 MG tablet Take 1 tablet (25 mg total) by mouth 2 (two) times daily. 60 tablet 6  . furosemide (LASIX) 40 MG tablet Take 0.5 tablets (20 mg total) by mouth daily. 30 tablet 1  . furosemide (LASIX) 40 MG tablet TAKE 1 TABLET EVERY DAY 30 tablet 2  . hydrALAZINE (APRESOLINE) 50 MG tablet Take 1 tablet (50 mg total) by mouth 3 (three) times daily. 90 tablet 11  . potassium chloride SA (K-DUR,KLOR-CON) 20 MEQ tablet Take 1 tablet (20 mEq total) by mouth daily. 15 tablet 0  . warfarin (COUMADIN) 5 MG tablet TAKE 1 TABLET BY MOUTH DAILY OR AS DIRECTED BY COUMADIN CLINIC 90 tablet 0   No current facility-administered medications for this visit.     Past Medical History  Diagnosis Date  . Atrial Hinton   . Hyperlipidemia   . Hypertension   . CHF (congestive heart failure)   . Dysrhythmia     A FIB   . RA (rheumatoid arthritis)     Past Surgical History  Procedure Laterality Date  . No past surgeries      Social History   Social History  . Marital Status: Widowed    Spouse Name: N/A  . Number of Children: 6  . Years of Education: 9th grade    Occupational History  . 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 Smoking status: Never Smoker   . Smokeless tobacco: Never Used  . Alcohol Use: No  . Drug Use: No  . Sexual Activity: Not on file   Other Topics Concern  . Not on file   Social History Narrative   Had 6 children.   5 children living (oldest daughter passed away).    2 living in [place], 2 living in [place], 1 living in [place].       Raising your 60 yo great grandson Dorris Fetch, also a clinic patient).    Drives. Recently renewed license. Does not own a car so transportation is sometimes a limiting factor.              ROS: no fevers or chills, productive cough, hemoptysis, dysphasia, odynophagia, melena, hematochezia, dysuria, hematuria, rash, seizure activity, orthopnea, PND, pedal edema, claudication. Remaining systems are negative.  Physical Exam: Well-developed well-nourished in no acute distress.  Skin is warm and dry.  HEENT is normal.  Neck is supple.  Chest is clear to auscultation with normal expansion.  Cardiovascular exam is irregular, 2/6 systolic murmur apex. Abdominal exam nontender or distended. No masses palpated. Extremities show no edema. neuro grossly intact

## 2014-10-03 ENCOUNTER — Encounter: Payer: Self-pay | Admitting: Cardiology

## 2014-10-03 ENCOUNTER — Ambulatory Visit: Payer: Medicare Other | Admitting: Pharmacist Clinician (PhC)/ Clinical Pharmacy Specialist

## 2014-10-03 ENCOUNTER — Ambulatory Visit (INDEPENDENT_AMBULATORY_CARE_PROVIDER_SITE_OTHER): Payer: Medicare Other | Admitting: Cardiology

## 2014-10-03 ENCOUNTER — Encounter: Payer: Self-pay | Admitting: *Deleted

## 2014-10-03 ENCOUNTER — Ambulatory Visit (INDEPENDENT_AMBULATORY_CARE_PROVIDER_SITE_OTHER): Payer: Medicare Other | Admitting: Pharmacist Clinician (PhC)/ Clinical Pharmacy Specialist

## 2014-10-03 VITALS — BP 180/70 | HR 87 | Ht 65.0 in | Wt 144.4 lb

## 2014-10-03 DIAGNOSIS — I482 Chronic atrial fibrillation: Secondary | ICD-10-CM | POA: Diagnosis not present

## 2014-10-03 DIAGNOSIS — I071 Rheumatic tricuspid insufficiency: Secondary | ICD-10-CM | POA: Diagnosis not present

## 2014-10-03 DIAGNOSIS — Z5181 Encounter for therapeutic drug level monitoring: Secondary | ICD-10-CM

## 2014-10-03 DIAGNOSIS — I5032 Chronic diastolic (congestive) heart failure: Secondary | ICD-10-CM

## 2014-10-03 DIAGNOSIS — I10 Essential (primary) hypertension: Secondary | ICD-10-CM | POA: Diagnosis not present

## 2014-10-03 DIAGNOSIS — I4821 Permanent atrial fibrillation: Secondary | ICD-10-CM

## 2014-10-03 DIAGNOSIS — I4891 Unspecified atrial fibrillation: Secondary | ICD-10-CM

## 2014-10-03 LAB — POCT INR: INR: 2.4

## 2014-10-03 LAB — BASIC METABOLIC PANEL
BUN: 40 mg/dL — ABNORMAL HIGH (ref 7–25)
CALCIUM: 9.3 mg/dL (ref 8.6–10.4)
CO2: 19 mmol/L — ABNORMAL LOW (ref 20–31)
CREATININE: 1.28 mg/dL — AB (ref 0.60–0.93)
Chloride: 109 mmol/L (ref 98–110)
Glucose, Bld: 125 mg/dL — ABNORMAL HIGH (ref 65–99)
Potassium: 4.3 mmol/L (ref 3.5–5.3)
Sodium: 142 mmol/L (ref 135–146)

## 2014-10-03 MED ORDER — AMLODIPINE BESYLATE 10 MG PO TABS: 10.0000 mg | ORAL_TABLET | Freq: Every day | ORAL | Status: DC

## 2014-10-03 NOTE — Assessment & Plan Note (Signed)
Blood pressure remains elevated. Increase amlodipine to 10 mg daily. Further adjustments based on follow-up readings.

## 2014-10-03 NOTE — Patient Instructions (Signed)
Your physician wants you to follow-up in: [place] will receive a reminder letter in the mail two months in advance. If you don't receive a letter, please call our office to schedule the follow-up appointment.   STOP ASPIRIN  INCREASE AMLODIPINE TO 10 MG ONCE DAILY= 2 OF THE 5 MG TABLETS ONCE DAILY  Your physician recommends that you HAVE LAB WORK TODAY

## 2014-10-03 NOTE — Assessment & Plan Note (Signed)
Continue beta blocker. Rate is controlled. Continue Coumadin. Given need for anticoagulation I will discontinue aspirin. 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 This is typically not necessary.

## 2014-10-03 NOTE — Assessment & Plan Note (Signed)
Patient will need follow-up echocardiogram for tricuspid regurgitation and mitral regurgitation in the future.

## 2014-10-03 NOTE — Assessment & Plan Note (Signed)
euvolemic on examination.continue present dose of Lasix. Check potassium and renal function.

## 2014-10-04 ENCOUNTER — Telehealth: Payer: Self-pay | Admitting: *Deleted

## 2014-10-04 DIAGNOSIS — Z79899 Other long term (current) drug therapy: Secondary | ICD-10-CM

## 2014-10-04 NOTE — Telephone Encounter (Signed)
Patient notified of BMET results and med changes/lab instructions. Repeat BMET ordered. Med list updated.

## 2014-11-02 ENCOUNTER — Ambulatory Visit (INDEPENDENT_AMBULATORY_CARE_PROVIDER_SITE_OTHER): Payer: Medicare Other | Admitting: *Deleted

## 2014-11-02 DIAGNOSIS — I482 Chronic atrial fibrillation: Secondary | ICD-10-CM

## 2014-11-02 DIAGNOSIS — I4891 Unspecified atrial fibrillation: Secondary | ICD-10-CM

## 2014-11-02 DIAGNOSIS — Z5181 Encounter for therapeutic drug level monitoring: Secondary | ICD-10-CM | POA: Diagnosis not present

## 2014-11-02 DIAGNOSIS — I4821 Permanent atrial fibrillation: Secondary | ICD-10-CM

## 2014-11-02 LAB — POCT INR: INR: 2.4

## 2014-11-17 ENCOUNTER — Encounter: Payer: Self-pay | Admitting: *Deleted

## 2014-12-08 ENCOUNTER — Other Ambulatory Visit: Payer: Self-pay | Admitting: *Deleted

## 2014-12-08 MED ORDER — WARFARIN SODIUM 5 MG PO TABS: ORAL_TABLET | ORAL | Status: DC

## 2014-12-19 ENCOUNTER — Ambulatory Visit (INDEPENDENT_AMBULATORY_CARE_PROVIDER_SITE_OTHER): Payer: Medicare Other | Admitting: Family Medicine

## 2014-12-19 ENCOUNTER — Encounter: Payer: Self-pay | Admitting: Family Medicine

## 2014-12-19 VITALS — BP 131/85 | HR 89 | Temp 97.8°F | Ht 65.0 in | Wt 137.4 lb

## 2014-12-19 DIAGNOSIS — M2500 Hemarthrosis, unspecified joint: Secondary | ICD-10-CM | POA: Diagnosis present

## 2014-12-19 DIAGNOSIS — I10 Essential (primary) hypertension: Secondary | ICD-10-CM

## 2014-12-19 DIAGNOSIS — I5032 Chronic diastolic (congestive) heart failure: Secondary | ICD-10-CM | POA: Diagnosis not present

## 2014-12-19 DIAGNOSIS — Z23 Encounter for immunization: Secondary | ICD-10-CM | POA: Diagnosis present

## 2014-12-19 DIAGNOSIS — M25562 Pain in left knee: Secondary | ICD-10-CM | POA: Diagnosis not present

## 2014-12-19 LAB — POCT INR: INR: 3.6

## 2014-12-19 MED ORDER — PREDNISONE 50 MG PO TABS: 50.0000 mg | ORAL_TABLET | Freq: Every day | ORAL | Status: DC

## 2014-12-19 NOTE — Assessment & Plan Note (Signed)
Well controlled today. Continue lasix and coreg.

## 2014-12-19 NOTE — Assessment & Plan Note (Signed)
Patient with grossly bloody arthrocentesis of left knee, consistent with hemarthrosis. Patient's INR in clinic today was 3.6, which likely explain's patient spontaneous bleed. Instructed patient to hold her next dose of warfarin and follow up with her cardiologist.   Patient also likely has underlying OA given significant crepitus on exam. Will obtain plain films for confirmation. Patient also has history of gout, and her knee pain may be partially due to a gout flare. Will obtain a uric acid level today in addition to sending synovial fluid for crystal analysis. Doubt septic arthritis given non-purulent appearance of synovial fluid, and lack of fevers and chills.  Will treat symptomatically with 7 day course of prednisone. Will avoid NSAIDs given CKD and risk of bleeding.   If patient continues to have hemarthrosis despite therapeutic INR, will need to consider MRI to rule out pigmented villonodular synovitis.

## 2014-12-19 NOTE — Progress Notes (Signed)
Subjective:  Jennifer Hinton is a 79 y.o. female who presents to the [facility] today with a chief complaint of left knee pain.   HPI:  Left Knee Pain. Patient reports that she has had increased left knee pain for about the past week. Has a long standing history of bilateral knee pain, but states that this is much worse and different than her previous pain. Additionally reports that she has noticed increased swelling for the past week as well. No recent trauma or precipitating events. Pain is described as sharp and located on the medial aspect of her left knee. No fevers or chills. States that current pain feels similar to gout flares. Has tried tylenol without significant relief. Has not noticed anything else that makes the pain better or worse.   Hypertension BP Readings from Last 3 Encounters:  12/19/14 131/85  10/03/14 180/70  09/20/14 172/80   Home BP monitoring-No Compliant with medications-yes without side effects ROS-Denies any CP, HA, SOB, blurry vision, LE edema, transient weakness, orthopnea, PND.   CHF Has been tolerating lasix and coreg with no side effects. No orthopnea or PND. Stable lower extremity  ROS: Per HPI  PMH:  The following were reviewed and entered/updated in epic: Past Medical History  Diagnosis Date  . Atrial fibrillation ([place])   . Hyperlipidemia   . Hypertension   . CHF (congestive heart failure) ([place])   . Dysrhythmia     A FIB   . RA (rheumatoid arthritis) [facility])    Patient Active Problem List   Diagnosis Date Noted  . Hemarthrosis 12/19/2014  . Chronic diastolic congestive heart failure ([place]) 10/03/2014  . Essential hypertension, benign 06/14/2014  . Anemia 06/14/2014  . Knee pain 06/14/2014  . Hypertensive cardiovascular disease- LVH 06/06/2014  . Pulmonary hypertension-severe 06/06/2014  . Tricuspid regurgitation-severe 06/06/2014  . Chronic renal disease, stage III 06/06/2014  . Chronic anticoagulation 06/06/2014  . Malnutrition of moderate  degree ([place]) 06/06/2014  . Acute on chronic diastolic congestive heart failure ([place])   . Hypertensive urgency 06/04/2014  . Dyspnea 03/09/2014  . Falls 03/09/2014  . Tinnitus 10/05/2012  . Encounter for long-term (current) use of anticoagulants 05/23/2010  . LIPOMA 11/30/2008  . PROTEINURIA 11/30/2008  . GOUT, UNSPECIFIED 06/27/2008  . HYPERTENSION, MALIGNANT ESSENTIAL 12/17/2006  . Dyslipidemia 04/17/2006  . OBESITY, NOS 04/17/2006  . Permanent atrial fibrillation ([place]) 04/17/2006  . Osteoarthrosis, unspecified whether generalized or localized, involving lower leg 04/17/2006   Past Surgical History  Procedure Laterality Date  . No past surgeries       Objective:  Physical Exam: BP 131/85 mmHg  Pulse 89  Temp(Src) 97.8 F (36.6 C) (Oral)  Ht 5\' 5"  (1.651 m)  Wt 137 lb 6 oz (62.313 kg)  BMI 22.86 kg/m2  Gen: NAD, resting comfortably CV: Irregularly irregular Lungs: NWOB, CTAB with no crackles, wheezes, or rhonchi GI: Normal bowel sounds present. Soft, Nontender, Nondistended. MSK:  - Left Knee: Gross effusion noted. Cool to touch. Nonerythematous. Tender to palpation along medial joint line. Significant crepitus noted. Negative anterior drawer. - Right Knee: No effusion noted. Cool to touch. Non-erythematous. Nontender to palpation. Significant crepitus noted.  Skin: warm, dry Neuro: grossly normal, moves all extremities Psych: Normal affect and thought content  Knee Arthrocentesis without Injection Procedure Note  Pre-operative Diagnosis: left knee effusion  Post-operative Diagnosis: hemarthrosis  Indications: Symptomatic relief of large effusion, Unexplained joint effusion and Crystal induced arthropathy  Anesthesia: not required   Procedure Details   Verbal consent  was obtained for the procedure. The joint was prepped with Betadine and a small wheel of anesthetic was injected into the subcutaneous tissue. A 22 gauge needle was inserted intothe joint from a  lateral approach. 8 ml of grossly bloody fluid was removed from the joint and sent to the lab for analysis. The needle was removed and the area cleansed and dressed.  Complications:  None; patient tolerated the procedure well.  Assessment/Plan:  Hemarthrosis Patient with grossly bloody arthrocentesis of left knee, consistent with hemarthrosis. Patient's INR in clinic today was 3.6, which likely explain's patient spontaneous bleed. Instructed patient to hold her next dose of warfarin and follow up with her cardiologist.   Patient also likely has underlying OA given significant crepitus on exam. Will obtain plain films for confirmation. Patient also has history of gout, and her knee pain may be partially due to a gout flare. Will obtain a uric acid level today in addition to sending synovial fluid for crystal analysis. Doubt septic arthritis given non-purulent appearance of synovial fluid, and lack of fevers and chills.  Will treat symptomatically with 7 day course of prednisone. Will avoid NSAIDs given CKD and risk of bleeding.   If patient continues to have hemarthrosis despite therapeutic INR, will need to consider MRI to rule out pigmented villonodular synovitis.   HYPERTENSION, MALIGNANT ESSENTIAL Well controlled. Continue current medications.   Chronic diastolic congestive heart failure Well controlled today. Continue lasix and coreg.     Algis Greenhouse. Jerline Pain, [place] Resident PGY-2 12/19/2014 4:44 PM

## 2014-12-19 NOTE — Assessment & Plan Note (Signed)
Well controlled. Continue current medications  

## 2014-12-19 NOTE — Patient Instructions (Signed)
You had blood in your knee. This can be caused by several things.   We will be checking blood work today. We will also look at the fluid in your knee for signs of gout.  Please start taking the prednisone today. This will help with your pain.   Please come back in 1 week if your symptoms are not getting better or if your symptoms are worsening.   Take care,  Dr Jerline Pain

## 2014-12-20 ENCOUNTER — Encounter: Payer: Self-pay | Admitting: Family Medicine

## 2014-12-20 LAB — URIC ACID: Uric Acid, Serum: 7.7 mg/dL — ABNORMAL HIGH (ref 2.4–7.0)

## 2014-12-20 LAB — SYNOVIAL FLUID, CRYSTAL: CRYSTALS FLUID: NONE SEEN

## 2014-12-23 ENCOUNTER — Other Ambulatory Visit: Payer: Self-pay | Admitting: Family Medicine

## 2014-12-23 ENCOUNTER — Encounter: Payer: Self-pay | Admitting: Family Medicine

## 2014-12-23 ENCOUNTER — Ambulatory Visit (HOSPITAL_COMMUNITY)
Admission: RE | Admit: 2014-12-23 | Discharge: 2014-12-23 | Disposition: A | Discharge status: Home / Self Care | Payer: Medicare Other | Source: Ambulatory Visit | Attending: Family Medicine | Admitting: Family Medicine

## 2014-12-23 DIAGNOSIS — I739 Peripheral vascular disease, unspecified: Secondary | ICD-10-CM | POA: Diagnosis not present

## 2014-12-23 DIAGNOSIS — M25461 Effusion, right knee: Secondary | ICD-10-CM | POA: Insufficient documentation

## 2014-12-23 DIAGNOSIS — M7989 Other specified soft tissue disorders: Secondary | ICD-10-CM | POA: Insufficient documentation

## 2014-12-23 DIAGNOSIS — M2500 Hemarthrosis, unspecified joint: Secondary | ICD-10-CM

## 2014-12-23 DIAGNOSIS — M25562 Pain in left knee: Secondary | ICD-10-CM | POA: Diagnosis not present

## 2014-12-23 DIAGNOSIS — M179 Osteoarthritis of knee, unspecified: Secondary | ICD-10-CM | POA: Diagnosis not present

## 2014-12-26 ENCOUNTER — Other Ambulatory Visit: Payer: Self-pay | Admitting: Family Medicine

## 2014-12-26 NOTE — Telephone Encounter (Signed)
Will not fill rx at this time. If patient is continuing to have knee pain, she needs to be seen in the clinic.  Jennifer Hinton. Jerline Pain, [place] Resident PGY-2 12/26/2014 1:30 PM

## 2014-12-27 ENCOUNTER — Ambulatory Visit (INDEPENDENT_AMBULATORY_CARE_PROVIDER_SITE_OTHER): Payer: Medicare Other | Admitting: *Deleted

## 2014-12-27 DIAGNOSIS — I4891 Unspecified atrial fibrillation: Secondary | ICD-10-CM | POA: Diagnosis not present

## 2014-12-27 DIAGNOSIS — Z5181 Encounter for therapeutic drug level monitoring: Secondary | ICD-10-CM

## 2014-12-27 DIAGNOSIS — I482 Chronic atrial fibrillation: Secondary | ICD-10-CM

## 2014-12-27 DIAGNOSIS — I4821 Permanent atrial fibrillation: Secondary | ICD-10-CM

## 2014-12-27 LAB — POCT INR: INR: 7.1

## 2014-12-27 LAB — PROTIME-INR
INR: 5.58 — AB (ref <1.50)
PROTHROMBIN TIME: 51.5 s — AB (ref 11.6–15.2)

## 2015-01-03 ENCOUNTER — Ambulatory Visit (INDEPENDENT_AMBULATORY_CARE_PROVIDER_SITE_OTHER): Payer: Medicare Other | Admitting: *Deleted

## 2015-01-03 DIAGNOSIS — I4891 Unspecified atrial fibrillation: Secondary | ICD-10-CM

## 2015-01-03 DIAGNOSIS — I482 Chronic atrial fibrillation: Secondary | ICD-10-CM | POA: Diagnosis not present

## 2015-01-03 DIAGNOSIS — Z5181 Encounter for therapeutic drug level monitoring: Secondary | ICD-10-CM | POA: Diagnosis not present

## 2015-01-03 DIAGNOSIS — I4821 Permanent atrial fibrillation: Secondary | ICD-10-CM

## 2015-01-03 LAB — POCT INR: INR: 2.9

## 2015-01-14 ENCOUNTER — Other Ambulatory Visit: Payer: Self-pay | Admitting: Cardiology

## 2015-01-25 ENCOUNTER — Ambulatory Visit (INDEPENDENT_AMBULATORY_CARE_PROVIDER_SITE_OTHER): Payer: Medicare Other | Admitting: *Deleted

## 2015-01-25 DIAGNOSIS — I4891 Unspecified atrial fibrillation: Secondary | ICD-10-CM

## 2015-01-25 DIAGNOSIS — Z5181 Encounter for therapeutic drug level monitoring: Secondary | ICD-10-CM

## 2015-01-25 DIAGNOSIS — I4821 Permanent atrial fibrillation: Secondary | ICD-10-CM

## 2015-01-25 DIAGNOSIS — I482 Chronic atrial fibrillation: Secondary | ICD-10-CM | POA: Diagnosis not present

## 2015-01-25 LAB — POCT INR: INR: 3.6

## 2015-02-08 ENCOUNTER — Ambulatory Visit (INDEPENDENT_AMBULATORY_CARE_PROVIDER_SITE_OTHER): Payer: Medicare Other | Admitting: *Deleted

## 2015-02-08 DIAGNOSIS — I482 Chronic atrial fibrillation: Secondary | ICD-10-CM | POA: Diagnosis not present

## 2015-02-08 DIAGNOSIS — I4891 Unspecified atrial fibrillation: Secondary | ICD-10-CM | POA: Diagnosis not present

## 2015-02-08 DIAGNOSIS — I4821 Permanent atrial fibrillation: Secondary | ICD-10-CM

## 2015-02-08 DIAGNOSIS — Z5181 Encounter for therapeutic drug level monitoring: Secondary | ICD-10-CM | POA: Diagnosis not present

## 2015-02-08 LAB — POCT INR: INR: 2.1

## 2015-03-08 ENCOUNTER — Ambulatory Visit: Payer: Medicare Other | Admitting: Family Medicine

## 2015-03-15 ENCOUNTER — Ambulatory Visit (INDEPENDENT_AMBULATORY_CARE_PROVIDER_SITE_OTHER): Payer: Medicare Other | Admitting: Pharmacist

## 2015-03-15 DIAGNOSIS — Z5181 Encounter for therapeutic drug level monitoring: Secondary | ICD-10-CM | POA: Diagnosis not present

## 2015-03-15 DIAGNOSIS — I4891 Unspecified atrial fibrillation: Secondary | ICD-10-CM | POA: Diagnosis not present

## 2015-03-15 DIAGNOSIS — I482 Chronic atrial fibrillation: Secondary | ICD-10-CM | POA: Diagnosis not present

## 2015-03-15 DIAGNOSIS — I4821 Permanent atrial fibrillation: Secondary | ICD-10-CM

## 2015-03-15 LAB — POCT INR: INR: 1.8

## 2015-03-22 ENCOUNTER — Other Ambulatory Visit: Payer: Self-pay | Admitting: Cardiology

## 2015-04-05 NOTE — Progress Notes (Signed)
      HPI: Fu Atrial fibrillation and hypertension; also with chronic disastolic HF, pulmonary HTN and tricuspid regurgitation. Echo 4/16 showed normal LVF with EF of 60%; moderate to severe LVH; mild AI, moderate MR, severe LAE, severe RAE, severe TR, severely elevated pulmonary pressure and small pericardial effusion. Patient with severe hypertension. Since last seen,   Current Outpatient Prescriptions  Medication Sig Dispense Refill  . acetaminophen (TYLENOL) 650 MG CR tablet One tab by mouth q4-6h as needed pain in knee     . amLODipine (NORVASC) 10 MG tablet Take 1 tablet (10 mg total) by mouth daily. 90 tablet 3  . carvedilol (COREG) 25 MG tablet TAKE 1 TABLET (25 MG TOTAL) BY MOUTH 2 (TWO) TIMES DAILY. 60 tablet 9  . furosemide (LASIX) 40 MG tablet Take 20 mg by mouth every other day.    . hydrALAZINE (APRESOLINE) 50 MG tablet Take 1 tablet (50 mg total) by mouth 3 (three) times daily. 90 tablet 11  . predniSONE (DELTASONE) 50 MG tablet Take 1 tablet (50 mg total) by mouth daily with breakfast. 7 tablet 0  . warfarin (COUMADIN) 5 MG tablet TAKE 1 TABLET BY MOUTH DAILY OR AS DIRECTED BY COUMADIN CLINIC 90 tablet 0   No current facility-administered medications for this visit.     Past Medical History  Diagnosis Date  . Atrial fibrillation ([place])   . Hyperlipidemia   . Hypertension   . CHF (congestive heart failure) ([place])   . Dysrhythmia     A FIB   . RA (rheumatoid arthritis) [facility])     Past Surgical History  Procedure Laterality Date  . No past surgeries      Social History   Social History  . Marital Status: Widowed    Spouse Name: N/A  . Number of Children: 6  . Years of Education: 9th grade    Occupational History  . 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 Smoking status: Never Smoker   . Smokeless tobacco: Never Used  . Alcohol Use: No  . Drug Use: No  . Sexual Activity: Not on file   Other  Topics Concern  . Not on file   Social History Narrative   Had 6 children.   5 children living (oldest daughter passed away).    2 living in [place], 2 living in [place], 1 living in [place].       Raising your 80 yo great grandson Dorris Fetch, also a clinic patient).    Drives. Recently renewed license. Does not own a car so transportation is sometimes a limiting factor.              Family History  Problem Relation Age of Onset  . Heart disease Brother   . Hyperlipidemia Brother     ROS: no fevers or chills, productive cough, hemoptysis, dysphasia, odynophagia, melena, hematochezia, dysuria, hematuria, rash, seizure activity, orthopnea, PND, pedal edema, claudication. Remaining systems are negative.  Physical Exam: Well-developed well-nourished in no acute distress.  Skin is warm and dry.  HEENT is normal.  Neck is supple.  Chest is clear to auscultation with normal expansion.  Cardiovascular exam is regular rate and rhythm.  Abdominal exam nontender or distended. No masses palpated. Extremities show no edema. neuro grossly intact  ECG     This encounter was created in error - please disregard.

## 2015-04-06 ENCOUNTER — Ambulatory Visit (INDEPENDENT_AMBULATORY_CARE_PROVIDER_SITE_OTHER): Payer: Medicare Other | Admitting: *Deleted

## 2015-04-06 DIAGNOSIS — I4891 Unspecified atrial fibrillation: Secondary | ICD-10-CM

## 2015-04-06 DIAGNOSIS — Z5181 Encounter for therapeutic drug level monitoring: Secondary | ICD-10-CM | POA: Diagnosis not present

## 2015-04-06 DIAGNOSIS — I482 Chronic atrial fibrillation: Secondary | ICD-10-CM | POA: Diagnosis not present

## 2015-04-06 DIAGNOSIS — I4821 Permanent atrial fibrillation: Secondary | ICD-10-CM

## 2015-04-06 LAB — POCT INR: INR: 2

## 2015-04-13 ENCOUNTER — Encounter: Payer: Medicare Other | Admitting: Cardiology

## 2015-05-04 NOTE — Progress Notes (Signed)
      HPI: Fu Atrial fibrillation and hypertension; also with chronic disastolic HF, pulmonary HTN and tricuspid regurgitation. Echo 4/16 showed normal LVF with EF of 60%; moderate to severe LVH; mild AI, moderate MR, severe LAE, severe RAE, severe TR, severely elevated pulmonary pressure and small pericardial effusion. Patient with severe hypertension. Since last seen, Patient denies dyspnea, chest pain, palpitations, syncope or bleeding.  Current Outpatient Prescriptions  Medication Sig Dispense Refill  . acetaminophen (TYLENOL) 650 MG CR tablet One tab by mouth q4-6h as needed pain in knee     . amLODipine (NORVASC) 10 MG tablet Take 1 tablet (10 mg total) by mouth daily. 90 tablet 3  . carvedilol (COREG) 25 MG tablet TAKE 1 TABLET (25 MG TOTAL) BY MOUTH 2 (TWO) TIMES DAILY. 60 tablet 9  . hydrALAZINE (APRESOLINE) 50 MG tablet Take 1 tablet (50 mg total) by mouth 3 (three) times daily. 90 tablet 11  . warfarin (COUMADIN) 5 MG tablet TAKE 1 TABLET BY MOUTH DAILY OR AS DIRECTED BY COUMADIN CLINIC 90 tablet 0   No current facility-administered medications for this visit.     Past Medical History  Diagnosis Date  . Atrial fibrillation ([place])   . Hyperlipidemia   . Hypertension   . CHF (congestive heart failure) ([place])   . Dysrhythmia     A FIB   . RA (rheumatoid arthritis) [facility])     Past Surgical History  Procedure Laterality Date  . No past surgeries      Social History   Social History  . Marital Status: Widowed    Spouse Name: N/A  . Number of Children: 6  . Years of Education: 9th grade    Occupational History  . 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 Smoking status: Never Smoker   . Smokeless tobacco: Never Used  . Alcohol Use: No  . Drug Use: No  . Sexual Activity: Not on file   Other Topics Concern  . Not on file   Social History Narrative   Had 6 children.   5 children living (oldest  daughter passed away).    2 living in [place], 2 living in [place], 1 living in [place].       Raising your 53 yo great grandson Dorris Fetch, also a clinic patient).    Drives. Recently renewed license. Does not own a car so transportation is sometimes a limiting factor.              Family History  Problem Relation Age of Onset  . Heart disease Brother   . Hyperlipidemia Brother     ROS: knee arthralgias but no fevers or chills, productive cough, hemoptysis, dysphasia, odynophagia, melena, hematochezia, dysuria, hematuria, rash, seizure activity, orthopnea, PND, pedal edema, claudication. Remaining systems are negative.  Physical Exam: Well-developed frail in no acute distress.  Skin is warm and dry.  HEENT is normal.  Neck is supple.  Chest is clear to auscultation with normal expansion.  Cardiovascular exam is irregular Abdominal exam nontender or distended. No masses palpated. Extremities show no edema. neuro grossly intact  ECG Atrial fibrillation at a rate of 86.Septal infarct. Nonspecific ST changes.

## 2015-05-09 ENCOUNTER — Encounter: Payer: Self-pay | Admitting: *Deleted

## 2015-05-09 ENCOUNTER — Ambulatory Visit (INDEPENDENT_AMBULATORY_CARE_PROVIDER_SITE_OTHER): Payer: Medicare Other | Admitting: Cardiology

## 2015-05-09 ENCOUNTER — Encounter: Payer: Self-pay | Admitting: Cardiology

## 2015-05-09 VITALS — BP 154/70 | HR 86 | Ht 65.0 in | Wt 142.0 lb

## 2015-05-09 DIAGNOSIS — I4891 Unspecified atrial fibrillation: Secondary | ICD-10-CM

## 2015-05-09 DIAGNOSIS — I482 Chronic atrial fibrillation: Secondary | ICD-10-CM

## 2015-05-09 DIAGNOSIS — I5032 Chronic diastolic (congestive) heart failure: Secondary | ICD-10-CM

## 2015-05-09 DIAGNOSIS — I4821 Permanent atrial fibrillation: Secondary | ICD-10-CM

## 2015-05-09 DIAGNOSIS — I10 Essential (primary) hypertension: Secondary | ICD-10-CM

## 2015-05-09 DIAGNOSIS — I071 Rheumatic tricuspid insufficiency: Secondary | ICD-10-CM

## 2015-05-09 MED ORDER — FUROSEMIDE 20 MG PO TABS
20.0000 mg | ORAL_TABLET | Freq: Every day | ORAL | Status: DC
Start: 2015-05-09 — End: 2016-08-03

## 2015-05-09 NOTE — Assessment & Plan Note (Signed)
Mild volume excess on examination. She discontinued her Lasix previously. I will resume lower dose at 20 mg daily. Check potassium and renal function in 1 week.

## 2015-05-09 NOTE — Assessment & Plan Note (Signed)
Continue present medications for rate control. Continue Coumadin. Check hemoglobin.

## 2015-05-09 NOTE — Assessment & Plan Note (Signed)
We will plan follow-up echocardiogram in 1 year for her tricuspid regurgitation and mitral regurgitation.

## 2015-05-09 NOTE — Assessment & Plan Note (Signed)
Blood pressure elevated. Add Lasix 20 mg daily both for diastolic congestive heart failure and hypertension.

## 2015-05-09 NOTE — Patient Instructions (Signed)
Medication Instructions:   START FUROSEMIDE 20 MG ONCE DAILY  Labwork:  Your physician recommends that you return for lab work in: [place]:  Your physician wants you to follow-up in: [place] will receive a reminder letter in the mail two months in advance. If you don't receive a letter, please call our office to schedule the follow-up appointment.   If you need a refill on your cardiac medications before your next appointment, please call your pharmacy.

## 2015-05-10 ENCOUNTER — Ambulatory Visit (INDEPENDENT_AMBULATORY_CARE_PROVIDER_SITE_OTHER): Payer: Medicare Other | Admitting: *Deleted

## 2015-05-10 DIAGNOSIS — Z5181 Encounter for therapeutic drug level monitoring: Secondary | ICD-10-CM

## 2015-05-10 DIAGNOSIS — I482 Chronic atrial fibrillation: Secondary | ICD-10-CM | POA: Diagnosis not present

## 2015-05-10 DIAGNOSIS — I4821 Permanent atrial fibrillation: Secondary | ICD-10-CM

## 2015-05-10 DIAGNOSIS — I4891 Unspecified atrial fibrillation: Secondary | ICD-10-CM

## 2015-05-10 LAB — POCT INR: INR: 1.7

## 2015-05-31 ENCOUNTER — Ambulatory Visit (INDEPENDENT_AMBULATORY_CARE_PROVIDER_SITE_OTHER): Payer: Medicare Other | Admitting: *Deleted

## 2015-05-31 DIAGNOSIS — I4821 Permanent atrial fibrillation: Secondary | ICD-10-CM

## 2015-05-31 DIAGNOSIS — I4891 Unspecified atrial fibrillation: Secondary | ICD-10-CM | POA: Diagnosis not present

## 2015-05-31 DIAGNOSIS — I482 Chronic atrial fibrillation: Secondary | ICD-10-CM | POA: Diagnosis not present

## 2015-05-31 DIAGNOSIS — Z5181 Encounter for therapeutic drug level monitoring: Secondary | ICD-10-CM | POA: Diagnosis not present

## 2015-05-31 LAB — POCT INR: INR: 2.4

## 2015-06-22 ENCOUNTER — Other Ambulatory Visit: Payer: Self-pay | Admitting: Cardiology

## 2015-06-28 ENCOUNTER — Ambulatory Visit (INDEPENDENT_AMBULATORY_CARE_PROVIDER_SITE_OTHER): Payer: Medicare Other | Admitting: *Deleted

## 2015-06-28 DIAGNOSIS — I482 Chronic atrial fibrillation: Secondary | ICD-10-CM | POA: Diagnosis not present

## 2015-06-28 DIAGNOSIS — I4821 Permanent atrial fibrillation: Secondary | ICD-10-CM

## 2015-06-28 DIAGNOSIS — Z5181 Encounter for therapeutic drug level monitoring: Secondary | ICD-10-CM | POA: Diagnosis not present

## 2015-06-28 DIAGNOSIS — I4891 Unspecified atrial fibrillation: Secondary | ICD-10-CM

## 2015-06-28 LAB — POCT INR: INR: 2.3

## 2015-07-26 ENCOUNTER — Ambulatory Visit (INDEPENDENT_AMBULATORY_CARE_PROVIDER_SITE_OTHER): Payer: Medicare Other | Admitting: *Deleted

## 2015-07-26 DIAGNOSIS — I482 Chronic atrial fibrillation: Secondary | ICD-10-CM

## 2015-07-26 DIAGNOSIS — I4891 Unspecified atrial fibrillation: Secondary | ICD-10-CM

## 2015-07-26 DIAGNOSIS — Z5181 Encounter for therapeutic drug level monitoring: Secondary | ICD-10-CM | POA: Diagnosis not present

## 2015-07-26 DIAGNOSIS — I4821 Permanent atrial fibrillation: Secondary | ICD-10-CM

## 2015-07-26 LAB — POCT INR: INR: 2.3

## 2015-07-31 ENCOUNTER — Other Ambulatory Visit: Payer: Self-pay | Admitting: *Deleted

## 2015-07-31 MED ORDER — HYDRALAZINE HCL 50 MG PO TABS: 50.0000 mg | ORAL_TABLET | Freq: Three times a day (TID) | ORAL | Status: DC

## 2015-07-31 NOTE — Telephone Encounter (Signed)
I called pt to inform her her rx was refilled and that it was time for her to come in for an apt. I offered to make her one while i was on the phone, pt stated she needed to look at her calendar bc she needs a ride here. Pt instructed to call us back and make an apt when she has a ride here. Page, cma.

## 2015-07-31 NOTE — Telephone Encounter (Signed)
Rx filled. Patient needs appointment for refills.  Algis Greenhouse. Jerline Pain, [place] Resident PGY-2 07/31/2015 10:19 AM

## 2015-09-06 ENCOUNTER — Ambulatory Visit (INDEPENDENT_AMBULATORY_CARE_PROVIDER_SITE_OTHER): Payer: Medicare Other | Admitting: *Deleted

## 2015-09-06 DIAGNOSIS — I4891 Unspecified atrial fibrillation: Secondary | ICD-10-CM | POA: Diagnosis not present

## 2015-09-06 DIAGNOSIS — I482 Chronic atrial fibrillation: Secondary | ICD-10-CM | POA: Diagnosis not present

## 2015-09-06 DIAGNOSIS — I4821 Permanent atrial fibrillation: Secondary | ICD-10-CM

## 2015-09-06 DIAGNOSIS — Z5181 Encounter for therapeutic drug level monitoring: Secondary | ICD-10-CM

## 2015-09-06 LAB — POCT INR: INR: 2.6

## 2015-09-12 ENCOUNTER — Ambulatory Visit: Payer: Medicare Other | Admitting: Family Medicine

## 2015-09-19 IMAGING — CR DG CHEST 1V PORT
1 series · 1 of 1 positions shown · non-contrast
Comparison: None.

CLINICAL DATA: Weakness and pain in both legs. History of
congestive heart failure.

EXAM:
PORTABLE CHEST - 1 VIEW

[AP]
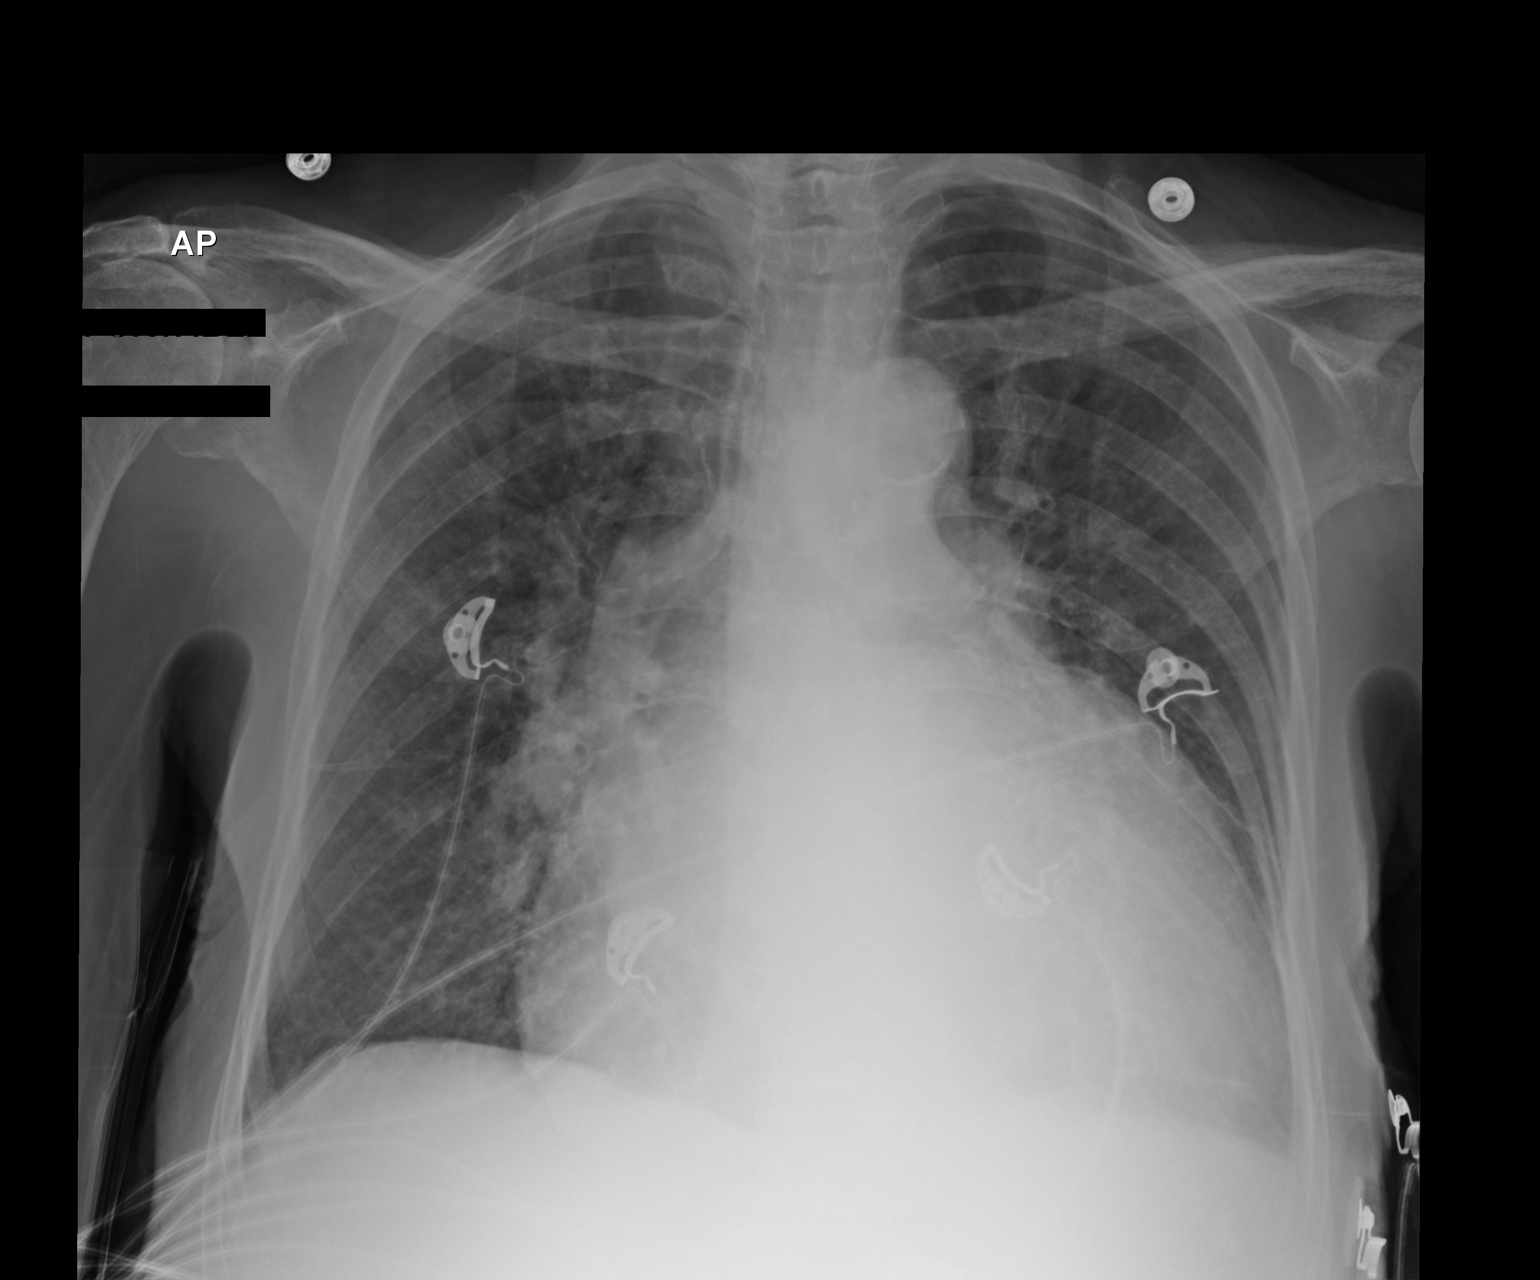

[1 of 1 positions shown; findings below may reference images not displayed]

FINDINGS: There is moderate enlargement of the cardiopericardial silhouette.
No mediastinal or hilar masses or convincing adenopathy.

Lungs are mildly hyperexpanded. There is no lung consolidation or
edema. No pleural effusion or pneumothorax.

Bony thorax is demineralized but grossly intact.
IMPRESSION: Significant enlargement of the cardiopericardial silhouette. This
may all be cardiomegaly. Consider pericardial effusion in the proper
clinical setting.

No evidence of pneumonia or pulmonary edema.

## 2015-09-20 ENCOUNTER — Ambulatory Visit: Payer: Medicare Other | Admitting: Family Medicine

## 2015-10-06 ENCOUNTER — Ambulatory Visit: Payer: Medicare Other | Admitting: Family Medicine

## 2015-10-11 ENCOUNTER — Other Ambulatory Visit: Payer: Self-pay | Admitting: Cardiology

## 2015-10-11 DIAGNOSIS — I10 Essential (primary) hypertension: Secondary | ICD-10-CM

## 2015-10-17 ENCOUNTER — Encounter: Payer: Self-pay | Admitting: Family Medicine

## 2015-10-17 ENCOUNTER — Ambulatory Visit (INDEPENDENT_AMBULATORY_CARE_PROVIDER_SITE_OTHER): Payer: Medicare Other | Admitting: Family Medicine

## 2015-10-17 VITALS — BP 146/74 | HR 85 | Temp 97.9°F | Wt 144.0 lb

## 2015-10-17 DIAGNOSIS — I5032 Chronic diastolic (congestive) heart failure: Secondary | ICD-10-CM | POA: Diagnosis not present

## 2015-10-17 DIAGNOSIS — R739 Hyperglycemia, unspecified: Secondary | ICD-10-CM

## 2015-10-17 DIAGNOSIS — E785 Hyperlipidemia, unspecified: Secondary | ICD-10-CM

## 2015-10-17 DIAGNOSIS — Z23 Encounter for immunization: Secondary | ICD-10-CM | POA: Diagnosis present

## 2015-10-17 DIAGNOSIS — Z0000 Encounter for general adult medical examination without abnormal findings: Secondary | ICD-10-CM

## 2015-10-17 DIAGNOSIS — E2839 Other primary ovarian failure: Secondary | ICD-10-CM | POA: Diagnosis not present

## 2015-10-17 DIAGNOSIS — I10 Essential (primary) hypertension: Secondary | ICD-10-CM | POA: Diagnosis not present

## 2015-10-17 DIAGNOSIS — Z780 Asymptomatic menopausal state: Secondary | ICD-10-CM | POA: Diagnosis not present

## 2015-10-17 DIAGNOSIS — Z7189 Other specified counseling: Secondary | ICD-10-CM | POA: Insufficient documentation

## 2015-10-17 LAB — CBC
HEMATOCRIT: 36.6 % (ref 35.0–45.0)
HEMOGLOBIN: 12.5 g/dL (ref 11.7–15.5)
MCH: 31.4 pg (ref 27.0–33.0)
MCHC: 34.2 g/dL (ref 32.0–36.0)
MCV: 92 fL (ref 80.0–100.0)
MPV: 9.6 fL (ref 7.5–12.5)
Platelets: 182 10*3/uL (ref 140–400)
RBC: 3.98 MIL/uL (ref 3.80–5.10)
RDW: 14.3 % (ref 11.0–15.0)
WBC: 6.5 10*3/uL (ref 3.8–10.8)

## 2015-10-17 LAB — LIPID PANEL
CHOL/HDL RATIO: 2.3 ratio (ref <=5.0)
Cholesterol: 159 mg/dL (ref 125–200)
HDL: 70 mg/dL (ref >=46)
LDL CALC: 78 mg/dL (ref <130)
Triglycerides: 55 mg/dL (ref <150)
VLDL: 11 mg/dL (ref <30)

## 2015-10-17 LAB — BASIC METABOLIC PANEL WITH GFR
BUN: 29 mg/dL — AB (ref 7–25)
CO2: 21 mmol/L (ref 20–31)
Calcium: 9.4 mg/dL (ref 8.6–10.4)
Chloride: 108 mmol/L (ref 98–110)
Creat: 1.13 mg/dL — ABNORMAL HIGH (ref 0.60–0.93)
GFR, EST AFRICAN AMERICAN: 54 mL/min — AB (ref >=60)
GFR, EST NON AFRICAN AMERICAN: 47 mL/min — AB (ref >=60)
GLUCOSE: 120 mg/dL — AB (ref 65–99)
POTASSIUM: 3.8 mmol/L (ref 3.5–5.3)
Sodium: 138 mmol/L (ref 135–146)

## 2015-10-17 LAB — POCT GLYCOSYLATED HEMOGLOBIN (HGB A1C): HEMOGLOBIN A1C: 5.3

## 2015-10-17 NOTE — Assessment & Plan Note (Signed)
At goal today. Continue norvac, coreg, and hydralazine.

## 2015-10-17 NOTE — Patient Instructions (Signed)
We will do blood work today.  Please schedule your DEXA.  Please take supplements for your bones. You should be on calcium 1200mg  daily and 800IU of vitamin D daily.  Please come back in 3-6 months, or sooner if you need anything else.  Take care,  Dr Jerline Pain

## 2015-10-17 NOTE — Progress Notes (Signed)
    Subjective:  Jennifer Hinton is a 80 y.o. female who presents to the [facility] today with a chief complaint of HTN follow up.   HPI:  Hypertension BP Readings from Last 3 Encounters:  10/17/15 (!) 146/74  05/09/15 (!) 154/70  12/19/14 131/85   Home BP monitoring-No Compliant with medications-yes, without side effects ROS-Denies any CP, HA, SOB, blurry vision, LE edema, transient weakness, orthopnea, PND.   Dyslipidemia Not currently on any medications. Trying to manage through lifestyle modifications. LDL 52 last year.  CHF Tolerating lasix and coreg without side effects. Says that she only takes lasix a few times per week. No orthopnea or PND. Stable lower extremity edema.   ROS: Per HPI  PMH: Smoking history reviewed.   Objective:  Physical Exam: BP (!) 146/74   Pulse 85   Temp 97.9 F (36.6 C) (Oral)   Wt 144 lb (65.3 kg)   SpO2 98%   BMI 23.96 kg/m   Gen: NAD, resting comfortably CV: RRR with no murmurs appreciated Pulm: NWOB, CTAB with no crackles, wheezes, or rhonchi GI: Normal bowel sounds present. Soft, Nontender, Nondistended. MSK: no edema, cyanosis, or clubbing noted Skin: warm, dry Neuro: grossly normal, moves all extremities Psych: Normal affect and thought content  Assessment/Plan:  Essential hypertension At goal today. Continue norvac, coreg, and hydralazine.   Dyslipidemia Check lipid panel today.   Chronic diastolic congestive heart failure Euvolemic on exam. Doing well. Continue current medications.   Healthcare maintenance Check A1c and lipid panel today. Will schedule DEXA. PNA shot given today.   Post-menopause Will order DEXA. Encouraged patient to start taking calcium and vitamin D supplements.    Algis Greenhouse. Jerline Pain, [place] Resident PGY-3 10/17/2015 9:48 AM

## 2015-10-17 NOTE — Assessment & Plan Note (Signed)
Will order DEXA. Encouraged patient to start taking calcium and vitamin D supplements.

## 2015-10-17 NOTE — Assessment & Plan Note (Signed)
Check lipid panel today 

## 2015-10-17 NOTE — Assessment & Plan Note (Signed)
Euvolemic on exam. Doing well. Continue current medications.

## 2015-10-17 NOTE — Assessment & Plan Note (Signed)
Check A1c and lipid panel today. Will schedule DEXA. PNA shot given today.

## 2015-10-19 ENCOUNTER — Encounter: Payer: Self-pay | Admitting: Family Medicine

## 2015-10-25 ENCOUNTER — Ambulatory Visit (INDEPENDENT_AMBULATORY_CARE_PROVIDER_SITE_OTHER): Payer: Medicare Other | Admitting: *Deleted

## 2015-10-25 DIAGNOSIS — I482 Chronic atrial fibrillation: Secondary | ICD-10-CM | POA: Diagnosis not present

## 2015-10-25 DIAGNOSIS — I4891 Unspecified atrial fibrillation: Secondary | ICD-10-CM

## 2015-10-25 DIAGNOSIS — Z5181 Encounter for therapeutic drug level monitoring: Secondary | ICD-10-CM

## 2015-10-25 DIAGNOSIS — I4821 Permanent atrial fibrillation: Secondary | ICD-10-CM

## 2015-10-25 LAB — POCT INR: INR: 2.8

## 2015-12-02 ENCOUNTER — Other Ambulatory Visit: Payer: Self-pay | Admitting: Cardiology

## 2015-12-15 ENCOUNTER — Other Ambulatory Visit: Payer: Self-pay | Admitting: Cardiology

## 2015-12-18 ENCOUNTER — Ambulatory Visit (INDEPENDENT_AMBULATORY_CARE_PROVIDER_SITE_OTHER): Payer: Medicare Other

## 2015-12-18 DIAGNOSIS — I482 Chronic atrial fibrillation: Secondary | ICD-10-CM | POA: Diagnosis not present

## 2015-12-18 DIAGNOSIS — Z5181 Encounter for therapeutic drug level monitoring: Secondary | ICD-10-CM | POA: Diagnosis not present

## 2015-12-18 DIAGNOSIS — I4821 Permanent atrial fibrillation: Secondary | ICD-10-CM

## 2015-12-18 DIAGNOSIS — I4891 Unspecified atrial fibrillation: Secondary | ICD-10-CM | POA: Diagnosis not present

## 2015-12-18 LAB — POCT INR: INR: 3

## 2016-02-07 ENCOUNTER — Ambulatory Visit (INDEPENDENT_AMBULATORY_CARE_PROVIDER_SITE_OTHER): Payer: Medicare Other | Admitting: *Deleted

## 2016-02-07 DIAGNOSIS — Z5181 Encounter for therapeutic drug level monitoring: Secondary | ICD-10-CM | POA: Diagnosis not present

## 2016-02-07 DIAGNOSIS — I4821 Permanent atrial fibrillation: Secondary | ICD-10-CM

## 2016-02-07 DIAGNOSIS — I482 Chronic atrial fibrillation: Secondary | ICD-10-CM

## 2016-02-07 DIAGNOSIS — I4891 Unspecified atrial fibrillation: Secondary | ICD-10-CM | POA: Diagnosis not present

## 2016-02-07 LAB — POCT INR: INR: 2.4

## 2016-03-12 ENCOUNTER — Other Ambulatory Visit: Payer: Self-pay | Admitting: Cardiology

## 2016-04-10 ENCOUNTER — Ambulatory Visit (INDEPENDENT_AMBULATORY_CARE_PROVIDER_SITE_OTHER): Payer: Medicare Other | Admitting: *Deleted

## 2016-04-10 DIAGNOSIS — I4891 Unspecified atrial fibrillation: Secondary | ICD-10-CM

## 2016-04-10 DIAGNOSIS — I482 Chronic atrial fibrillation: Secondary | ICD-10-CM | POA: Diagnosis not present

## 2016-04-10 DIAGNOSIS — Z5181 Encounter for therapeutic drug level monitoring: Secondary | ICD-10-CM | POA: Diagnosis not present

## 2016-04-10 DIAGNOSIS — I4821 Permanent atrial fibrillation: Secondary | ICD-10-CM

## 2016-04-10 LAB — POCT INR: INR: 2.5

## 2016-05-08 ENCOUNTER — Other Ambulatory Visit: Payer: Self-pay | Admitting: *Deleted

## 2016-05-08 MED ORDER — CARVEDILOL 25 MG PO TABS: 25.0000 mg | ORAL_TABLET | Freq: Two times a day (BID) | ORAL | 4 refills | Status: DC

## 2016-05-09 ENCOUNTER — Other Ambulatory Visit: Payer: Self-pay

## 2016-05-20 ENCOUNTER — Other Ambulatory Visit: Payer: Self-pay

## 2016-05-20 MED ORDER — CARVEDILOL 25 MG PO TABS: 25.0000 mg | ORAL_TABLET | Freq: Two times a day (BID) | ORAL | 0 refills | Status: DC

## 2016-05-21 ENCOUNTER — Ambulatory Visit: Payer: Medicare Other | Admitting: Student

## 2016-06-11 ENCOUNTER — Other Ambulatory Visit: Payer: Self-pay | Admitting: Cardiology

## 2016-06-12 ENCOUNTER — Ambulatory Visit (INDEPENDENT_AMBULATORY_CARE_PROVIDER_SITE_OTHER): Payer: Medicare Other | Admitting: *Deleted

## 2016-06-12 DIAGNOSIS — I4821 Permanent atrial fibrillation: Secondary | ICD-10-CM

## 2016-06-12 DIAGNOSIS — I4891 Unspecified atrial fibrillation: Secondary | ICD-10-CM | POA: Diagnosis not present

## 2016-06-12 DIAGNOSIS — Z5181 Encounter for therapeutic drug level monitoring: Secondary | ICD-10-CM | POA: Diagnosis not present

## 2016-06-12 DIAGNOSIS — I482 Chronic atrial fibrillation: Secondary | ICD-10-CM | POA: Diagnosis not present

## 2016-06-12 LAB — POCT INR: INR: 3

## 2016-06-21 ENCOUNTER — Other Ambulatory Visit: Payer: Self-pay | Admitting: *Deleted

## 2016-06-21 MED ORDER — HYDRALAZINE HCL 50 MG PO TABS: 50.0000 mg | ORAL_TABLET | Freq: Three times a day (TID) | ORAL | 3 refills | Status: DC

## 2016-06-21 NOTE — Telephone Encounter (Signed)
Refill request for 90 day supply.  Martin, Tamika L, RN  

## 2016-07-15 ENCOUNTER — Other Ambulatory Visit: Payer: Self-pay | Admitting: Cardiology

## 2016-07-15 DIAGNOSIS — I10 Essential (primary) hypertension: Secondary | ICD-10-CM

## 2016-08-03 ENCOUNTER — Other Ambulatory Visit: Payer: Self-pay | Admitting: Cardiology

## 2016-08-05 NOTE — Telephone Encounter (Signed)
Rx(s) sent to pharmacy electronically.  

## 2016-08-07 ENCOUNTER — Ambulatory Visit (INDEPENDENT_AMBULATORY_CARE_PROVIDER_SITE_OTHER): Payer: Medicare Other | Admitting: *Deleted

## 2016-08-07 DIAGNOSIS — I4821 Permanent atrial fibrillation: Secondary | ICD-10-CM

## 2016-08-07 DIAGNOSIS — I482 Chronic atrial fibrillation: Secondary | ICD-10-CM

## 2016-08-07 DIAGNOSIS — I4891 Unspecified atrial fibrillation: Secondary | ICD-10-CM

## 2016-08-07 DIAGNOSIS — Z5181 Encounter for therapeutic drug level monitoring: Secondary | ICD-10-CM

## 2016-08-07 LAB — POCT INR: INR: 3.3

## 2016-09-06 ENCOUNTER — Other Ambulatory Visit: Payer: Self-pay | Admitting: Cardiology

## 2016-09-18 ENCOUNTER — Ambulatory Visit (INDEPENDENT_AMBULATORY_CARE_PROVIDER_SITE_OTHER): Payer: Medicare Other | Admitting: *Deleted

## 2016-09-18 DIAGNOSIS — I4891 Unspecified atrial fibrillation: Secondary | ICD-10-CM

## 2016-09-18 DIAGNOSIS — I4821 Permanent atrial fibrillation: Secondary | ICD-10-CM

## 2016-09-18 DIAGNOSIS — Z5181 Encounter for therapeutic drug level monitoring: Secondary | ICD-10-CM | POA: Diagnosis not present

## 2016-09-18 DIAGNOSIS — I482 Chronic atrial fibrillation: Secondary | ICD-10-CM | POA: Diagnosis not present

## 2016-09-18 LAB — POCT INR: INR: 2.9

## 2016-09-26 ENCOUNTER — Encounter: Payer: Medicare Other | Admitting: Family Medicine

## 2016-10-06 ENCOUNTER — Other Ambulatory Visit: Payer: Self-pay | Admitting: Cardiology

## 2016-10-14 ENCOUNTER — Other Ambulatory Visit: Payer: Self-pay | Admitting: Cardiology

## 2016-11-06 ENCOUNTER — Other Ambulatory Visit: Payer: Self-pay

## 2016-11-06 ENCOUNTER — Ambulatory Visit (INDEPENDENT_AMBULATORY_CARE_PROVIDER_SITE_OTHER): Payer: Medicare Other | Admitting: *Deleted

## 2016-11-06 ENCOUNTER — Other Ambulatory Visit: Payer: Self-pay | Admitting: Cardiology

## 2016-11-06 DIAGNOSIS — Z5181 Encounter for therapeutic drug level monitoring: Secondary | ICD-10-CM

## 2016-11-06 DIAGNOSIS — I4891 Unspecified atrial fibrillation: Secondary | ICD-10-CM | POA: Diagnosis not present

## 2016-11-06 DIAGNOSIS — I4821 Permanent atrial fibrillation: Secondary | ICD-10-CM

## 2016-11-06 LAB — POCT INR: INR: 2.8

## 2016-11-12 ENCOUNTER — Ambulatory Visit (INDEPENDENT_AMBULATORY_CARE_PROVIDER_SITE_OTHER): Payer: Medicare Other | Admitting: Family Medicine

## 2016-11-12 ENCOUNTER — Encounter: Payer: Self-pay | Admitting: Family Medicine

## 2016-11-12 VITALS — BP 138/66 | HR 91 | Temp 98.0°F | Wt 157.0 lb

## 2016-11-12 DIAGNOSIS — Z0000 Encounter for general adult medical examination without abnormal findings: Secondary | ICD-10-CM | POA: Diagnosis not present

## 2016-11-12 NOTE — Patient Instructions (Signed)
It was great seeing you today! We have addressed the following issues today  1. I will do blood work to check kidney function and hemoglobin level. 2. Your exam is normal. Continue to watch your diet, and exercise as much as you can. 3. Take your medications as discussed today.  If we did any lab work today, and the results require attention, either me or my nurse will get in touch with you. If everything is normal, you will get a letter in mail and a message via . If you don't hear from [place] in two weeks, please give [place] a call. Otherwise, we look forward to seeing you again at your next visit. If you have any questions or concerns before then, please call the clinic at [phone].  Please bring all your medications to every doctors visit  Sign up for My Chart to have easy access to your labs results, and communication with your Primary care physician. Please ask Front Desk for some assistance.   Please check-out at the front desk before leaving the clinic.    Take Care,   Dr. Andy Gauss

## 2016-11-12 NOTE — Progress Notes (Signed)
   Subjective:    Patient ID: Jennifer Hinton, female    DOB: [birth year], 81 y.o.   MRN: [id number]   CC: Yearly physical  HPI: She is a 81 year old female with a past medical history significant for atrial fibrillation, CHF, hyperlipidemia, hypertension and rheumatoid arthritis who presented today for annual physical exam. Patient has been doing well with no acute concern today. She is accompanied by her daughter. Patient has been taking medication as prescribed except for furosemide which she reports makes her urinate too frequently during the day. Patient hasn't taken her furosemide in a few months. Patient has a cardiologist Dr. Stanford Breed who has been following and CHF. Patient endorses good exercise regimen as well as balance control diet. Patient denies chest pain, shortness of breath, abdominal pain, dizziness, headache, vision change, nausea, vomiting.  Smoking status reviewed   ROS: all other systems were reviewed and are negative other than in the HPI   Past Medical History:  Diagnosis Date  . Atrial fibrillation ([place])   . CHF (congestive heart failure) ([place])   . Dysrhythmia    A FIB   . Hyperlipidemia   . Hypertension   . RA (rheumatoid arthritis) ([place])     Past Surgical History:  Procedure Laterality Date  . NO PAST SURGERIES      Objective:  BP 138/66   Pulse 91   Temp 98 F (36.7 C) (Oral)   Wt 157 lb (71.2 kg)   SpO2 96%   BMI 26.13 kg/m   Vitals and nursing note reviewed  General: NAD, pleasant, able to participate in exam Cardiac: RRR, normal heart sounds, no murmurs. 2+ radial and PT pulses bilaterally Respiratory: CTAB, normal effort, No wheezes, rales or rhonchi Abdomen: soft, nontender, nondistended, no hepatic or splenomegaly, +BS Extremities: no edema or cyanosis. WWP. Skin: warm and dry, no rashes noted Neuro: alert and oriented x4, no focal deficits Psych: Normal affect and mood   Assessment & Plan:   #Annual physical Patient is new to me,  however seems to be at her baseline from health point with no acute complaint. Exam was unremarkable. Discussed medication adherence. Patient with good understanding of plan and agreement. Patient hasn't had lab work done in a year. Patient is on Coumadin for her history of A. fib and has been therapeutic for many months. Previous lipid panel was within normal limits and TSH was in the normal range. Signs are adequate. Reinforce need for exercise and balanced diet. --Order CBC and BMP --Will follow-up with patient as needed  [name], MD [facility] PGY-2

## 2016-11-13 LAB — CBC
HEMOGLOBIN: 11.5 g/dL (ref 11.1–15.9)
Hematocrit: 34.3 % (ref 34.0–46.6)
MCH: 31.3 pg (ref 26.6–33.0)
MCHC: 33.5 g/dL (ref 31.5–35.7)
MCV: 93 fL (ref 79–97)
Platelets: 160 10*3/uL (ref 150–379)
RBC: 3.68 x10E6/uL — AB (ref 3.77–5.28)
RDW: 14.7 % (ref 12.3–15.4)
WBC: 6.8 10*3/uL (ref 3.4–10.8)

## 2016-11-13 LAB — BASIC METABOLIC PANEL
BUN/Creatinine Ratio: 26 (ref 12–28)
BUN: 40 mg/dL — ABNORMAL HIGH (ref 8–27)
CALCIUM: 9.4 mg/dL (ref 8.7–10.3)
CO2: 19 mmol/L — AB (ref 20–29)
CREATININE: 1.51 mg/dL — AB (ref 0.57–1.00)
Chloride: 105 mmol/L (ref 96–106)
GFR, EST AFRICAN AMERICAN: 38 mL/min/{1.73_m2} — AB (ref >59)
GFR, EST NON AFRICAN AMERICAN: 33 mL/min/{1.73_m2} — AB (ref >59)
Glucose: 118 mg/dL — ABNORMAL HIGH (ref 65–99)
Potassium: 4.4 mmol/L (ref 3.5–5.2)
Sodium: 139 mmol/L (ref 134–144)

## 2016-12-27 ENCOUNTER — Other Ambulatory Visit: Payer: Self-pay | Admitting: Family Medicine

## 2017-01-10 ENCOUNTER — Other Ambulatory Visit: Payer: Self-pay | Admitting: Cardiology

## 2017-01-22 ENCOUNTER — Encounter (INDEPENDENT_AMBULATORY_CARE_PROVIDER_SITE_OTHER): Payer: Self-pay

## 2017-01-22 ENCOUNTER — Ambulatory Visit (INDEPENDENT_AMBULATORY_CARE_PROVIDER_SITE_OTHER): Payer: Medicare Other | Admitting: *Deleted

## 2017-01-22 DIAGNOSIS — I4821 Permanent atrial fibrillation: Secondary | ICD-10-CM

## 2017-01-22 DIAGNOSIS — I4891 Unspecified atrial fibrillation: Secondary | ICD-10-CM | POA: Diagnosis not present

## 2017-01-22 DIAGNOSIS — I482 Chronic atrial fibrillation: Secondary | ICD-10-CM | POA: Diagnosis not present

## 2017-01-22 DIAGNOSIS — Z5181 Encounter for therapeutic drug level monitoring: Secondary | ICD-10-CM | POA: Diagnosis not present

## 2017-01-22 LAB — POCT INR: INR: 3.8

## 2017-01-22 NOTE — Patient Instructions (Signed)
Do not take coumadin today Dec 5th then continue same dose of coumadin 1 tablet daily.  Recheck INR in 2 weeks.

## 2017-03-17 ENCOUNTER — Other Ambulatory Visit: Payer: Self-pay | Admitting: Cardiology

## 2017-03-17 NOTE — Telephone Encounter (Signed)
Overdue for INR check. Last INR SUPRA-therapeutic

## 2017-03-18 ENCOUNTER — Ambulatory Visit (INDEPENDENT_AMBULATORY_CARE_PROVIDER_SITE_OTHER): Payer: Medicare Other | Admitting: *Deleted

## 2017-03-18 ENCOUNTER — Other Ambulatory Visit: Payer: Self-pay | Admitting: *Deleted

## 2017-03-18 DIAGNOSIS — I4891 Unspecified atrial fibrillation: Secondary | ICD-10-CM

## 2017-03-18 DIAGNOSIS — I482 Chronic atrial fibrillation: Secondary | ICD-10-CM

## 2017-03-18 DIAGNOSIS — I4821 Permanent atrial fibrillation: Secondary | ICD-10-CM

## 2017-03-18 DIAGNOSIS — Z5181 Encounter for therapeutic drug level monitoring: Secondary | ICD-10-CM | POA: Diagnosis not present

## 2017-03-18 LAB — POCT INR: INR: 3.3

## 2017-03-18 NOTE — Patient Instructions (Signed)
Description   Pt did not take coumadin last night so instructed to  continue same dose of coumadin 1 tablet daily.  Recheck INR in 2 weeks.

## 2017-03-19 ENCOUNTER — Ambulatory Visit: Payer: Medicare Other

## 2017-04-09 ENCOUNTER — Other Ambulatory Visit: Payer: Self-pay | Admitting: Cardiology

## 2017-04-09 DIAGNOSIS — I10 Essential (primary) hypertension: Secondary | ICD-10-CM

## 2017-04-22 ENCOUNTER — Other Ambulatory Visit: Payer: Self-pay | Admitting: Cardiology

## 2017-04-23 ENCOUNTER — Telehealth: Payer: Self-pay | Admitting: Cardiology

## 2017-04-23 NOTE — Telephone Encounter (Signed)
New message   If Home Health RN is calling please get Coumadin Nurse on the phone STAT  1.  Are you calling in regards to an appointment? yes  2.  Are you calling for a refill ? yes  3.  Are you having bleeding issues? no  4.  Do you need clearance to hold Coumadin? no  Pt verbalized that she was told to make an appt this week with coumadin to get her medication, she says the only day she can come in is tomorrow when her daughter comes but it is no availability for tomorrow. So she wants to speak with a nurse  Please route to the [place]

## 2017-04-23 NOTE — Telephone Encounter (Signed)
[facility] nurse called and spoke with pt and appt scheduled for tomorrow. Pt does have enough medication until appt.

## 2017-04-24 ENCOUNTER — Ambulatory Visit (INDEPENDENT_AMBULATORY_CARE_PROVIDER_SITE_OTHER): Payer: Medicare Other | Admitting: *Deleted

## 2017-04-24 DIAGNOSIS — I4891 Unspecified atrial fibrillation: Secondary | ICD-10-CM

## 2017-04-24 DIAGNOSIS — I4821 Permanent atrial fibrillation: Secondary | ICD-10-CM

## 2017-04-24 DIAGNOSIS — Z5181 Encounter for therapeutic drug level monitoring: Secondary | ICD-10-CM | POA: Diagnosis not present

## 2017-04-24 LAB — POCT INR: INR: 4.7

## 2017-04-24 NOTE — Patient Instructions (Signed)
Description   Skip today's dose, then change your dose to 1 tablet daily except 1/2 tablet on Mondays and Fridays.   Recheck INR in 2 weeks. Call us with any medication changes or concerns [phone]

## 2017-05-05 ENCOUNTER — Other Ambulatory Visit: Payer: Self-pay | Admitting: Cardiology

## 2017-05-08 ENCOUNTER — Ambulatory Visit (INDEPENDENT_AMBULATORY_CARE_PROVIDER_SITE_OTHER): Payer: Medicare Other | Admitting: *Deleted

## 2017-05-08 DIAGNOSIS — I4891 Unspecified atrial fibrillation: Secondary | ICD-10-CM | POA: Diagnosis not present

## 2017-05-08 DIAGNOSIS — I4821 Permanent atrial fibrillation: Secondary | ICD-10-CM

## 2017-05-08 DIAGNOSIS — Z5181 Encounter for therapeutic drug level monitoring: Secondary | ICD-10-CM | POA: Diagnosis not present

## 2017-05-08 LAB — POCT INR: INR: 3

## 2017-05-08 NOTE — Patient Instructions (Signed)
Description   Continue taking  1 tablet daily except 1/2 tablet on Mondays and Fridays. Eat 3 servings each week of leafy green vegetable.  Recheck INR in 2 weeks. Call us with any medication changes or concerns [phone]

## 2017-05-27 ENCOUNTER — Ambulatory Visit (INDEPENDENT_AMBULATORY_CARE_PROVIDER_SITE_OTHER): Payer: Medicare Other | Admitting: Pharmacist Clinician (PhC)/ Clinical Pharmacy Specialist

## 2017-05-27 ENCOUNTER — Ambulatory Visit (INDEPENDENT_AMBULATORY_CARE_PROVIDER_SITE_OTHER): Payer: Medicare Other | Admitting: Physician Assistant

## 2017-05-27 ENCOUNTER — Encounter: Payer: Self-pay | Admitting: Physician Assistant

## 2017-05-27 VITALS — BP 160/82 | HR 88 | Ht 65.0 in | Wt 143.0 lb

## 2017-05-27 DIAGNOSIS — I4891 Unspecified atrial fibrillation: Secondary | ICD-10-CM | POA: Diagnosis not present

## 2017-05-27 DIAGNOSIS — I5032 Chronic diastolic (congestive) heart failure: Secondary | ICD-10-CM

## 2017-05-27 DIAGNOSIS — I482 Chronic atrial fibrillation: Secondary | ICD-10-CM

## 2017-05-27 DIAGNOSIS — Z5181 Encounter for therapeutic drug level monitoring: Secondary | ICD-10-CM

## 2017-05-27 DIAGNOSIS — I272 Pulmonary hypertension, unspecified: Secondary | ICD-10-CM

## 2017-05-27 DIAGNOSIS — I361 Nonrheumatic tricuspid (valve) insufficiency: Secondary | ICD-10-CM

## 2017-05-27 DIAGNOSIS — I110 Hypertensive heart disease with heart failure: Secondary | ICD-10-CM

## 2017-05-27 DIAGNOSIS — I4821 Permanent atrial fibrillation: Secondary | ICD-10-CM

## 2017-05-27 DIAGNOSIS — E785 Hyperlipidemia, unspecified: Secondary | ICD-10-CM

## 2017-05-27 LAB — COMPREHENSIVE METABOLIC PANEL
A/G RATIO: 0.9 — AB (ref 1.2–2.2)
ALK PHOS: 60 IU/L (ref 39–117)
ALT: 13 IU/L (ref 0–32)
AST: 33 IU/L (ref 0–40)
Albumin: 3.8 g/dL (ref 3.5–4.8)
BILIRUBIN TOTAL: 0.6 mg/dL (ref 0.0–1.2)
BUN/Creatinine Ratio: 16 (ref 12–28)
BUN: 24 mg/dL (ref 8–27)
CO2: 19 mmol/L — ABNORMAL LOW (ref 20–29)
Calcium: 9.4 mg/dL (ref 8.7–10.3)
Chloride: 105 mmol/L (ref 96–106)
Creatinine, Ser: 1.5 mg/dL — ABNORMAL HIGH (ref 0.57–1.00)
GFR calc Af Amer: 38 mL/min/{1.73_m2} — ABNORMAL LOW (ref >59)
GFR calc non Af Amer: 33 mL/min/{1.73_m2} — ABNORMAL LOW (ref >59)
GLUCOSE: 116 mg/dL — AB (ref 65–99)
Globulin, Total: 4.1 g/dL (ref 1.5–4.5)
POTASSIUM: 3.6 mmol/L (ref 3.5–5.2)
Sodium: 140 mmol/L (ref 134–144)
Total Protein: 7.9 g/dL (ref 6.0–8.5)

## 2017-05-27 LAB — CBC
Hematocrit: 31.8 % — ABNORMAL LOW (ref 34.0–46.6)
Hemoglobin: 10.9 g/dL — ABNORMAL LOW (ref 11.1–15.9)
MCH: 30.7 pg (ref 26.6–33.0)
MCHC: 34.3 g/dL (ref 31.5–35.7)
MCV: 90 fL (ref 79–97)
PLATELETS: 201 10*3/uL (ref 150–379)
RBC: 3.55 x10E6/uL — ABNORMAL LOW (ref 3.77–5.28)
RDW: 15.8 % — AB (ref 12.3–15.4)
WBC: 6.3 10*3/uL (ref 3.4–10.8)

## 2017-05-27 LAB — POCT INR: INR: 3.8

## 2017-05-27 LAB — LIPID PANEL
CHOLESTEROL TOTAL: 156 mg/dL (ref 100–199)
Chol/HDL Ratio: 2.8 ratio (ref 0.0–4.4)
HDL: 56 mg/dL (ref >39)
LDL Calculated: 88 mg/dL (ref 0–99)
Triglycerides: 59 mg/dL (ref 0–149)
VLDL CHOLESTEROL CAL: 12 mg/dL (ref 5–40)

## 2017-05-27 LAB — TSH: TSH: 1.76 u[IU]/mL (ref 0.450–4.500)

## 2017-05-27 MED ORDER — HYDRALAZINE HCL 50 MG PO TABS: 50.0000 mg | ORAL_TABLET | Freq: Three times a day (TID) | ORAL | 2 refills | Status: DC

## 2017-05-27 MED ORDER — FUROSEMIDE 20 MG PO TABS: 20.0000 mg | ORAL_TABLET | Freq: Every day | ORAL | 2 refills | Status: DC

## 2017-05-27 MED ORDER — CARVEDILOL 25 MG PO TABS: ORAL_TABLET | ORAL | 2 refills | Status: DC

## 2017-05-27 MED ORDER — HYDRALAZINE HCL 100 MG PO TABS: 100.0000 mg | ORAL_TABLET | Freq: Three times a day (TID) | ORAL | 6 refills | Status: DC

## 2017-05-27 NOTE — Addendum Note (Signed)
Addended by: Ulice Brilliant T on: 05/27/2017 10:41 AM   Modules accepted: Orders

## 2017-05-27 NOTE — Patient Instructions (Signed)
Medication Instructions:  INCREASE Hydralazine to 100mg  take 1 tablet three times a day  Labwork: Your physician recommends that you return for lab work in: TODAY-CBC, CMP, LIPID, TSH  Testing/Procedures: Your physician has requested that you have an echocardiogram. Echocardiography is a painless test that uses sound waves to create images of your heart. It provides your doctor with information about the size and shape of your heart and how well your heart's chambers and valves are working. This procedure takes approximately one hour. There are no restrictions for this procedure.  Jennifer Hinton wants you to follow-up in: 6-9 MONTHS with DR CRENSHAW ONLY.  You will receive a reminder letter in the mail two months in advance. If you don't receive a letter, please call our office to schedule the follow-up appointment.  Any Other Special Instructions Will Be Listed Below (If Applicable).  If you need a refill on your cardiac medications before your next appointment, please call your pharmacy.

## 2017-05-27 NOTE — Progress Notes (Signed)
Cardiology Office Note    Date:  05/27/2017   ID:  [name], DOB 17-Aug-1935, MRN [id number]  PCP:  [name], MD  Cardiologist:  Dr. Stanford Breed   Chief Complaint  Patient presents with  . Follow-up    2 year followup for afib. Seen for Dr. Stanford Breed    History of Present Illness:  Jennifer Hinton is a 82 y.o. female with PMH of HTN, HLD, chronic diastolic heart failure, pulmonary hypertension, RA, and history of long standing atrial fibrillation.  Echocardiogram in April 2016 showed normal LV function EF 60% moderate to severe LVH, moderate MR, mild aortic regurgitation, severe LAE, severe RA E, severe TR, severely elevated pulmonary pressure and a small pericardial effusion.  She had a history of uncontrolled high blood pressure.  Her last office visit with Dr. Stanford Breed was on 05/09/2015, Lasix was switched to 20 mg daily at that time for her diastolic heart failure.  She has failed to follow-up since.   Patient presents today for cardiology office visit.  She denies ever having any chest pain before.  Her last creatinine did trend up in September 2018, I will obtain a complete metabolic panel today.  She is due for multiple labs including TSH and a fasting lipid panel.  I will obtain a CBC as well.  Her Coumadin level has been mildly elevated recently.  We did discuss potentially consider Xarelto or Eliquis in the future as well.  We discussed the benefit of NOAC compared to traditional Coumadin.  Her blood pressure remained quite elevated, I will increase hydralazine to 100 mg 3 times daily.  She will need a repeat echocardiogram to assess the severity of valvular issue.  Otherwise she has chronic ankle edema, however I would recommend continue on the current dose of Lasix with conservative therapy such as compression stocking and leg elevation on the side.  She is wearing compression stocking today.  Otherwise she denies any significant orthopnea or PND.  She does not walk around very  much however this is mainly due to bad knee issue.  Previous x-ray of the knee in 2016 were reviewed, she has severe degenerative disease on both side.  She says her right knee is worse than the left knee.  I recommended for her to discuss with her primary care provider who can potentially refer her to orthopedic surgery.  She is firm that she does not want any orthopedics surgery, however I also mentioned not all degenerative disease need surgery and sometimes it can be managed through the knee injection as well.  I think she was more amenable to the idea of knee injection.   Past Medical History:  Diagnosis Date  . Atrial fibrillation ([place])   . CHF (congestive heart failure) ([place])   . Dysrhythmia    A FIB   . Hyperlipidemia   . Hypertension   . RA (rheumatoid arthritis) ([place])     Past Surgical History:  Procedure Laterality Date  . NO PAST SURGERIES      Current Medications: Outpatient Medications Prior to Visit  Medication Sig Dispense Refill  . acetaminophen (TYLENOL) 650 MG CR tablet One tab by mouth q4-6h as needed pain in knee     . amLODipine (NORVASC) 10 MG tablet TAKE 1 TABLET BY MOUTH EVERY DAY 90 tablet 2  . warfarin (COUMADIN) 5 MG tablet TAKE AS DIRECTED BY COUMADIN CLINIC 35 tablet 1  . carvedilol (COREG) 25 MG tablet TAKE 1 TABLET BY MOUTH TWICE A DAY WITH  A MEAL, SCHEDULE APPT FOR REFILLS 180 tablet 0  . furosemide (LASIX) 20 MG tablet Take 1 tablet (20 mg total) by mouth daily. PLEASE CONTACT OFFICE FOR ADDITIONAL REFILLS 15 tablet 0  . hydrALAZINE (APRESOLINE) 50 MG tablet Take 1 tablet (50 mg total) by mouth 3 (three) times daily. 270 tablet 3   No facility-administered medications prior to visit.      Allergies:   Patient has no known allergies.   Social History   Socioeconomic History  . Marital status: Widowed    Spouse name: Not on file  . Number of children: 6  . Years of education: 9th grade   . Highest education level: Not on file  Occupational  History  . 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 Financial resource strain: Not on file  . Food insecurity:    Worry: Not on file    Inability: Not on file  . Transportation needs:    Medical: Not on file    Non-medical: Not on file  Tobacco Use  . Smoking status: Never Smoker  . Smokeless tobacco: Never Used  Substance and Sexual Activity  . Alcohol use: No  . Drug use: No  . Sexual activity: Not on file  Lifestyle  . Physical activity:    Days per week: Not on file    Minutes per session: Not on file  . Stress: Not on file  Relationships  . Social connections:    Talks on phone: Not on file    Gets together: Not on file    Attends religious service: Not on file    Active member of club or organization: Not on file    Attends meetings of clubs or organizations: Not on file    Relationship status: Not on file  Other Topics Concern  . Not on file  Social History Narrative   Had 6 children.   5 children living (oldest daughter passed away).    2 living in [place], 2 living in [place], 1 living in [place].       Raising your 52 yo great grandson Dorris Fetch, also a clinic patient).    Drives. Recently renewed license. Does not own a car so transportation is sometimes a limiting factor.               Family History:  The patient's family history includes Heart disease in her brother; Hyperlipidemia in her brother.   ROS:   Please see the history of present illness.    ROS All other systems reviewed and are negative.   PHYSICAL EXAM:   VS:  BP (!) 160/82   Pulse 88   Ht 5\' 5"  (1.651 m)   Wt 143 lb (64.9 kg)   SpO2 96%   BMI 23.80 kg/m    GEN: Well nourished, well developed, in no acute distress  HEENT: normal  Neck: no JVD, carotid bruits, or masses Cardiac: RRR; no murmurs, rubs, or gallops. Chronic ankle edema  Respiratory:  clear to auscultation bilaterally,  normal work of breathing GI: soft, nontender, nondistended, + BS MS: no deformity or atrophy  Skin: warm and dry, no rash Neuro:  Alert and Oriented x 3, Strength and sensation are intact Psych: euthymic mood, full affect  Wt Readings from Last 3 Encounters:  05/27/17 143 lb (64.9 kg)  11/12/16 157 lb (  71.2 kg)  10/17/15 144 lb (65.3 kg)      Studies/Labs Reviewed:   EKG:  EKG is ordered today.  The ekg ordered today demonstrates atrial fibrillation, heart rate very well controlled  Recent Labs: 11/12/2016: BUN 40; Creatinine, Ser 1.51; Hemoglobin 11.5; Platelets 160; Potassium 4.4; Sodium 139   Lipid Panel    Component Value Date/Time   CHOL 159 10/17/2015 0923   TRIG 55 10/17/2015 0923   HDL 70 10/17/2015 0923   CHOLHDL 2.3 10/17/2015 0923   VLDL 11 10/17/2015 0923   LDLCALC 78 10/17/2015 0923   LDLDIRECT 78 09/10/2011 1442    Additional studies/ records that were reviewed today include:   Echo 06/06/2014 LV EF: 60%  ------------------------------------------------------------------- Indications:   CHF - 428.0.  ------------------------------------------------------------------- History:  PMH: PHTN. Dyspnea. Atrial fibrillation. Risk factors: Hypertension. Dyslipidemia.  ------------------------------------------------------------------- Study Conclusions  - Left ventricle: The cavity size was normal. Wall thickness was increased in a pattern of moderate to severe LVH. The estimated ejection fraction was 60%. Wall motion was normal; there were no regional wall motion abnormalities. - Aortic valve: Sclerosis without stenosis. There was mild regurgitation. - Mitral valve: Mildly thickened leaflets . There was moderate regurgitation. - Left atrium: The atrium was severely dilated. - Right ventricle: The cavity size was mildly dilated. Systolic function was mildly to moderately reduced. - Right atrium: The atrium was severely dilated. -  Tricuspid valve: There was severe regurgitation. - Pulmonary arteries: PA peak pressure: 75 mm Hg (S). - Pericardium, extracardiac: A small pericardial effusion was identified posterior to the heart.    ASSESSMENT:    1. Permanent atrial fibrillation ([place])   2. Non-rheumatic tricuspid valve insufficiency   3. Pulmonary hypertension-severe   4. Hypertensive heart disease with chronic diastolic congestive heart failure ([place])   5. Hyperlipidemia, unspecified hyperlipidemia type      PLAN:  In order of problems listed above:  1. Permanent atrial fibrillation: INR has been elevated in the recent months, will obtain CBC.  Denies any obvious bleeding.  Continue on Coumadin, managed by Coumadin clinic.  Rate controlled on carvedilol 25 mg twice daily.  Based on the review of the previous EKG, patient has been in persistent atrial fibrillation for at least the past 3 years.  No plan to convert the patient at this point.  Obtain TSH.  2. Severe TR: Also had MR and atrial regurgitation on last echocardiogram as well, plan to repeat echocardiogram  3. Chronic diastolic heart failure: Continue to have ankle edema, continue on the current dose of Lasix and to use compression stocking and leg elevation.    4. Hypertension: Blood pressure remain elevated, increase hydralazine to 100 mg 3 times a day.  5. Hyperlipidemia: Not on any statins, will need fasting lipid panel and a complete metabolic panel.    Medication Adjustments/Labs and Tests Ordered: Current medicines are reviewed at length with the patient today.  Concerns regarding medicines are outlined above.  Medication changes, Labs and Tests ordered today are listed in the Patient Instructions below. Patient Instructions  Medication Instructions:  INCREASE Hydralazine to 100mg  take 1 tablet three times a day  Labwork: Your physician recommends that you return for lab work in: TODAY-CBC, CMP, LIPID, TSH  Testing/Procedures: Your  physician has requested that you have an echocardiogram. Echocardiography is a painless test that uses sound waves to create images of your heart. It provides your doctor with information about the size and shape of your heart and how well your heart's  chambers and valves are working. This procedure takes approximately one hour. There are no restrictions for this procedure.  Jennifer Hinton wants you to follow-up in: 6-9 MONTHS with DR CRENSHAW ONLY.  You will receive a reminder letter in the mail two months in advance. If you don't receive a letter, please call our office to schedule the follow-up appointment.  Any Other Special Instructions Will Be Listed Below (If Applicable).  If you need a refill on your cardiac medications before your next appointment, please call your pharmacy.      Hilbert Corrigan, [place]  05/27/2017 9:57 AM    [address], [place], [place]  [address] Phone: [phone]; Fax: [phone]

## 2017-05-27 NOTE — Patient Instructions (Signed)
Description   No warfarin today Tuesday April 9, then decrease dose to 1 tablet daily except 1/2 tablet each Monday, Wednesday and Friday.  Repeat INR in 2 weeks

## 2017-06-10 ENCOUNTER — Ambulatory Visit (HOSPITAL_COMMUNITY): Payer: Medicare Other | Attending: Cardiovascular Disease

## 2017-06-10 ENCOUNTER — Ambulatory Visit (INDEPENDENT_AMBULATORY_CARE_PROVIDER_SITE_OTHER): Payer: Medicare Other | Admitting: *Deleted

## 2017-06-10 ENCOUNTER — Other Ambulatory Visit: Payer: Self-pay

## 2017-06-10 DIAGNOSIS — I272 Pulmonary hypertension, unspecified: Secondary | ICD-10-CM | POA: Insufficient documentation

## 2017-06-10 DIAGNOSIS — I5032 Chronic diastolic (congestive) heart failure: Secondary | ICD-10-CM | POA: Diagnosis not present

## 2017-06-10 DIAGNOSIS — I4891 Unspecified atrial fibrillation: Secondary | ICD-10-CM | POA: Diagnosis not present

## 2017-06-10 DIAGNOSIS — E785 Hyperlipidemia, unspecified: Secondary | ICD-10-CM | POA: Insufficient documentation

## 2017-06-10 DIAGNOSIS — I361 Nonrheumatic tricuspid (valve) insufficiency: Secondary | ICD-10-CM | POA: Diagnosis not present

## 2017-06-10 DIAGNOSIS — I4821 Permanent atrial fibrillation: Secondary | ICD-10-CM

## 2017-06-10 DIAGNOSIS — I482 Chronic atrial fibrillation: Secondary | ICD-10-CM | POA: Insufficient documentation

## 2017-06-10 DIAGNOSIS — I088 Other rheumatic multiple valve diseases: Secondary | ICD-10-CM | POA: Diagnosis not present

## 2017-06-10 DIAGNOSIS — I110 Hypertensive heart disease with heart failure: Secondary | ICD-10-CM | POA: Insufficient documentation

## 2017-06-10 DIAGNOSIS — Z5181 Encounter for therapeutic drug level monitoring: Secondary | ICD-10-CM | POA: Insufficient documentation

## 2017-06-10 LAB — POCT INR: INR: 1.6

## 2017-06-10 NOTE — Patient Instructions (Signed)
Description   Today take 1.5 tablets then continue taking 1 tablet daily except 1/2 tablet each Monday, Wednesday and Friday.  Repeat INR in 2 weeks

## 2017-07-01 ENCOUNTER — Ambulatory Visit (INDEPENDENT_AMBULATORY_CARE_PROVIDER_SITE_OTHER): Payer: Medicare Other | Admitting: *Deleted

## 2017-07-01 DIAGNOSIS — I4891 Unspecified atrial fibrillation: Secondary | ICD-10-CM

## 2017-07-01 DIAGNOSIS — I482 Chronic atrial fibrillation: Secondary | ICD-10-CM

## 2017-07-01 DIAGNOSIS — Z5181 Encounter for therapeutic drug level monitoring: Secondary | ICD-10-CM | POA: Diagnosis not present

## 2017-07-01 DIAGNOSIS — I4821 Permanent atrial fibrillation: Secondary | ICD-10-CM

## 2017-07-01 LAB — POCT INR: INR: 2.2

## 2017-07-01 NOTE — Patient Instructions (Signed)
Description   Continue taking 1 tablet daily except 1/2 tablet each Monday, Wednesday and Friday.  Repeat INR in 3 weeks.

## 2017-07-07 ENCOUNTER — Other Ambulatory Visit: Payer: Self-pay | Admitting: Cardiology

## 2017-08-05 ENCOUNTER — Ambulatory Visit (INDEPENDENT_AMBULATORY_CARE_PROVIDER_SITE_OTHER): Payer: Medicare Other | Admitting: *Deleted

## 2017-08-05 DIAGNOSIS — Z5181 Encounter for therapeutic drug level monitoring: Secondary | ICD-10-CM

## 2017-08-05 DIAGNOSIS — I4891 Unspecified atrial fibrillation: Secondary | ICD-10-CM | POA: Diagnosis not present

## 2017-08-05 DIAGNOSIS — I4821 Permanent atrial fibrillation: Secondary | ICD-10-CM

## 2017-08-05 LAB — POCT INR: INR: 2.3 (ref 2.0–3.0)

## 2017-08-05 NOTE — Patient Instructions (Signed)
Description   Continue taking 1 tablet daily except 1/2 tablet each Monday, Wednesday and Friday. Repeat INR in 4 weeks.      

## 2017-09-30 ENCOUNTER — Ambulatory Visit (INDEPENDENT_AMBULATORY_CARE_PROVIDER_SITE_OTHER): Payer: Medicare Other

## 2017-09-30 DIAGNOSIS — I482 Chronic atrial fibrillation: Secondary | ICD-10-CM | POA: Diagnosis not present

## 2017-09-30 DIAGNOSIS — Z5181 Encounter for therapeutic drug level monitoring: Secondary | ICD-10-CM | POA: Diagnosis not present

## 2017-09-30 DIAGNOSIS — I4821 Permanent atrial fibrillation: Secondary | ICD-10-CM

## 2017-09-30 DIAGNOSIS — I4891 Unspecified atrial fibrillation: Secondary | ICD-10-CM | POA: Diagnosis not present

## 2017-09-30 LAB — POCT INR: INR: 2.4 (ref 2.0–3.0)

## 2017-09-30 NOTE — Patient Instructions (Signed)
Description   Continue taking 1 tablet daily except 1/2 tablet each Monday, Wednesday and Friday.  Repeat INR in 6 weeks.

## 2017-11-18 ENCOUNTER — Ambulatory Visit (INDEPENDENT_AMBULATORY_CARE_PROVIDER_SITE_OTHER): Payer: Medicare Other

## 2017-11-18 DIAGNOSIS — I5033 Acute on chronic diastolic (congestive) heart failure: Secondary | ICD-10-CM | POA: Diagnosis not present

## 2017-11-18 DIAGNOSIS — Z5181 Encounter for therapeutic drug level monitoring: Secondary | ICD-10-CM

## 2017-11-18 DIAGNOSIS — I4891 Unspecified atrial fibrillation: Secondary | ICD-10-CM

## 2017-11-18 DIAGNOSIS — I4821 Permanent atrial fibrillation: Secondary | ICD-10-CM

## 2017-11-18 DIAGNOSIS — D62 Acute posthemorrhagic anemia: Secondary | ICD-10-CM

## 2017-11-18 HISTORY — DX: Acute posthemorrhagic anemia: D62

## 2017-11-18 LAB — POCT INR: INR: 2.6 (ref 2.0–3.0)

## 2017-11-18 NOTE — Patient Instructions (Signed)
Continue taking 1 tablet daily except 1/2 tablet each Monday, Wednesday and Friday.  Repeat INR in 6 weeks  

## 2017-12-11 ENCOUNTER — Emergency Department (HOSPITAL_COMMUNITY): Payer: Medicare Other

## 2017-12-11 ENCOUNTER — Inpatient Hospital Stay (HOSPITAL_COMMUNITY)
Admission: EM | Admit: 2017-12-11 | Discharge: 2017-12-19 | DRG: 377 | Disposition: A | Discharge status: Home / Self Care | Payer: Medicare Other | Attending: Internal Medicine | Admitting: Internal Medicine

## 2017-12-11 ENCOUNTER — Encounter (HOSPITAL_COMMUNITY): Payer: Self-pay

## 2017-12-11 ENCOUNTER — Other Ambulatory Visit: Payer: Self-pay

## 2017-12-11 DIAGNOSIS — I5031 Acute diastolic (congestive) heart failure: Secondary | ICD-10-CM

## 2017-12-11 DIAGNOSIS — N179 Acute kidney failure, unspecified: Secondary | ICD-10-CM

## 2017-12-11 DIAGNOSIS — N183 Chronic kidney disease, stage 3 unspecified: Secondary | ICD-10-CM | POA: Diagnosis present

## 2017-12-11 DIAGNOSIS — I361 Nonrheumatic tricuspid (valve) insufficiency: Secondary | ICD-10-CM | POA: Diagnosis not present

## 2017-12-11 DIAGNOSIS — I130 Hypertensive heart and chronic kidney disease with heart failure and stage 1 through stage 4 chronic kidney disease, or unspecified chronic kidney disease: Secondary | ICD-10-CM | POA: Diagnosis not present

## 2017-12-11 DIAGNOSIS — Z8349 Family history of other endocrine, nutritional and metabolic diseases: Secondary | ICD-10-CM | POA: Diagnosis not present

## 2017-12-11 DIAGNOSIS — E785 Hyperlipidemia, unspecified: Secondary | ICD-10-CM | POA: Diagnosis present

## 2017-12-11 DIAGNOSIS — I071 Rheumatic tricuspid insufficiency: Secondary | ICD-10-CM | POA: Diagnosis not present

## 2017-12-11 DIAGNOSIS — I272 Pulmonary hypertension, unspecified: Secondary | ICD-10-CM | POA: Diagnosis present

## 2017-12-11 DIAGNOSIS — I10 Essential (primary) hypertension: Secondary | ICD-10-CM

## 2017-12-11 DIAGNOSIS — M069 Rheumatoid arthritis, unspecified: Secondary | ICD-10-CM | POA: Diagnosis not present

## 2017-12-11 DIAGNOSIS — D62 Acute posthemorrhagic anemia: Secondary | ICD-10-CM | POA: Diagnosis not present

## 2017-12-11 DIAGNOSIS — Z23 Encounter for immunization: Secondary | ICD-10-CM

## 2017-12-11 DIAGNOSIS — Z8249 Family history of ischemic heart disease and other diseases of the circulatory system: Secondary | ICD-10-CM | POA: Diagnosis not present

## 2017-12-11 DIAGNOSIS — I4821 Permanent atrial fibrillation: Secondary | ICD-10-CM | POA: Diagnosis not present

## 2017-12-11 DIAGNOSIS — R791 Abnormal coagulation profile: Secondary | ICD-10-CM

## 2017-12-11 DIAGNOSIS — K5731 Diverticulosis of large intestine without perforation or abscess with bleeding: Principal | ICD-10-CM | POA: Diagnosis present

## 2017-12-11 DIAGNOSIS — K922 Gastrointestinal hemorrhage, unspecified: Secondary | ICD-10-CM

## 2017-12-11 DIAGNOSIS — N189 Chronic kidney disease, unspecified: Secondary | ICD-10-CM | POA: Diagnosis not present

## 2017-12-11 DIAGNOSIS — I4891 Unspecified atrial fibrillation: Secondary | ICD-10-CM | POA: Diagnosis not present

## 2017-12-11 DIAGNOSIS — K635 Polyp of colon: Secondary | ICD-10-CM | POA: Diagnosis present

## 2017-12-11 DIAGNOSIS — D509 Iron deficiency anemia, unspecified: Secondary | ICD-10-CM | POA: Diagnosis not present

## 2017-12-11 DIAGNOSIS — M170 Bilateral primary osteoarthritis of knee: Secondary | ICD-10-CM | POA: Diagnosis not present

## 2017-12-11 DIAGNOSIS — E876 Hypokalemia: Secondary | ICD-10-CM | POA: Diagnosis not present

## 2017-12-11 DIAGNOSIS — D649 Anemia, unspecified: Secondary | ICD-10-CM | POA: Diagnosis not present

## 2017-12-11 DIAGNOSIS — N184 Chronic kidney disease, stage 4 (severe): Secondary | ICD-10-CM | POA: Diagnosis not present

## 2017-12-11 DIAGNOSIS — E669 Obesity, unspecified: Secondary | ICD-10-CM | POA: Diagnosis present

## 2017-12-11 DIAGNOSIS — I5033 Acute on chronic diastolic (congestive) heart failure: Secondary | ICD-10-CM | POA: Diagnosis not present

## 2017-12-11 DIAGNOSIS — R531 Weakness: Secondary | ICD-10-CM | POA: Diagnosis not present

## 2017-12-11 DIAGNOSIS — R634 Abnormal weight loss: Secondary | ICD-10-CM

## 2017-12-11 DIAGNOSIS — M109 Gout, unspecified: Secondary | ICD-10-CM | POA: Diagnosis present

## 2017-12-11 DIAGNOSIS — D500 Iron deficiency anemia secondary to blood loss (chronic): Secondary | ICD-10-CM | POA: Diagnosis not present

## 2017-12-11 DIAGNOSIS — Z7901 Long term (current) use of anticoagulants: Secondary | ICD-10-CM | POA: Diagnosis not present

## 2017-12-11 DIAGNOSIS — I509 Heart failure, unspecified: Secondary | ICD-10-CM

## 2017-12-11 DIAGNOSIS — D508 Other iron deficiency anemias: Secondary | ICD-10-CM | POA: Diagnosis not present

## 2017-12-11 DIAGNOSIS — R0602 Shortness of breath: Secondary | ICD-10-CM | POA: Diagnosis not present

## 2017-12-11 LAB — COMPREHENSIVE METABOLIC PANEL
ALT: 10 U/L (ref 0–44)
AST: 25 U/L (ref 15–41)
Albumin: 2.9 g/dL — ABNORMAL LOW (ref 3.5–5.0)
Alkaline Phosphatase: 56 U/L (ref 38–126)
Anion gap: 9 (ref 5–15)
BUN: 25 mg/dL — AB (ref 8–23)
CHLORIDE: 113 mmol/L — AB (ref 98–111)
CO2: 19 mmol/L — ABNORMAL LOW (ref 22–32)
Calcium: 8.8 mg/dL — ABNORMAL LOW (ref 8.9–10.3)
Creatinine, Ser: 1.84 mg/dL — ABNORMAL HIGH (ref 0.44–1.00)
GFR calc Af Amer: 29 mL/min — ABNORMAL LOW (ref >60)
GFR, EST NON AFRICAN AMERICAN: 25 mL/min — AB (ref >60)
Glucose, Bld: 126 mg/dL — ABNORMAL HIGH (ref 70–99)
Potassium: 3.2 mmol/L — ABNORMAL LOW (ref 3.5–5.1)
SODIUM: 141 mmol/L (ref 135–145)
Total Bilirubin: 0.8 mg/dL (ref 0.3–1.2)
Total Protein: 7.4 g/dL (ref 6.5–8.1)

## 2017-12-11 LAB — CBC WITH DIFFERENTIAL/PLATELET
Abs Immature Granulocytes: 0.02 10*3/uL (ref 0.00–0.07)
BASOS ABS: 0 10*3/uL (ref 0.0–0.1)
BASOS PCT: 0 %
EOS ABS: 0 10*3/uL (ref 0.0–0.5)
Eosinophils Relative: 1 %
HCT: 17.9 % — ABNORMAL LOW (ref 36.0–46.0)
Hemoglobin: 5.1 g/dL — CL (ref 12.0–15.0)
IMMATURE GRANULOCYTES: 0 %
Lymphocytes Relative: 7 %
Lymphs Abs: 0.4 10*3/uL — ABNORMAL LOW (ref 0.7–4.0)
MCH: 23.5 pg — ABNORMAL LOW (ref 26.0–34.0)
MCHC: 28.5 g/dL — ABNORMAL LOW (ref 30.0–36.0)
MCV: 82.5 fL (ref 80.0–100.0)
MONOS PCT: 5 %
Monocytes Absolute: 0.3 10*3/uL (ref 0.1–1.0)
NEUTROS PCT: 87 %
NRBC: 0.7 % — AB (ref 0.0–0.2)
Neutro Abs: 5 10*3/uL (ref 1.7–7.7)
Platelets: 213 10*3/uL (ref 150–400)
RBC: 2.17 MIL/uL — AB (ref 3.87–5.11)
RDW: 18.3 % — AB (ref 11.5–15.5)
WBC: 5.7 10*3/uL (ref 4.0–10.5)

## 2017-12-11 LAB — IRON AND TIBC
IRON: 11 ug/dL — AB (ref 28–170)
SATURATION RATIOS: 4 % — AB (ref 10.4–31.8)
TIBC: 307 ug/dL (ref 250–450)
UIBC: 296 ug/dL

## 2017-12-11 LAB — I-STAT TROPONIN, ED: TROPONIN I, POC: 0.03 ng/mL (ref 0.00–0.08)

## 2017-12-11 LAB — POC OCCULT BLOOD, ED: Fecal Occult Bld: POSITIVE — AB

## 2017-12-11 LAB — ABO/RH: ABO/RH(D): A POS

## 2017-12-11 LAB — PROTIME-INR
INR: 4.46
Prothrombin Time: 41.7 seconds — ABNORMAL HIGH (ref 11.4–15.2)

## 2017-12-11 LAB — RETICULOCYTES
Immature Retic Fract: 21.7 % — ABNORMAL HIGH (ref 2.3–15.9)
RBC.: 2.08 MIL/uL — ABNORMAL LOW (ref 3.87–5.11)
Retic Count, Absolute: 42.2 10*3/uL (ref 19.0–186.0)
Retic Ct Pct: 2 % (ref 0.4–3.1)

## 2017-12-11 LAB — PREPARE RBC (CROSSMATCH)

## 2017-12-11 LAB — FERRITIN: FERRITIN: 9 ng/mL — AB (ref 11–307)

## 2017-12-11 LAB — FOLATE: FOLATE: 11.8 ng/mL (ref >5.9)

## 2017-12-11 LAB — I-STAT CG4 LACTIC ACID, ED: Lactic Acid, Venous: 1 mmol/L (ref 0.5–1.9)

## 2017-12-11 LAB — BRAIN NATRIURETIC PEPTIDE: B Natriuretic Peptide: 851.6 pg/mL — ABNORMAL HIGH (ref 0.0–100.0)

## 2017-12-11 LAB — VITAMIN B12: Vitamin B-12: 341 pg/mL (ref 180–914)

## 2017-12-11 MED ORDER — AMLODIPINE BESYLATE 10 MG PO TABS
10.0000 mg | ORAL_TABLET | Freq: Every day | ORAL | Status: DC
  Administered 2017-12-11 – 2017-12-19 (×9): 10 mg via ORAL
  Filled 2017-12-11 (×2): qty 2
  Filled 2017-12-11 (×3): qty 1
  Filled 2017-12-11 (×4): qty 2

## 2017-12-11 MED ORDER — SODIUM CHLORIDE 0.9% IV SOLUTION
Freq: Once | INTRAVENOUS | Status: AC
  Administered 2017-12-11: 14:00:00 via INTRAVENOUS

## 2017-12-11 MED ORDER — HYDRALAZINE HCL 50 MG PO TABS
100.0000 mg | ORAL_TABLET | Freq: Three times a day (TID) | ORAL | Status: DC
  Administered 2017-12-11 – 2017-12-19 (×24): 100 mg via ORAL
  Filled 2017-12-11 (×25): qty 2

## 2017-12-11 MED ORDER — CARVEDILOL 25 MG PO TABS
25.0000 mg | ORAL_TABLET | Freq: Two times a day (BID) | ORAL | Status: DC
  Administered 2017-12-11 – 2017-12-19 (×16): 25 mg via ORAL
  Filled 2017-12-11 (×16): qty 1

## 2017-12-11 MED ORDER — FUROSEMIDE 10 MG/ML IJ SOLN
40.0000 mg | Freq: Two times a day (BID) | INTRAMUSCULAR | Status: AC
  Administered 2017-12-11 – 2017-12-12 (×2): 40 mg via INTRAVENOUS
  Filled 2017-12-11 (×2): qty 4

## 2017-12-11 MED ORDER — INFLUENZA VAC SPLIT HIGH-DOSE 0.5 ML IM SUSY
0.5000 mL | PREFILLED_SYRINGE | INTRAMUSCULAR | Status: AC
  Administered 2017-12-12: 0.5 mL via INTRAMUSCULAR
  Filled 2017-12-11: qty 0.5

## 2017-12-11 MED ORDER — POTASSIUM CHLORIDE CRYS ER 20 MEQ PO TBCR
40.0000 meq | EXTENDED_RELEASE_TABLET | Freq: Once | ORAL | Status: AC
  Administered 2017-12-11: 40 meq via ORAL
  Filled 2017-12-11: qty 2

## 2017-12-11 MED ORDER — VITAMIN K1 10 MG/ML IJ SOLN
5.0000 mg | Freq: Once | INTRAVENOUS | Status: AC
  Administered 2017-12-11: 5 mg via INTRAVENOUS
  Filled 2017-12-11: qty 0.5

## 2017-12-11 MED ORDER — SODIUM CHLORIDE 0.9 % IV BOLUS
500.0000 mL | Freq: Once | INTRAVENOUS | Status: AC
  Administered 2017-12-11: 500 mL via INTRAVENOUS

## 2017-12-11 MED ORDER — FUROSEMIDE 40 MG PO TABS
20.0000 mg | ORAL_TABLET | Freq: Every day | ORAL | Status: DC
  Administered 2017-12-13: 20 mg via ORAL
  Administered 2017-12-14: 11:00:00 via ORAL
  Filled 2017-12-11 (×2): qty 1

## 2017-12-11 NOTE — Consult Note (Addendum)
Cardiology Consultation:   Patient ID: Jennifer Hinton MRN: [id number]; DOB: 1935/12/31  Admit date: 12/11/2017 Date of Consult: 12/11/2017  Primary Care Provider: [name], MD Primary Cardiologist: Dr Stanford Breed  Patient Profile:   Jennifer Hinton is a 82 y.o. female with a hx of persistent atrial fibrillation on chronic anticoagulation with coumadin who is being seen today for the evaluation of acute GIB at the request of Milton Ferguson, MD.  History of Present Illness:   Jennifer Hinton is a 82 y.o. female with PMH of HTN, HLD, chronic diastolic heart failure, severe pulmonary hypertension, RA, and history of long standing atrial fibrillation.  Echocardiogram in April 2016 showed normal LV function EF 60% moderate to severe LVH, moderate MR, mild aortic regurgitation, severe LAE, severe RAE, severe TR, severely elevated pulmonary pressure and a small pericardial effusion.  She had a history of uncontrolled high blood pressure.  Her last office visit with Dr. Stanford Breed was on 05/09/2015, last seen in our clinic on 05/27/17 when she had supra therapeutic INR 3.8 but Hb of 10.3. Coumadine dose has been adjusted and within 2-3 range until todays' visit - INR 4.46. She presented to the ER with weakness, fatigue for few weeks, also progressively worsening dyspnea.  She was found to have INR 4.46 and Hb 5.1, Hct 17.9.    Past Medical History:  Diagnosis Date  . Atrial fibrillation ([place])   . CHF (congestive heart failure) ([place])   . Dysrhythmia    A FIB   . Hyperlipidemia   . Hypertension   . RA (rheumatoid arthritis) ([place])     Past Surgical History:  Procedure Laterality Date  . NO PAST SURGERIES     Inpatient Medications: Scheduled Meds: . sodium chloride   Intravenous Once  . sodium chloride   Intravenous Once  . amLODipine  10 mg Oral Daily  . carvedilol  25 mg Oral BID WC  . furosemide  20 mg Oral Daily  . hydrALAZINE  100 mg Oral TID  . potassium chloride SA  40 mEq Oral Once     Continuous Infusions: . phytonadione (VITAMIN K) IV 5 mg (12/11/17 1228)   PRN Meds:  Allergies:   No Known Allergies  Social History:   Social History   Socioeconomic History  . Marital status: Widowed    Spouse name: Not on file  . Number of children: 6  . Years of education: 9th grade   . Highest education level: Not on file  Occupational History  . 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 Financial resource strain: Not on file  . Food insecurity:    Worry: Not on file    Inability: Not on file  . Transportation needs:    Medical: Not on file    Non-medical: Not on file  Tobacco Use  . Smoking status: Never Smoker  . Smokeless tobacco: Never Used  Substance and Sexual Activity  . Alcohol use: No  . Drug use: No  . Sexual activity: Not on file  Lifestyle  . Physical activity:    Days per week: Not on file    Minutes per session: Not on file  . Stress: Not on file  Relationships  . Social connections:    Talks on phone: Not on file    Gets together: Not on file    Attends religious  service: Not on file    Active member of club or organization: Not on file    Attends meetings of clubs or organizations: Not on file    Relationship status: Not on file  . Intimate partner violence:    Fear of current or ex partner: Not on file    Emotionally abused: Not on file    Physically abused: Not on file    Forced sexual activity: Not on file  Other Topics Concern  . Not on file  Social History Narrative   Had 6 children.   5 children living (oldest daughter passed away).    2 living in [place], 2 living in [place], 1 living in [place].       Raising your 38 yo great grandson Dorris Fetch, also a clinic patient).    Drives. Recently renewed license. Does not own a car so transportation is sometimes a limiting factor.              Family History:    Family History   Problem Relation Age of Onset  . Heart disease Brother   . Hyperlipidemia Brother     ROS:  Please see the history of present illness.  All other ROS reviewed and negative.     Physical Exam/Data:   Vitals:   12/11/17 0932 12/11/17 1026 12/11/17 1130 12/11/17 1230  BP:  134/76 (!) 151/74 (!) 150/77  Pulse:  81 86 87  Resp:   (!) 23 18  Temp:      TempSrc:      SpO2:  99% 99% 98%  Weight: 64.9 kg     Height: 5\' 4"  (1.626 m)       Intake/Output Summary (Last 24 hours) at 12/11/2017 1317 Last data filed at 12/11/2017 1209 Gross per 24 hour  Intake 500 ml  Output -  Net 500 ml   Filed Weights   12/11/17 0932  Weight: 64.9 kg   Body mass index is 24.55 kg/m.  General:  Well nourished, well developed, in no acute distress HEENT: normal Lymph: no adenopathy Neck: no JVD Endocrine:  No thryomegaly Vascular: No carotid bruits; FA pulses 2+ bilaterally without bruits  Cardiac:  S1, S2; iRRR; 3/6 systolic murmur  Lungs:  Crackles at lung bases to auscultation bilaterally, no wheezing Abd: soft, nontender, no hepatomegaly  Ext: mild B/L edema Musculoskeletal:  No deformities, BUE and BLE strength normal and equal Skin: warm and dry  Neuro:  CNs 2-12 intact, no focal abnormalities noted Psych:  Normal affect   EKG:  The EKG was personally reviewed and demonstrates:  A-fib, rate controlled, non-specific ST-T wave abnormalities in the inferolateral leads and PVCs- new changes. Telemetry:  Telemetry was personally reviewed and demonstrates:  A-fib  Relevant CV Studies: TTE: 06/10/2017 - Left ventricle: The cavity size was normal. Wall thickness was   increased in a pattern of moderate LVH. Systolic function was   normal. The estimated ejection fraction was in the range of 60%   to 65%. Wall motion was normal; there were no regional wall   motion abnormalities. The study is not technically sufficient to   allow evaluation of LV diastolic function. - Aortic valve:  Moderately calcified annulus. Trileaflet. There was   mild regurgitation. - Mitral valve: Calcified annulus. Mildly thickened leaflets .   There was moderate regurgitation. - Left atrium: The atrium was massively dilated. - Right atrium: The atrium was massively dilated. - Atrial septum: No defect or patent foramen ovale was identified. -  Tricuspid valve: There was severe regurgitation. - Pulmonic valve: There was mild regurgitation. - Pulmonary arteries: PA peak pressure: 77 mm Hg (S). - Systemic veins: IVC is significantly dilated with normal   respiratory variation. Estimated CVP 8 mmHg.  Laboratory Data:  Chemistry Recent Labs  Lab 12/11/17 1034  NA 141  K 3.2*  CL 113*  CO2 19*  GLUCOSE 126*  BUN 25*  CREATININE 1.84*  CALCIUM 8.8*  GFRNONAA 25*  GFRAA 29*  ANIONGAP 9    Recent Labs  Lab 12/11/17 1034  PROT 7.4  ALBUMIN 2.9*  AST 25  ALT 10  ALKPHOS 56  BILITOT 0.8   Hematology Recent Labs  Lab 12/11/17 1034  WBC 5.7  RBC 2.17*  HGB 5.1*  HCT 17.9*  MCV 82.5  MCH 23.5*  MCHC 28.5*  RDW 18.3*  PLT 213   Cardiac EnzymesNo results for input(s): TROPONINI in the last 168 hours.  Recent Labs  Lab 12/11/17 1037  TROPIPOC 0.03    BNP Recent Labs  Lab 12/11/17 1034  BNP 851.6*    DDimer No results for input(s): DDIMER in the last 168 hours.  Radiology/Studies:  Dg Chest 2 View  Result Date: 12/11/2017 CLINICAL DATA:  Fatigue, shortness of breath, loss of appetite. EXAM: CHEST - 2 VIEW COMPARISON:  06/04/2014 FINDINGS: Markedly enlarged cardiac silhouette. Calcific atherosclerotic disease of the aorta. Mediastinal contours appear intact. No evidence of pneumothorax. Mild interstitial pulmonary edema. Small bilateral pleural effusions. Scattered peribronchial lower lobe atelectasis versus airspace consolidation. Osseous structures are without acute abnormality. Soft tissues are grossly normal. IMPRESSION: Small bilateral pleural effusions with mild  interstitial pulmonary edema. Scattered peribronchial lower lobe atelectasis versus airspace consolidation. Markedly enlarged cardiac silhouette. This may be due to congestive heart failure, cardiomyopathy or pericardial effusion. Electronically Signed   By: Fidela Salisbury M.D.   On: 12/11/2017 11:16    Assessment and Plan:   1. Acute GIB - positive hemoccult in the settings of supra therapeutic INR - ok to hold coumadin, give vitamin K 5 mg po x 1 - call GI consult - she is hemodynamically stable   2. Permanent atrial fibrillation:  - rate controlled with carvedilol 25 mg po BID - hold coumadin  3. Severe TR, severe pulmonary hypertension, moderate MR   4. Acute on Chronic diastolic heart failure:  - in the settings of severe anemia   - BNP 850, Crea 1.8, baseline 1.0-1.5, we will follow closely, we will replace potassium - start lasix 40 mg iv BID x 2 doses, I will re-evaluate in the am   5. Hypertension  For questions or updates, please contact [place] Please consult [email or web address] for contact info under   Signed, Ena Dawley, MD  12/11/2017 1:17 PM

## 2017-12-11 NOTE — ED Notes (Signed)
PENDING PRBC TRANSFUSING AND SCHEDULED MEDICATIONS. AWARE CARDIOLOGY AT BEDSIDE AND MAY WRITE ORDERS.

## 2017-12-11 NOTE — ED Notes (Signed)
CRITICAL VALUE ALERT  Critical Value:  INR-4.46  Date & Time Notied:  12/11/17, 1135  Provider Notified: Roderic Palau  Orders Received/Actions taken: Provider Aware

## 2017-12-11 NOTE — ED Notes (Signed)
ROYCE RN AT BEDSIDE. ATTEMPTING ULTRASOUND IV

## 2017-12-11 NOTE — ED Provider Notes (Signed)
[facility] DEPT Provider Note   CSN: [id number] Arrival date & time: 12/11/17  5102     History   Chief Complaint Chief Complaint  Patient presents with  . Fatigue  . not eating  . Shortness of Breath    HPI Jennifer Hinton is a 82 y.o. female.  Patient complains of being weak for the last couple of weeks.  She has history of atrial fib and takes Coumadin  The history is provided by the patient. No language interpreter was used.  Weakness  Primary symptoms include no visual change. This is a new problem. The current episode started more than 1 week ago. The problem has not changed since onset.There was no focality noted. There has been no fever. Associated symptoms include shortness of breath. Pertinent negatives include no chest pain and no headaches. There were no medications administered prior to arrival. Associated medical issues do not include trauma.    Past Medical History:  Diagnosis Date  . Atrial fibrillation ([place])   . CHF (congestive heart failure) ([place])   . Dysrhythmia    A FIB   . Hyperlipidemia   . Hypertension   . RA (rheumatoid arthritis) [facility])     Patient Active Problem List   Diagnosis Date Noted  . Encounter for therapeutic drug monitoring 06/10/2017  . Healthcare maintenance 10/17/2015  . Post-menopause 10/17/2015  . Hemarthrosis 12/19/2014  . Chronic diastolic congestive heart failure ([place]) 10/03/2014  . Essential hypertension, benign 06/14/2014  . Anemia 06/14/2014  . Knee pain 06/14/2014  . Hypertensive cardiovascular disease- LVH 06/06/2014  . Pulmonary hypertension-severe 06/06/2014  . Tricuspid regurgitation-severe 06/06/2014  . Chronic renal disease, stage III ([place]) 06/06/2014  . Malnutrition of moderate degree ([place]) 06/06/2014  . Acute on chronic diastolic congestive heart failure ([place])   . Dyspnea 03/09/2014  . Falls 03/09/2014  . Tinnitus 10/05/2012  . Encounter for long-term (current) use of  anticoagulants 05/23/2010  . LIPOMA 11/30/2008  . PROTEINURIA 11/30/2008  . GOUT, UNSPECIFIED 06/27/2008  . Essential hypertension 12/17/2006  . Dyslipidemia 04/17/2006  . OBESITY, NOS 04/17/2006  . Permanent atrial fibrillation 04/17/2006  . Osteoarthrosis, unspecified whether generalized or localized, involving lower leg 04/17/2006    Past Surgical History:  Procedure Laterality Date  . NO PAST SURGERIES       OB History   None      Home Medications    Prior to Admission medications   Medication Sig Start Date End Date Taking? Authorizing Provider  amLODipine (NORVASC) 10 MG tablet TAKE 1 TABLET BY MOUTH EVERY DAY Patient taking differently: Take 10 mg by mouth daily.  04/10/17  Yes Lelon Perla, MD  carvedilol (COREG) 25 MG tablet TAKE 1 TABLET BY MOUTH TWICE A DAY WITH A MEAL, Patient taking differently: Take 25 mg by mouth 2 (two) times daily with a meal. TAKE 1 TABLET BY MOUTH TWICE A DAY WITH A MEAL, 05/27/17  Yes Almyra Deforest, PA  furosemide (LASIX) 20 MG tablet Take 1 tablet (20 mg total) by mouth daily. 05/27/17  Yes Almyra Deforest, PA  hydrALAZINE (APRESOLINE) 100 MG tablet Take 1 tablet (100 mg total) by mouth 3 (three) times daily. 05/27/17  Yes Almyra Deforest, PA  warfarin (COUMADIN) 5 MG tablet Take 1/2 to 1 tablet by mouth daily as directed by coumadin clinic 07/07/17  Yes Lelon Perla, MD  acetaminophen (TYLENOL) 650 MG CR tablet One tab by mouth q4-6h as needed pain in knee     [provider]    Family History Family History  Problem Relation Age of Onset  . Heart disease Brother   . Hyperlipidemia Brother     Social History Social History   Tobacco Use  . Smoking status: Never Smoker  . Smokeless tobacco: Never Used  Substance Use Topics  . Alcohol use: No  . Drug use: No     Allergies   Patient has no known allergies.   Review of Systems Review of Systems  Constitutional: Negative for appetite change and fatigue.  HENT: Negative for  congestion, ear discharge and sinus pressure.   Eyes: Negative for discharge.  Respiratory: Positive for shortness of breath. Negative for cough.   Cardiovascular: Negative for chest pain.  Gastrointestinal: Negative for abdominal pain and diarrhea.  Genitourinary: Negative for frequency and hematuria.  Musculoskeletal: Negative for back pain.  Skin: Negative for rash.  Neurological: Positive for weakness. Negative for seizures and headaches.  Psychiatric/Behavioral: Negative for hallucinations.     Physical Exam Updated Vital Signs BP 134/76   Pulse 81   Temp 98.2 F (36.8 C) (Oral)   Resp 18   Ht 5\' 4"  (1.626 m)   Wt 64.9 kg   SpO2 99%   BMI 24.55 kg/m   Physical Exam  Constitutional: She is oriented to person, place, and time. She appears well-developed.  HENT:  Head: Normocephalic.  Eyes: Conjunctivae and EOM are normal. No scleral icterus.  Neck: Neck supple. No thyromegaly present.  Cardiovascular: Normal rate and regular rhythm. Exam reveals no gallop and no friction rub.  No murmur heard. Pulmonary/Chest: No stridor. She has no wheezes. She has no rales. She exhibits no tenderness.  Abdominal: She exhibits no distension. There is no tenderness. There is no rebound.  Genitourinary:  Genitourinary Comments: Rectal exam brown stool weakly heme positive  Musculoskeletal: Normal range of motion. She exhibits no edema.  Lymphadenopathy:    She has no cervical adenopathy.  Neurological: She is oriented to person, place, and time. She exhibits normal muscle tone. Coordination normal.  Skin: No rash noted. No erythema.  Psychiatric: She has a normal mood and affect. Her behavior is normal.     ED Treatments / Results  Labs (all labs ordered are listed, but only abnormal results are displayed) Labs Reviewed  CBC WITH DIFFERENTIAL/PLATELET - Abnormal; Notable for the following components:      Result Value   RBC 2.17 (*)    Hemoglobin 5.1 (*)    HCT 17.9 (*)     MCH 23.5 (*)    MCHC 28.5 (*)    RDW 18.3 (*)    nRBC 0.7 (*)    Lymphs Abs 0.4 (*)    All other components within normal limits  COMPREHENSIVE METABOLIC PANEL - Abnormal; Notable for the following components:   Potassium 3.2 (*)    Chloride 113 (*)    CO2 19 (*)    Glucose, Bld 126 (*)    BUN 25 (*)    Creatinine, Ser 1.84 (*)    Calcium 8.8 (*)    Albumin 2.9 (*)    GFR calc non Af Amer 25 (*)    GFR calc Af Amer 29 (*)    All other components within normal limits  BRAIN NATRIURETIC PEPTIDE - Abnormal; Notable for the following components:   B Natriuretic Peptide 851.6 (*)    All other components within normal limits  PROTIME-INR - Abnormal; Notable for the following components:   Prothrombin Time 41.7 (*)  INR 4.46 (*)    All other components within normal limits  POC OCCULT BLOOD, ED - Abnormal; Notable for the following components:   Fecal Occult Bld POSITIVE (*)    All other components within normal limits  URINALYSIS, ROUTINE W REFLEX MICROSCOPIC  VITAMIN B12  FOLATE  IRON AND TIBC  FERRITIN  RETICULOCYTES  OCCULT BLOOD X 1 CARD TO LAB, STOOL  I-STAT CG4 LACTIC ACID, ED  I-STAT TROPONIN, ED  I-STAT CG4 LACTIC ACID, ED  TYPE AND SCREEN  PREPARE RBC (CROSSMATCH)    EKG None  Radiology Dg Chest 2 View  Result Date: 12/11/2017 CLINICAL DATA:  Fatigue, shortness of breath, loss of appetite. EXAM: CHEST - 2 VIEW COMPARISON:  06/04/2014 FINDINGS: Markedly enlarged cardiac silhouette. Calcific atherosclerotic disease of the aorta. Mediastinal contours appear intact. No evidence of pneumothorax. Mild interstitial pulmonary edema. Small bilateral pleural effusions. Scattered peribronchial lower lobe atelectasis versus airspace consolidation. Osseous structures are without acute abnormality. Soft tissues are grossly normal. IMPRESSION: Small bilateral pleural effusions with mild interstitial pulmonary edema. Scattered peribronchial lower lobe atelectasis versus airspace  consolidation. Markedly enlarged cardiac silhouette. This may be due to congestive heart failure, cardiomyopathy or pericardial effusion. Electronically Signed   By: Fidela Salisbury M.D.   On: 12/11/2017 11:16    Procedures Procedures (including critical care time)  Medications Ordered in ED Medications  sodium chloride 0.9 % bolus 500 mL (500 mLs Intravenous New Bag/Given (Non-Interop) 12/11/17 1108)  0.9 %  sodium chloride infusion (Manually program via Guardrails IV Fluids) (has no administration in time range)  phytonadione (VITAMIN K) 5 mg in dextrose 5 % 50 mL IVPB (has no administration in time range)  potassium chloride SA (K-DUR,KLOR-CON) CR tablet 40 mEq (has no administration in time range)     Initial Impression / Assessment and Plan / ED Course  I have reviewed the triage vital signs and the nursing notes.  Pertinent labs & imaging results that were available during my care of the patient were reviewed by me and considered in my medical decision making (see chart for details).     .edcr CRITICAL CARE Performed by: Milton Ferguson Total critical care time: 40 minutes Critical care time was exclusive of separately billable procedures and treating other patients. Critical care was necessary to treat or prevent imminent or life-threatening deterioration. Critical care was time spent personally by me on the following activities: development of treatment plan with patient and/or surrogate as well as nursing, discussions with consultants, evaluation of patient's response to treatment, examination of patient, obtaining history from patient or surrogate, ordering and performing treatments and interventions, ordering and review of laboratory studies, ordering and review of radiographic studies, pulse oximetry and re-evaluation of patient's condition.\ Patient with anemia that is symptomatic.  And heme positive stools.  Cardiology has consulted and we will give the patient vitamin K  for her elevated PT.  She will be transfused packed red blood cells and admitted to medicine Final Clinical Impressions(s) / ED Diagnoses   Final diagnoses:  Weakness    ED Discharge Orders    None       Milton Ferguson, MD 12/11/17 1210

## 2017-12-11 NOTE — ED Notes (Signed)
ATTEMPTED 2ND IV WITHOUT SUCCESS. REQUESTED ASSISTANCE. EMILY AT BEDSIDE

## 2017-12-11 NOTE — ED Notes (Signed)
[place] EMT

## 2017-12-11 NOTE — ED Triage Notes (Addendum)
Patient c/o fatigue, SOB, and not eating x 4 days. Patient has a history of CHF.  Patient states she quit taking her fluid pill last week because it was making her sick.

## 2017-12-11 NOTE — ED Notes (Signed)
Date and time results received: 12/11/17   (use smartphrase ".now" to insert current time)  Test: HGB Critical Value: 5.1  Name of Provider Notified: EDP ZAMMIT  Orders Received AWAITING RESPONDSE

## 2017-12-11 NOTE — ED Notes (Signed)
STACEY W RN AT BEDSIDE ATTEMPTING IV . NOT SUCCESSFUL.

## 2017-12-11 NOTE — Progress Notes (Signed)
Unable to get in touch with ED staff.

## 2017-12-11 NOTE — ED Notes (Signed)
Patient transported to X-ray 

## 2017-12-11 NOTE — ED Notes (Signed)
ED Provider at bedside. 

## 2017-12-11 NOTE — H&P (Addendum)
History and Physical    Jennifer Hinton CHE:[id number] DOB: [birth year] DOA: 12/11/2017  PCP: [name], MD  Patient coming from: home  Chief Complaint: generalized weakness  HPI: Jennifer Hinton is a 82 y.o. female with medical history significant of diastolic congestive heart failure last echo April 2019 normal EF, atrial fibrillation, hypertension admitted with generalized weakness and dyspnea on exertion.  Patient reports is been going on for over 1 week.  The family reports her appetite has been also low she is not lost weight.  She denies any chest pain fever chills or cough.  She denies any abdominal pain nausea vomiting diarrhea or constipation.  She denies urinary complaints.  She has never had blood transfusion in the past.  She has been on Coumadin since 2002.  INR has been within range for many years.  She denies hematuria, melena, hematochezia.  She lives at home with her granddaughter. ED Course: She was found to have a hemoglobin of 5.1 white count 5.7 platelet count 213 sodium 141 potassium 3.2 BUN 25 creatinine 1.84, last creatinine from April 2019 it was 1.50.  Blood transfusion was ordered vital signs are stable.  Review of Systems: See HPI  Past Medical History:  Diagnosis Date  . Atrial fibrillation ([place])   . CHF (congestive heart failure) ([place])   . Dysrhythmia    A FIB   . Hyperlipidemia   . Hypertension   . RA (rheumatoid arthritis) ([place])     Past Surgical History:  Procedure Laterality Date  . NO PAST SURGERIES       reports that she has never smoked. She has never used smokeless tobacco. She reports that she does not drink alcohol or use drugs.  No Known Allergies  Family History  Problem Relation Age of Onset  . Heart disease Brother   . Hyperlipidemia Brother     Prior to Admission medications   Medication Sig Start Date End Date Taking? Authorizing Provider  amLODipine (NORVASC) 10 MG tablet TAKE 1 TABLET BY MOUTH EVERY DAY Patient taking  differently: Take 10 mg by mouth daily.  04/10/17  Yes Lelon Perla, MD  carvedilol (COREG) 25 MG tablet TAKE 1 TABLET BY MOUTH TWICE A DAY WITH A MEAL, Patient taking differently: Take 25 mg by mouth 2 (two) times daily with a meal. TAKE 1 TABLET BY MOUTH TWICE A DAY WITH A MEAL, 05/27/17  Yes Almyra Deforest, PA  furosemide (LASIX) 20 MG tablet Take 1 tablet (20 mg total) by mouth daily. 05/27/17  Yes Almyra Deforest, PA  hydrALAZINE (APRESOLINE) 100 MG tablet Take 1 tablet (100 mg total) by mouth 3 (three) times daily. 05/27/17  Yes Almyra Deforest, PA  warfarin (COUMADIN) 5 MG tablet Take 1/2 to 1 tablet by mouth daily as directed by coumadin clinic 07/07/17  Yes Lelon Perla, MD  acetaminophen (TYLENOL) 650 MG CR tablet One tab by mouth q4-6h as needed pain in knee     [provider]    Physical Exam: Vitals:   12/11/17 0931 12/11/17 0932 12/11/17 1026  BP: (!) 146/76  134/76  Pulse: 88  81  Resp: 18    Temp: 98.2 F (36.8 C)    TempSrc: Oral    SpO2: 94%  99%  Weight:  64.9 kg   Height:  5\' 4"  (1.626 m)     Constitutional: NAD, calm, comfortable Vitals:   12/11/17 0931 12/11/17 0932 12/11/17 1026  BP: (!) 146/76  134/76  Pulse: 88  81  Resp: 18    Temp: 98.2 F (36.8 C)    TempSrc: Oral    SpO2: 94%  99%  Weight:  64.9 kg   Height:  5\' 4"  (1.626 m)    Eyes: PERRL, lids and conjunctivae pale ENMT: Mucous membranes are moist. Posterior pharynx clear of any exudate or lesions.Normal dentition.  Neck: normal, supple, no masses, no thyromegaly Respiratory: clear to auscultation bilaterally, no wheezing, no crackles. Normal respiratory effort. No accessory muscle use.  Cardiovascular: Regular rate and rhythm, systolic  murmurs / rubs / gallops. No extremity edema. 2+ pedal pulses. No carotid bruits.  Abdomen: no tenderness, no masses palpated. No hepatosplenomegaly. Bowel sounds positive.  Musculoskeletal: 1+ pitting edema bilateral lower extremity Skin: no rashes, lesions,  ulcers. No induration Neurologic: CN 2-12 grossly intact. Sensation intact, DTR normal. Strength 5/5 in all 4.  Psychiatric: Normal judgment and insight. Alert and oriented x 3. Normal mood.   Labs on Admission: I have personally reviewed following labs and imaging studies  CBC: Recent Labs  Lab 12/11/17 1034  WBC 5.7  NEUTROABS 5.0  HGB 5.1*  HCT 17.9*  MCV 82.5  PLT 240   Basic Metabolic Panel: Recent Labs  Lab 12/11/17 1034  NA 141  K 3.2*  CL 113*  CO2 19*  GLUCOSE 126*  BUN 25*  CREATININE 1.84*  CALCIUM 8.8*   GFR: Estimated Creatinine Clearance: 21.4 mL/min (A) (by C-G formula based on SCr of 1.84 mg/dL (H)). Liver Function Tests: Recent Labs  Lab 12/11/17 1034  AST 25  ALT 10  ALKPHOS 56  BILITOT 0.8  PROT 7.4  ALBUMIN 2.9*   No results for input(s): LIPASE, AMYLASE in the last 168 hours. No results for input(s): AMMONIA in the last 168 hours. Coagulation Profile: Recent Labs  Lab 12/11/17 1034  INR 4.46*   Cardiac Enzymes: No results for input(s): CKTOTAL, CKMB, CKMBINDEX, TROPONINI in the last 168 hours. BNP (last 3 results) No results for input(s): PROBNP in the last 8760 hours. HbA1C: No results for input(s): HGBA1C in the last 72 hours. CBG: No results for input(s): GLUCAP in the last 168 hours. Lipid Profile: No results for input(s): CHOL, HDL, LDLCALC, TRIG, CHOLHDL, LDLDIRECT in the last 72 hours. Thyroid Function Tests: No results for input(s): TSH, T4TOTAL, FREET4, T3FREE, THYROIDAB in the last 72 hours. Anemia Panel: No results for input(s): VITAMINB12, FOLATE, FERRITIN, TIBC, IRON, RETICCTPCT in the last 72 hours. Urine analysis:    Component Value Date/Time   COLORURINE yellow 11/30/2008 0829   APPEARANCEUR Clear 11/30/2008 0829   LABSPEC 1.020 11/30/2008 0829   PHURINE 7.0 11/30/2008 0829   HGBUR trace-intact 11/30/2008 0829   BILIRUBINUR negative 11/30/2008 0829   UROBILINOGEN 0.2 11/30/2008 0829   NITRITE negative  11/30/2008 0829    Radiological Exams on Admission: Dg Chest 2 View  Result Date: 12/11/2017 CLINICAL DATA:  Fatigue, shortness of breath, loss of appetite. EXAM: CHEST - 2 VIEW COMPARISON:  06/04/2014 FINDINGS: Markedly enlarged cardiac silhouette. Calcific atherosclerotic disease of the aorta. Mediastinal contours appear intact. No evidence of pneumothorax. Mild interstitial pulmonary edema. Small bilateral pleural effusions. Scattered peribronchial lower lobe atelectasis versus airspace consolidation. Osseous structures are without acute abnormality. Soft tissues are grossly normal. IMPRESSION: Small bilateral pleural effusions with mild interstitial pulmonary edema. Scattered peribronchial lower lobe atelectasis versus airspace consolidation. Markedly enlarged cardiac silhouette. This may be due to congestive heart failure, cardiomyopathy or pericardial effusion. Electronically Signed   By: Fidela Salisbury M.D.   On:  12/11/2017 11:16    EKG: Independently reviewed.   Assessment/Plan Active Problems:   * No active hospital problems. *   #1 symptomatic anemia/diastolic CHF- patient admitted with complaints of generalized weakness and dyspnea on exertion and shortness of breath.  She was found to have a hemoglobin of 5.1 in the ER.  ED physician reported FOBT is weakly positive.  She received vitamin K 5 mg for an INR of 4.6.  Blood transfusion for 2 units have been ordered.  ED physician has also spoke with Dr. Meda Coffee from cardiology seeing the patient today.  She will be admitted to telemetry transfuse 2 units recheck labs tomorrow morning.  She denied any chest pain her troponin was 0.03.  EKG showed no acute changes.  Chest x-ray showed small bilateral pleural effusion with mild interstitial pulmonary edema.  She takes Lasix 20 mg daily at home which I will be giving between 2 transfusions.  Will need to adjust the Coumadin dose upon discharge.  Recheck FOBT.  Anemia panel has been sent  prior to transfusion.  #2 history of atrial fibrillation hold Coumadin restart Coreg  #3 hypertension continue Norvasc Coreg Lasix and hydralazine.  #4  CKD stage IV-monitor closely.   DVT prophylaxis: SCD Code Status: Full code Family Communication: Discussed with daughter who was in the room Disposition Plan: Pending clinical improvement Consults called: ED physician called cardiology consult Admission status: Observation  Georgette Shell MD [facility]  If 7PM-7AM, please contact night-coverage [email or web address] Password [email or web address]  12/11/2017, 12:25 PM

## 2017-12-11 NOTE — ED Notes (Addendum)
Date and time results received: 12/11/17  (use smartphrase ".now" to insert current time)  Test: INR Critical Value: 4.46  Name of Provider Notified: EDP ZAMMIT  Orders Received? Or Actions Taken?: AWAITING RESPONSE

## 2017-12-12 DIAGNOSIS — D509 Iron deficiency anemia, unspecified: Secondary | ICD-10-CM | POA: Diagnosis not present

## 2017-12-12 DIAGNOSIS — D62 Acute posthemorrhagic anemia: Secondary | ICD-10-CM | POA: Diagnosis present

## 2017-12-12 DIAGNOSIS — Z8349 Family history of other endocrine, nutritional and metabolic diseases: Secondary | ICD-10-CM | POA: Diagnosis not present

## 2017-12-12 DIAGNOSIS — Z8249 Family history of ischemic heart disease and other diseases of the circulatory system: Secondary | ICD-10-CM | POA: Diagnosis not present

## 2017-12-12 DIAGNOSIS — M069 Rheumatoid arthritis, unspecified: Secondary | ICD-10-CM | POA: Diagnosis present

## 2017-12-12 DIAGNOSIS — N183 Chronic kidney disease, stage 3 (moderate): Secondary | ICD-10-CM | POA: Diagnosis not present

## 2017-12-12 DIAGNOSIS — I272 Pulmonary hypertension, unspecified: Secondary | ICD-10-CM | POA: Diagnosis present

## 2017-12-12 DIAGNOSIS — N184 Chronic kidney disease, stage 4 (severe): Secondary | ICD-10-CM | POA: Diagnosis not present

## 2017-12-12 DIAGNOSIS — I5033 Acute on chronic diastolic (congestive) heart failure: Secondary | ICD-10-CM | POA: Diagnosis not present

## 2017-12-12 DIAGNOSIS — R531 Weakness: Secondary | ICD-10-CM | POA: Diagnosis not present

## 2017-12-12 DIAGNOSIS — I110 Hypertensive heart disease with heart failure: Secondary | ICD-10-CM | POA: Diagnosis not present

## 2017-12-12 DIAGNOSIS — I5031 Acute diastolic (congestive) heart failure: Secondary | ICD-10-CM | POA: Diagnosis not present

## 2017-12-12 DIAGNOSIS — I130 Hypertensive heart and chronic kidney disease with heart failure and stage 1 through stage 4 chronic kidney disease, or unspecified chronic kidney disease: Secondary | ICD-10-CM | POA: Diagnosis not present

## 2017-12-12 DIAGNOSIS — I5032 Chronic diastolic (congestive) heart failure: Secondary | ICD-10-CM | POA: Diagnosis not present

## 2017-12-12 DIAGNOSIS — I4821 Permanent atrial fibrillation: Secondary | ICD-10-CM | POA: Diagnosis not present

## 2017-12-12 DIAGNOSIS — N189 Chronic kidney disease, unspecified: Secondary | ICD-10-CM | POA: Diagnosis not present

## 2017-12-12 DIAGNOSIS — D508 Other iron deficiency anemias: Secondary | ICD-10-CM | POA: Diagnosis not present

## 2017-12-12 DIAGNOSIS — K5730 Diverticulosis of large intestine without perforation or abscess without bleeding: Secondary | ICD-10-CM | POA: Diagnosis not present

## 2017-12-12 DIAGNOSIS — D122 Benign neoplasm of ascending colon: Secondary | ICD-10-CM | POA: Diagnosis not present

## 2017-12-12 DIAGNOSIS — I361 Nonrheumatic tricuspid (valve) insufficiency: Secondary | ICD-10-CM | POA: Diagnosis not present

## 2017-12-12 DIAGNOSIS — D500 Iron deficiency anemia secondary to blood loss (chronic): Secondary | ICD-10-CM | POA: Diagnosis not present

## 2017-12-12 DIAGNOSIS — I4891 Unspecified atrial fibrillation: Secondary | ICD-10-CM | POA: Diagnosis not present

## 2017-12-12 DIAGNOSIS — Z7901 Long term (current) use of anticoagulants: Secondary | ICD-10-CM | POA: Diagnosis not present

## 2017-12-12 DIAGNOSIS — I10 Essential (primary) hypertension: Secondary | ICD-10-CM | POA: Diagnosis not present

## 2017-12-12 DIAGNOSIS — I071 Rheumatic tricuspid insufficiency: Secondary | ICD-10-CM | POA: Diagnosis present

## 2017-12-12 DIAGNOSIS — N179 Acute kidney failure, unspecified: Secondary | ICD-10-CM | POA: Diagnosis not present

## 2017-12-12 DIAGNOSIS — M109 Gout, unspecified: Secondary | ICD-10-CM | POA: Diagnosis present

## 2017-12-12 DIAGNOSIS — K5731 Diverticulosis of large intestine without perforation or abscess with bleeding: Secondary | ICD-10-CM | POA: Diagnosis not present

## 2017-12-12 DIAGNOSIS — M170 Bilateral primary osteoarthritis of knee: Secondary | ICD-10-CM | POA: Diagnosis present

## 2017-12-12 DIAGNOSIS — E785 Hyperlipidemia, unspecified: Secondary | ICD-10-CM | POA: Diagnosis present

## 2017-12-12 DIAGNOSIS — E876 Hypokalemia: Secondary | ICD-10-CM | POA: Diagnosis not present

## 2017-12-12 DIAGNOSIS — Z23 Encounter for immunization: Secondary | ICD-10-CM | POA: Diagnosis not present

## 2017-12-12 DIAGNOSIS — K635 Polyp of colon: Secondary | ICD-10-CM | POA: Diagnosis present

## 2017-12-12 DIAGNOSIS — R0602 Shortness of breath: Secondary | ICD-10-CM | POA: Diagnosis present

## 2017-12-12 LAB — CBC
HCT: 20.6 % — ABNORMAL LOW (ref 36.0–46.0)
HEMOGLOBIN: 6 g/dL — AB (ref 12.0–15.0)
MCH: 24.5 pg — AB (ref 26.0–34.0)
MCHC: 29.1 g/dL — ABNORMAL LOW (ref 30.0–36.0)
MCV: 84.1 fL (ref 80.0–100.0)
NRBC: 0.7 % — AB (ref 0.0–0.2)
Platelets: 159 10*3/uL (ref 150–400)
RBC: 2.45 MIL/uL — AB (ref 3.87–5.11)
RDW: 17.4 % — ABNORMAL HIGH (ref 11.5–15.5)
WBC: 4.3 10*3/uL (ref 4.0–10.5)

## 2017-12-12 LAB — BASIC METABOLIC PANEL
ANION GAP: 7 (ref 5–15)
BUN: 24 mg/dL — ABNORMAL HIGH (ref 8–23)
CHLORIDE: 112 mmol/L — AB (ref 98–111)
CO2: 20 mmol/L — ABNORMAL LOW (ref 22–32)
Calcium: 8.2 mg/dL — ABNORMAL LOW (ref 8.9–10.3)
Creatinine, Ser: 1.78 mg/dL — ABNORMAL HIGH (ref 0.44–1.00)
GFR calc non Af Amer: 26 mL/min — ABNORMAL LOW (ref >60)
GFR, EST AFRICAN AMERICAN: 30 mL/min — AB (ref >60)
Glucose, Bld: 93 mg/dL (ref 70–99)
POTASSIUM: 3.3 mmol/L — AB (ref 3.5–5.1)
SODIUM: 139 mmol/L (ref 135–145)

## 2017-12-12 LAB — URINALYSIS, ROUTINE W REFLEX MICROSCOPIC
BILIRUBIN URINE: NEGATIVE
Glucose, UA: NEGATIVE mg/dL
Hgb urine dipstick: NEGATIVE
Ketones, ur: NEGATIVE mg/dL
Leukocytes, UA: NEGATIVE
NITRITE: NEGATIVE
PH: 6 (ref 5.0–8.0)
Protein, ur: NEGATIVE mg/dL
SPECIFIC GRAVITY, URINE: 1.008 (ref 1.005–1.030)

## 2017-12-12 LAB — PROTIME-INR
INR: 1.86
Prothrombin Time: 21.2 seconds — ABNORMAL HIGH (ref 11.4–15.2)

## 2017-12-12 LAB — PREPARE RBC (CROSSMATCH)

## 2017-12-12 MED ORDER — TRAZODONE HCL 50 MG PO TABS
50.0000 mg | ORAL_TABLET | Freq: Once | ORAL | Status: DC
  Filled 2017-12-12: qty 1

## 2017-12-12 MED ORDER — ACETAMINOPHEN 325 MG PO TABS
650.0000 mg | ORAL_TABLET | Freq: Once | ORAL | Status: AC
  Administered 2017-12-12: 650 mg via ORAL
  Filled 2017-12-12: qty 2

## 2017-12-12 MED ORDER — PANTOPRAZOLE SODIUM 40 MG PO TBEC
40.0000 mg | DELAYED_RELEASE_TABLET | Freq: Two times a day (BID) | ORAL | Status: DC
  Administered 2017-12-12 – 2017-12-19 (×14): 40 mg via ORAL
  Filled 2017-12-12 (×14): qty 1

## 2017-12-12 MED ORDER — SODIUM CHLORIDE 0.9% IV SOLUTION
Freq: Once | INTRAVENOUS | Status: AC
  Administered 2017-12-12: 11:00:00 via INTRAVENOUS

## 2017-12-12 MED ORDER — SODIUM CHLORIDE 0.9 % IV SOLN: INTRAVENOUS | Status: DC

## 2017-12-12 MED ORDER — PEG 3350-KCL-NA BICARB-NACL 420 G PO SOLR
4000.0000 mL | Freq: Once | ORAL | Status: AC
  Administered 2017-12-14: 4000 mL via ORAL

## 2017-12-12 MED ORDER — FUROSEMIDE 10 MG/ML IJ SOLN
20.0000 mg | Freq: Once | INTRAMUSCULAR | Status: DC
  Filled 2017-12-12: qty 2

## 2017-12-12 MED ORDER — POTASSIUM CHLORIDE CRYS ER 20 MEQ PO TBCR
40.0000 meq | EXTENDED_RELEASE_TABLET | Freq: Once | ORAL | Status: AC
  Administered 2017-12-12: 40 meq via ORAL
  Filled 2017-12-12: qty 2

## 2017-12-12 NOTE — Progress Notes (Signed)
[facility] PROGRESS NOTE  Jennifer Hinton YWV:[id number] DOB: 15-Dec-1935 DOA: 12/11/2017 PCP: [name], MD  Narrative:  36 African-American female HTN HLD chronic diastolic heart failure pulmonary hypertension Long-standing atrial fibrillation on Coumadin Severe LVH on last echo EF 60% Degenerative joint disease with pain in both knees Hypertension ?  Rheumatoid arthritis not on any DM ARDS  Admit 12/11/2017 with poor appetite generalized weakness and DOE Found to have hemoglobin 5.1 BUN/creatinine 25/1.8 baseline creatinine 1.5  A & Plan Unexplained anemia-borderline microcytic anemia hemoglobin still low in the 6 range but no emesis of blood or dark stool-transfused 2 units 10/24, transfuse another 2 units-GI aware of patient patient will need propofol for endoscopy patient will be seen by them-patient is stable at this time Patient has not had any falls and does not have any bruising to suggest hematoma anywhere  Permanent atrial fibrillation chads score >5-hold Coumadin at this time clarity regarding source of bleed-INR down  Mild exacerbation of underlying chronic diastolic heart failure-as per cardiology  Rheumatoid arthritis-not on any meds?-Monitor.  Given her age expect quiesced since of the same as she continues to age  Subacute weight loss-states over the past 3 weeks she has lost over 20 pounds-my concern is she may have some underlying other cause such as malignancy which may need to be worked up  Pulmonary hypertension-management as per cardiology with diuresis right now-unclear accuracy of diuresis  Hypokalemia-replace with supplementation today and recheck with potassium and mag morning  SCDs, full code, discussed with daughters at bedside, inpatient pending resolution   Jennifer Au, MD  [facility] Direct contact: [phone] --[place] [place]  --[email or web address]; password [email or web address]  7PM-7AM contact night coverage as above 12/12/2017, 2:31 PM  LOS:  0 days   Consultants:  Cardiology  GI  Procedures:  None  Antimicrobials:  no  Interval history/Subjective: I have lost a lot of weight" "I feels weak still but have not been vomiting" No chest pain No fever No chills, no dark stool, no tarry stool, no history of the same  Objective:  Vitals:  Vitals:   12/12/17 1325 12/12/17 1354  BP: 131/83 132/69  Pulse: 68 68  Resp: 16 (!) 22  Temp: 98.4 F (36.9 C) 99.2 F (37.3 C)  SpO2: 95% 94%    Exam:  Cachectic frail African-American female no icterus mild pallor Neck soft supple Chest is clear Soft nontender no rebound no guarding no organomegaly no lower extremity edema Neurologically intact Power 5/5  I have personally reviewed the following:   Labs: BUN/creatinine 24/1.7 down from admission labs 25/1.8 Iron 11, sat ratios 4 Hemoglobin up from 5.1-6.0  Imaging studies:  n  Medical tests:  n   Test discussed with performing physician:  n  Decision to obtain old records:  n  Review and summation of old records:  yes  Scheduled Meds: . amLODipine  10 mg Oral Daily  . carvedilol  25 mg Oral BID WC  . furosemide  20 mg Intravenous Once  . furosemide  40 mg Intravenous BID  . furosemide  20 mg Oral Daily  . hydrALAZINE  100 mg Oral TID  . Influenza vac split quadrivalent PF  0.5 mL Intramuscular Tomorrow-1000  . pantoprazole  40 mg Oral BID AC   Continuous Infusions:  Active Problems:   HTN (hypertension)   Anemia   Acute CHF (congestive heart failure) ([facility])   A-fib ([facility])   LOS: 0 days

## 2017-12-12 NOTE — Progress Notes (Addendum)
Progress Note  Patient Name: Jennifer Hinton Date of Encounter: 12/12/2017  Primary Cardiologist: Kirk Ruths, MD   Subjective   She is doing well. No SOB or orthopnea. No palpitations.  Inpatient Medications    Scheduled Meds: . sodium chloride   Intravenous Once  . acetaminophen  650 mg Oral Once  . amLODipine  10 mg Oral Daily  . carvedilol  25 mg Oral BID WC  . furosemide  20 mg Intravenous Once  . furosemide  40 mg Intravenous BID  . furosemide  20 mg Oral Daily  . hydrALAZINE  100 mg Oral TID  . Influenza vac split quadrivalent PF  0.5 mL Intramuscular Tomorrow-1000   Continuous Infusions:  PRN Meds:    Vital Signs    Vitals:   12/11/17 1715 12/11/17 1910 12/11/17 2010 12/12/17 0454  BP: 130/64 124/62 127/83 132/62  Pulse: 78 80 75 69  Resp: 18 18 17 17   Temp: 98.7 F (37.1 C) 99.1 F (37.3 C) 98.8 F (37.1 C) 97.9 F (36.6 C)  TempSrc: Oral Oral Oral Oral  SpO2: 100% 100% 100% 100%  Weight:      Height:        Intake/Output Summary (Last 24 hours) at 12/12/2017 0849 Last data filed at 12/12/2017 0455 Gross per 24 hour  Intake 1492.34 ml  Output 500 ml  Net 992.34 ml   Filed Weights   12/11/17 0932 12/11/17 1423  Weight: 64.9 kg 60.7 kg    Telemetry    N/A - Personally Reviewed  ECG    No new tracings - Personally Reviewed  Physical Exam   GEN: No acute distress.   Neck: No JVD Cardiac: irregular rhythm regular rte  Respiratory: Clear to auscultation bilaterally. GI: Soft, nontender, non-distended  MS: trace B LE edema; No deformity. Neuro:  Nonfocal  Psych: Normal affect   Labs    Chemistry Recent Labs  Lab 12/11/17 1034 12/12/17 0547  NA 141 139  K 3.2* 3.3*  CL 113* 112*  CO2 19* 20*  GLUCOSE 126* 93  BUN 25* 24*  CREATININE 1.84* 1.78*  CALCIUM 8.8* 8.2*  PROT 7.4  --   ALBUMIN 2.9*  --   AST 25  --   ALT 10  --   ALKPHOS 56  --   BILITOT 0.8  --   GFRNONAA 25* 26*  GFRAA 29* 30*  ANIONGAP 9 7      Hematology Recent Labs  Lab 12/11/17 1034 12/11/17 1234 12/12/17 0547  WBC 5.7  --  4.3  RBC 2.17* 2.08* 2.45*  HGB 5.1*  --  6.0*  HCT 17.9*  --  20.6*  MCV 82.5  --  84.1  MCH 23.5*  --  24.5*  MCHC 28.5*  --  29.1*  RDW 18.3*  --  17.4*  PLT 213  --  159    Cardiac EnzymesNo results for input(s): TROPONINI in the last 168 hours.  Recent Labs  Lab 12/11/17 1037  TROPIPOC 0.03     BNP Recent Labs  Lab 12/11/17 1034  BNP 851.6*     DDimer No results for input(s): DDIMER in the last 168 hours.   Radiology    Dg Chest 2 View  Result Date: 12/11/2017 CLINICAL DATA:  Fatigue, shortness of breath, loss of appetite. EXAM: CHEST - 2 VIEW COMPARISON:  06/04/2014 FINDINGS: Markedly enlarged cardiac silhouette. Calcific atherosclerotic disease of the aorta. Mediastinal contours appear intact. No evidence of pneumothorax. Mild interstitial pulmonary edema. Small bilateral  pleural effusions. Scattered peribronchial lower lobe atelectasis versus airspace consolidation. Osseous structures are without acute abnormality. Soft tissues are grossly normal. IMPRESSION: Small bilateral pleural effusions with mild interstitial pulmonary edema. Scattered peribronchial lower lobe atelectasis versus airspace consolidation. Markedly enlarged cardiac silhouette. This may be due to congestive heart failure, cardiomyopathy or pericardial effusion. Electronically Signed   By: Fidela Salisbury M.D.   On: 12/11/2017 11:16    Cardiac Studies   Echo 06/10/17: Study Conclusions - Procedure narrative: Transthoracic echocardiography for left   ventricular function evaluation and for evaluation of pulmonary   pressures. Image quality was adequate. The study was technically   difficult, as a result of poor acoustic windows, restricted   patient mobility, and body habitus. - Left ventricle: The cavity size was normal. Wall thickness was   increased in a pattern of moderate LVH. Systolic function  was   normal. The estimated ejection fraction was in the range of 60%   to 65%. Wall motion was normal; there were no regional wall   motion abnormalities. The study is not technically sufficient to   allow evaluation of LV diastolic function. - Aortic valve: Moderately calcified annulus. Trileaflet. There was   mild regurgitation. - Mitral valve: Calcified annulus. Mildly thickened leaflets .   There was moderate regurgitation. - Left atrium: The atrium was massively dilated. - Right atrium: The atrium was massively dilated. - Atrial septum: No defect or patent foramen ovale was identified. - Tricuspid valve: There was severe regurgitation. - Pulmonic valve: There was mild regurgitation. - Pulmonary arteries: PA peak pressure: 77 mm Hg (S). - Systemic veins: IVC is significantly dilated with normal   respiratory variation. Estimated CVP 8 mmHg.  Patient Profile     82 y.o. female with a hx of persistent atrial fibrillation on chronic anticoagulation with coumadin who is being seen for the evaluation of acute GIB.  Assessment & Plan    1. Acute GIB - holding coumadin - INR down to 1.86 today - Hb 6.0 (5.1) after 2 U PRBC yesterday - she is receiving 2 more units today  2. Permanent Afib - rate controlled - continue 25 mg coreg BID - holding AC  3. Severe TR, severre pulmonary hypertension, moderate MR 4. Acute on chronic diastolic heart failure - likely driven by profound anemia - BNP 850 with creatininei 1.8 - baseline creatinine 1.0-1.5 - K 3.3 - lasix 40 mg IV x 1 - scheduled for 20 mg IV lasix x 1 today - still net positive 1L, unclear if I&Os are accurate - weight is down to 134 lbs from 143 on admission - question accuracy - will give 20 mg IV lasix 2 hrs after second transfusion today  5. HTN - pressures are well-controlled overnight - continue norvasc 10 mg, coreg 25 mg BID, hydralazine 100 mg TID  For questions or updates, please contact [place] Please consult [email or web address] for contact info under     Signed, Ledora Bottcher, PA  12/12/2017, 8:49 AM    The patient was seen, examined and discussed with Minette Brine , PA-C and I agree with the above.   The patient continues to have stable vitals, transfused 2 units overnight, Hb 5.1->6.0, plan for 2 more units today. GI hasn't seen her yet, INR down to 1.86, Crea 1.84->1.78, baseline 1.5. She diuresed well, I would discontinue scheduled lasix and give only after transfusions 20 mg iv after each.  A-fib is rate controlled.   Ena Dawley, MD 12/12/2017

## 2017-12-12 NOTE — Consult Note (Signed)
Referring Provider: Dr. [name] Primary Care Physician:  [name], MD Primary Gastroenterologist:  Althia Forts  Reason for Consultation:  Anemia  HPI: Jennifer Hinton is a 82 y.o. female with multiple medical problems as stated below being seen for a consult due to anemia with Hgb 5.1. Heme positive. Has been very weak when sitting up at home. Has lost 10 pounds in the past month. Denies abdominal pain, melena, hematochezia. On Coumadin for Afib and INR 4.46 on admit yesterday and now 1.86. Denies N/V. Thinks she had a colonoscopy years ago but records not available at this time. Daughter in room.  Past Medical History:  Diagnosis Date  . Atrial fibrillation ([place])   . CHF (congestive heart failure) ([place])   . Dysrhythmia    A FIB   . Hyperlipidemia   . Hypertension   . RA (rheumatoid arthritis) ([place])     Past Surgical History:  Procedure Laterality Date  . NO PAST SURGERIES      Prior to Admission medications   Medication Sig Start Date End Date Taking? Authorizing Provider  amLODipine (NORVASC) 10 MG tablet TAKE 1 TABLET BY MOUTH EVERY DAY Patient taking differently: Take 10 mg by mouth daily.  04/10/17  Yes Lelon Perla, MD  carvedilol (COREG) 25 MG tablet TAKE 1 TABLET BY MOUTH TWICE A DAY WITH A MEAL, Patient taking differently: Take 25 mg by mouth 2 (two) times daily with a meal. TAKE 1 TABLET BY MOUTH TWICE A DAY WITH A MEAL, 05/27/17  Yes Almyra Deforest, PA  furosemide (LASIX) 20 MG tablet Take 1 tablet (20 mg total) by mouth daily. 05/27/17  Yes Almyra Deforest, PA  hydrALAZINE (APRESOLINE) 100 MG tablet Take 1 tablet (100 mg total) by mouth 3 (three) times daily. 05/27/17  Yes Almyra Deforest, PA  warfarin (COUMADIN) 5 MG tablet Take 1/2 to 1 tablet by mouth daily as directed by coumadin clinic 07/07/17  Yes Lelon Perla, MD  acetaminophen (TYLENOL) 650 MG CR tablet One tab by mouth q4-6h as needed pain in knee     [provider]    Scheduled Meds: . amLODipine  10 mg  Oral Daily  . carvedilol  25 mg Oral BID WC  . furosemide  20 mg Intravenous Once  . furosemide  40 mg Intravenous BID  . furosemide  20 mg Oral Daily  . hydrALAZINE  100 mg Oral TID  . Influenza vac split quadrivalent PF  0.5 mL Intramuscular Tomorrow-1000  . pantoprazole  40 mg Oral BID AC   Continuous Infusions: PRN Meds:.  Allergies as of 12/11/2017  . (No Known Allergies)    Family History  Problem Relation Age of Onset  . Heart disease Brother   . Hyperlipidemia Brother     Social History   Socioeconomic History  . Marital status: Widowed    Spouse name: Not on file  . Number of children: 6  . Years of education: 9th grade   . Highest education level: Not on file  Occupational History  . 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 Financial resource strain: Not on file  . Food insecurity:    Worry: Not on file    Inability: Not on file  . Transportation needs:    Medical: Not on file    Non-medical: Not on file  Tobacco Use  .  Smoking status: Never Smoker  . Smokeless tobacco: Never Used  Substance and Sexual Activity  . Alcohol use: No  . Drug use: No  . Sexual activity: Not on file  Lifestyle  . Physical activity:    Days per week: Not on file    Minutes per session: Not on file  . Stress: Not on file  Relationships  . Social connections:    Talks on phone: Not on file    Gets together: Not on file    Attends religious service: Not on file    Active member of club or organization: Not on file    Attends meetings of clubs or organizations: Not on file    Relationship status: Not on file  . Intimate partner violence:    Fear of current or ex partner: Not on file    Emotionally abused: Not on file    Physically abused: Not on file    Forced sexual activity: Not on file  Other Topics Concern  . Not on file  Social History Narrative   Had 6  children.   5 children living (oldest daughter passed away).    2 living in [place], 2 living in [place], 1 living in [place].       Raising your 19 yo great grandson Dorris Fetch, also a clinic patient).    Drives. Recently renewed license. Does not own a car so transportation is sometimes a limiting factor.              Review of Systems: All negative except as stated above in HPI.  Physical Exam: Vital signs: Vitals:   12/12/17 1325 12/12/17 1354  BP: 131/83 132/69  Pulse: 68 68  Resp: 16 (!) 22  Temp: 98.4 F (36.9 C) 99.2 F (37.3 C)  SpO2: 95% 94%   Last BM Date: 12/10/17 General:   Lethargic, thin, frail, pleasant and cooperative in NAD Head: normocephalic, atraumatic Eyes: anicteric sclera ENT: oropharynx clear Neck: supple, nontender Lungs:  Clear throughout to auscultation.   No wheezes, crackles, or rhonchi. No acute distress. Heart:  Regular rate and rhythm; no murmurs, clicks, rubs,  or gallops. Abdomen: soft, nontender, nondistended, +BS  Rectal:  Deferred Ext: no edema  GI:  Lab Results: Recent Labs    12/11/17 1034 12/12/17 0547  WBC 5.7 4.3  HGB 5.1* 6.0*  HCT 17.9* 20.6*  PLT 213 159   BMET Recent Labs    12/11/17 1034 12/12/17 0547  NA 141 139  K 3.2* 3.3*  CL 113* 112*  CO2 19* 20*  GLUCOSE 126* 93  BUN 25* 24*  CREATININE 1.84* 1.78*  CALCIUM 8.8* 8.2*   LFT Recent Labs    12/11/17 1034  PROT 7.4  ALBUMIN 2.9*  AST 25  ALT 10  ALKPHOS 56  BILITOT 0.8   PT/INR Recent Labs    12/11/17 1034 12/12/17 0547  LABPROT 41.7* 21.2*  INR 4.46* 1.86     Studies/Results: Dg Chest 2 View  Result Date: 12/11/2017 CLINICAL DATA:  Fatigue, shortness of breath, loss of appetite. EXAM: CHEST - 2 VIEW COMPARISON:  06/04/2014 FINDINGS: Markedly enlarged cardiac silhouette. Calcific atherosclerotic disease of the aorta. Mediastinal contours appear intact. No evidence of pneumothorax. Mild interstitial pulmonary edema. Small bilateral pleural  effusions. Scattered peribronchial lower lobe atelectasis versus airspace consolidation. Osseous structures are without acute abnormality. Soft tissues are grossly normal. IMPRESSION: Small bilateral pleural effusions with mild interstitial pulmonary edema. Scattered peribronchial lower lobe atelectasis versus airspace consolidation. Markedly enlarged  cardiac silhouette. This may be due to congestive heart failure, cardiomyopathy or pericardial effusion. Electronically Signed   By: Fidela Salisbury M.D.   On: 12/11/2017 11:16    Impression/Plan: Symptomatic anemia with heme positive stool in need of an EGD/colonoscopy as inpt. Will prep on 12/14/17 and do the procedures on Monday 12/15/17 with Propofol sedation. Start clear liquid diet for breakfast Sunday and start colon prep as well. Continue to hold anticoagulation. Supportive care.    LOS: 0 days   [name]  12/12/2017, 4:32 PM  Questions please call [phone]

## 2017-12-13 LAB — BASIC METABOLIC PANEL
ANION GAP: 9 (ref 5–15)
BUN: 25 mg/dL — ABNORMAL HIGH (ref 8–23)
CO2: 20 mmol/L — AB (ref 22–32)
Calcium: 8.6 mg/dL — ABNORMAL LOW (ref 8.9–10.3)
Chloride: 111 mmol/L (ref 98–111)
Creatinine, Ser: 1.92 mg/dL — ABNORMAL HIGH (ref 0.44–1.00)
GFR calc Af Amer: 27 mL/min — ABNORMAL LOW (ref >60)
GFR calc non Af Amer: 24 mL/min — ABNORMAL LOW (ref >60)
GLUCOSE: 98 mg/dL (ref 70–99)
POTASSIUM: 3.3 mmol/L — AB (ref 3.5–5.1)
Sodium: 140 mmol/L (ref 135–145)

## 2017-12-13 LAB — CBC WITH DIFFERENTIAL/PLATELET
ABS IMMATURE GRANULOCYTES: 0.03 10*3/uL (ref 0.00–0.07)
Basophils Absolute: 0 10*3/uL (ref 0.0–0.1)
Basophils Relative: 0 %
Eosinophils Absolute: 0 10*3/uL (ref 0.0–0.5)
Eosinophils Relative: 1 %
HEMATOCRIT: 26.6 % — AB (ref 36.0–46.0)
Hemoglobin: 8.1 g/dL — ABNORMAL LOW (ref 12.0–15.0)
IMMATURE GRANULOCYTES: 1 %
Lymphocytes Relative: 8 %
Lymphs Abs: 0.4 10*3/uL — ABNORMAL LOW (ref 0.7–4.0)
MCH: 25.6 pg — ABNORMAL LOW (ref 26.0–34.0)
MCHC: 30.5 g/dL (ref 30.0–36.0)
MCV: 84.2 fL (ref 80.0–100.0)
MONOS PCT: 6 %
Monocytes Absolute: 0.3 10*3/uL (ref 0.1–1.0)
NEUTROS ABS: 4.5 10*3/uL (ref 1.7–7.7)
NEUTROS PCT: 84 %
Platelets: 165 10*3/uL (ref 150–400)
RBC: 3.16 MIL/uL — ABNORMAL LOW (ref 3.87–5.11)
RDW: 17.9 % — ABNORMAL HIGH (ref 11.5–15.5)
WBC: 5.4 10*3/uL (ref 4.0–10.5)
nRBC: 0.6 % — ABNORMAL HIGH (ref 0.0–0.2)

## 2017-12-13 LAB — MAGNESIUM: Magnesium: 1.7 mg/dL (ref 1.7–2.4)

## 2017-12-13 MED ORDER — POTASSIUM CHLORIDE CRYS ER 20 MEQ PO TBCR
40.0000 meq | EXTENDED_RELEASE_TABLET | Freq: Two times a day (BID) | ORAL | Status: DC
  Administered 2017-12-13 – 2017-12-14 (×3): 40 meq via ORAL
  Filled 2017-12-13 (×3): qty 2

## 2017-12-13 MED ORDER — MAGNESIUM OXIDE 400 (241.3 MG) MG PO TABS
400.0000 mg | ORAL_TABLET | Freq: Two times a day (BID) | ORAL | Status: DC
  Administered 2017-12-13 – 2017-12-19 (×13): 400 mg via ORAL
  Filled 2017-12-13 (×13): qty 1

## 2017-12-13 NOTE — Progress Notes (Signed)
[facility] PROGRESS NOTE  Jennifer Hinton YIF:[id number] DOB: 18-Jun-1935 DOA: 12/11/2017 PCP: [name], MD  Narrative:  18 African-American female HTN HLD chronic diastolic heart failure pulmonary hypertension Long-standing atrial fibrillation on Coumadin Severe LVH on last echo EF 60% Degenerative joint disease with pain in both knees Hypertension ?  Rheumatoid arthritis not on any DM ARDS  Admit 12/11/2017 with poor appetite generalized weakness and DOE Found to have hemoglobin 5.1 BUN/creatinine 25/1.8 baseline creatinine 1.5  A & Plan Unexplained anemia microcytic anemia hemoglobin still low in the 6 range but no emesis of blood or dark stool-transfused 2 units 10/24, transfuse another 2 units-GI aware of patient patient will need propofol for endoscopy patient will be seen by them-patient is stable at this time--hemoglobin is improved  Permanent atrial fibrillation chads score >5-hold Coumadin at this time until further clarity regarding source of bleed-INR down Rpt labs am  Mild exacerbation of underlying chronic diastolic heart failure-as per cardiology--euvolemic to my exam  Rheumatoid arthritis-not on any meds?-Monitor.  Given her age expect quiescence of the same as she continues to age  Subacute weight loss-states over the past 3 weeks she has lost over 20 pounds-consider OP work-up  Pulmonary hypertension-management as per cardiology with diuresis right now-unclear accuracy of diuresis  Hypokalemia-replace with supplementation today and recheck with potassium and mag morning  SCDs, full code, discussed with daughters at bedside, inpatient pending resolution   [name], MD  [facility] Direct contact: [phone] --Via amion app OR  --[email or web address]; password [email or web address]  7PM-7AM contact night coverage as above 12/13/2017, 11:00 AM  LOS: 1 day   Consultants:  Cardiology  GI  Procedures:  None  Antimicrobials:  no  Interval  history/Subjective:  Awake pleasant eating some no dark stools no vomit blood Overall looks fair  Objective:  Vitals:  Vitals:   12/12/17 2048 12/13/17 0412  BP: 115/69 (!) 149/76  Pulse: 69 78  Resp: 20 20  Temp: 98.4 F (36.9 C) 98.4 F (36.9 C)  SpO2: 98% 97%    Exam:  No changes to exam  Cachectic frail African-American female no icterus mild pallor Neck soft supple Chest is clear Soft nontender no rebound no guarding no organomegaly no lower extremity edema Neurologically intact Power 5/5  I have personally reviewed the following:   Labs: Bun/cr 25/1.9 K3.3 Hb 8.1  Imaging studies:  n  Medical tests:  n   Test discussed with performing physician:  n  Decision to obtain old records:  n  Review and summation of old records:  yes  Scheduled Meds: . amLODipine  10 mg Oral Daily  . carvedilol  25 mg Oral BID WC  . furosemide  20 mg Intravenous Once  . furosemide  20 mg Oral Daily  . hydrALAZINE  100 mg Oral TID  . pantoprazole  40 mg Oral BID AC  . [START ON 12/14/2017] polyethylene glycol-electrolytes  4,000 mL Oral Once  . traZODone  50 mg Oral Once   Continuous Infusions: . sodium chloride      Active Problems:   HTN (hypertension)   Anemia   Acute CHF (congestive heart failure) ([facility])   A-fib ([facility])   LOS: 1 day

## 2017-12-13 NOTE — Progress Notes (Signed)
Progress Note  Patient Name: Jennifer Hinton Date of Encounter: 12/13/2017  Primary Cardiologist: Kirk Ruths, MD   Subjective   Patient denies Cp  Breathing is OK     Inpatient Medications    Scheduled Meds: . amLODipine  10 mg Oral Daily  . carvedilol  25 mg Oral BID WC  . furosemide  20 mg Intravenous Once  . furosemide  20 mg Oral Daily  . hydrALAZINE  100 mg Oral TID  . pantoprazole  40 mg Oral BID AC  . [START ON 12/14/2017] polyethylene glycol-electrolytes  4,000 mL Oral Once  . traZODone  50 mg Oral Once   Continuous Infusions: . sodium chloride     PRN Meds:    Vital Signs    Vitals:   12/12/17 1325 12/12/17 1354 12/12/17 2048 12/13/17 0412  BP: 131/83 132/69 115/69 (!) 149/76  Pulse: 68 68 69 78  Resp: 16 (!) 22 20 20   Temp: 98.4 F (36.9 C) 99.2 F (37.3 C) 98.4 F (36.9 C) 98.4 F (36.9 C)  TempSrc:  Oral Oral Oral  SpO2: 95% 94% 98% 97%  Weight:      Height:        Intake/Output Summary (Last 24 hours) at 12/13/2017 0942 Last data filed at 12/13/2017 0915 Gross per 24 hour  Intake 0 ml  Output 1650 ml  Net -1650 ml   Filed Weights   12/11/17 0932 12/11/17 1423  Weight: 64.9 kg 60.7 kg    Telemetry    Afib  60s  Occasional PVCs- Personally Reviewed  ECG    No new tracings - Personally Reviewed  Physical Exam   GEN: No acute distress.   Neck: No JVD Cardiac: irregular rhythm   Normal S1, S2  No murmurs   Respiratory: Clear to auscultation bilaterally. GI: Soft, nontender, non-distended  MS: trace B LE edema; No deformity. Neuro:  Nonfocal  Psych: Normal affect   Labs    Chemistry Recent Labs  Lab 12/11/17 1034 12/12/17 0547 12/13/17 0528  NA 141 139 140  K 3.2* 3.3* 3.3*  CL 113* 112* 111  CO2 19* 20* 20*  GLUCOSE 126* 93 98  BUN 25* 24* 25*  CREATININE 1.84* 1.78* 1.92*  CALCIUM 8.8* 8.2* 8.6*  PROT 7.4  --   --   ALBUMIN 2.9*  --   --   AST 25  --   --   ALT 10  --   --   ALKPHOS 56  --   --   BILITOT  0.8  --   --   GFRNONAA 25* 26* 24*  GFRAA 29* 30* 27*  ANIONGAP 9 7 9      Hematology Recent Labs  Lab 12/11/17 1034 12/11/17 1234 12/12/17 0547 12/13/17 0528  WBC 5.7  --  4.3 5.4  RBC 2.17* 2.08* 2.45* 3.16*  HGB 5.1*  --  6.0* 8.1*  HCT 17.9*  --  20.6* 26.6*  MCV 82.5  --  84.1 84.2  MCH 23.5*  --  24.5* 25.6*  MCHC 28.5*  --  29.1* 30.5  RDW 18.3*  --  17.4* 17.9*  PLT 213  --  159 165    Cardiac EnzymesNo results for input(s): TROPONINI in the last 168 hours.  Recent Labs  Lab 12/11/17 1037  TROPIPOC 0.03     BNP Recent Labs  Lab 12/11/17 1034  BNP 851.6*     DDimer No results for input(s): DDIMER in the last 168 hours.   Radiology  Dg Chest 2 View  Result Date: 12/11/2017 CLINICAL DATA:  Fatigue, shortness of breath, loss of appetite. EXAM: CHEST - 2 VIEW COMPARISON:  06/04/2014 FINDINGS: Markedly enlarged cardiac silhouette. Calcific atherosclerotic disease of the aorta. Mediastinal contours appear intact. No evidence of pneumothorax. Mild interstitial pulmonary edema. Small bilateral pleural effusions. Scattered peribronchial lower lobe atelectasis versus airspace consolidation. Osseous structures are without acute abnormality. Soft tissues are grossly normal. IMPRESSION: Small bilateral pleural effusions with mild interstitial pulmonary edema. Scattered peribronchial lower lobe atelectasis versus airspace consolidation. Markedly enlarged cardiac silhouette. This may be due to congestive heart failure, cardiomyopathy or pericardial effusion. Electronically Signed   By: Fidela Salisbury M.D.   On: 12/11/2017 11:16    Cardiac Studies   Echo 06/10/17: Study Conclusions - Procedure narrative: Transthoracic echocardiography for left   ventricular function evaluation and for evaluation of pulmonary   pressures. Image quality was adequate. The study was technically   difficult, as a result of poor acoustic windows, restricted   patient mobility, and body  habitus. - Left ventricle: The cavity size was normal. Wall thickness was   increased in a pattern of moderate LVH. Systolic function was   normal. The estimated ejection fraction was in the range of 60%   to 65%. Wall motion was normal; there were no regional wall   motion abnormalities. The study is not technically sufficient to   allow evaluation of LV diastolic function. - Aortic valve: Moderately calcified annulus. Trileaflet. There was   mild regurgitation. - Mitral valve: Calcified annulus. Mildly thickened leaflets .   There was moderate regurgitation. - Left atrium: The atrium was massively dilated. - Right atrium: The atrium was massively dilated. - Atrial septum: No defect or patent foramen ovale was identified. - Tricuspid valve: There was severe regurgitation. - Pulmonic valve: There was mild regurgitation. - Pulmonary arteries: PA peak pressure: 77 mm Hg (S). - Systemic veins: IVC is significantly dilated with normal   respiratory variation. Estimated CVP 8 mmHg.  Patient Profile     82 y.o. female with a hx of persistent atrial fibrillation on chronic anticoagulation with coumadin who is being seen for the evaluation of acute GIB.  Assessment & Plan   1  Permanent Afib - rate controlled - holding anticoagulation  2.Pulmonary HTN   Severe by echo in April   Follow Volume, BP   3. Acute on chronic diastolic heart failure Volume is not bad  4   Anemia    Hgb 8.1 today  Follow  Keep off of anticoag    5. HTN BP is OK    For questions or updates, please contact [place] Please consult [email or web address] for contact info under     Signed, Dorris Carnes, MD  12/13/2017, 9:42 AM    The patient was seen, examined and discussed with Minette Brine , PA-C and I agree with the above.   The patient continues to have stable vitals, transfused 2 units overnight, Hb 5.1->6.0, plan for 2 more units today. GI hasn't seen her yet, INR down to 1.86, Crea 1.84->1.78,  baseline 1.5. She diuresed well, I would discontinue scheduled lasix and give only after transfusions 20 mg iv after each.  A-fib is rate controlled.   Dorris Carnes, MD 12/13/2017

## 2017-12-14 LAB — CBC WITH DIFFERENTIAL/PLATELET
Abs Immature Granulocytes: 0.04 10*3/uL (ref 0.00–0.07)
Basophils Absolute: 0 10*3/uL (ref 0.0–0.1)
Basophils Relative: 0 %
EOS PCT: 1 %
Eosinophils Absolute: 0.1 10*3/uL (ref 0.0–0.5)
HEMATOCRIT: 27 % — AB (ref 36.0–46.0)
HEMOGLOBIN: 8.1 g/dL — AB (ref 12.0–15.0)
Immature Granulocytes: 1 %
LYMPHS ABS: 0.6 10*3/uL — AB (ref 0.7–4.0)
LYMPHS PCT: 10 %
MCH: 25.7 pg — AB (ref 26.0–34.0)
MCHC: 30 g/dL (ref 30.0–36.0)
MCV: 85.7 fL (ref 80.0–100.0)
MONO ABS: 0.4 10*3/uL (ref 0.1–1.0)
MONOS PCT: 8 %
Neutro Abs: 4.6 10*3/uL (ref 1.7–7.7)
Neutrophils Relative %: 80 %
Platelets: 167 10*3/uL (ref 150–400)
RBC: 3.15 MIL/uL — ABNORMAL LOW (ref 3.87–5.11)
RDW: 18.6 % — ABNORMAL HIGH (ref 11.5–15.5)
WBC: 5.7 10*3/uL (ref 4.0–10.5)
nRBC: 0.5 % — ABNORMAL HIGH (ref 0.0–0.2)

## 2017-12-14 LAB — RENAL FUNCTION PANEL
Albumin: 2.5 g/dL — ABNORMAL LOW (ref 3.5–5.0)
Anion gap: 8 (ref 5–15)
BUN: 28 mg/dL — ABNORMAL HIGH (ref 8–23)
CALCIUM: 8.5 mg/dL — AB (ref 8.9–10.3)
CO2: 20 mmol/L — ABNORMAL LOW (ref 22–32)
CREATININE: 2.14 mg/dL — AB (ref 0.44–1.00)
Chloride: 111 mmol/L (ref 98–111)
GFR calc Af Amer: 24 mL/min — ABNORMAL LOW (ref >60)
GFR calc non Af Amer: 21 mL/min — ABNORMAL LOW (ref >60)
GLUCOSE: 110 mg/dL — AB (ref 70–99)
Phosphorus: 2.6 mg/dL (ref 2.5–4.6)
Potassium: 4.3 mmol/L (ref 3.5–5.1)
SODIUM: 139 mmol/L (ref 135–145)

## 2017-12-14 LAB — MAGNESIUM: Magnesium: 1.8 mg/dL (ref 1.7–2.4)

## 2017-12-14 MED ORDER — POTASSIUM CHLORIDE CRYS ER 20 MEQ PO TBCR
40.0000 meq | EXTENDED_RELEASE_TABLET | Freq: Every day | ORAL | Status: DC
  Administered 2017-12-16 – 2017-12-19 (×4): 40 meq via ORAL
  Filled 2017-12-14 (×5): qty 2

## 2017-12-14 NOTE — Progress Notes (Signed)
[facility] Gastroenterology Progress Note  Jennifer Hinton 82 y.o. [birth year]   Subjective: Feels ok. No complaints. Resting in bed.  Objective: Vital signs: Vitals:   12/13/17 1930 12/14/17 0415  BP: 127/68 126/78  Pulse: 77 72  Resp: 20 20  Temp: 98.9 F (37.2 C) 98.4 F (36.9 C)  SpO2: 94% 96%    Physical Exam: Gen: elderly, frail, thin, alert, no acute distress  HEENT: anicteric sclera CV: RRR Chest: CTA B Abd: soft, nontender, nondistended, +BS Ext: no edema  Lab Results: Recent Labs    12/13/17 0528 12/14/17 0551  NA 140 139  K 3.3* 4.3  CL 111 111  CO2 20* 20*  GLUCOSE 98 110*  BUN 25* 28*  CREATININE 1.92* 2.14*  CALCIUM 8.6* 8.5*  MG 1.7 1.8  PHOS  --  2.6   Recent Labs    12/11/17 1034 12/14/17 0551  AST 25  --   ALT 10  --   ALKPHOS 56  --   BILITOT 0.8  --   PROT 7.4  --   ALBUMIN 2.9* 2.5*   Recent Labs    12/13/17 0528 12/14/17 0551  WBC 5.4 5.7  NEUTROABS 4.5 4.6  HGB 8.1* 8.1*  HCT 26.6* 27.0*  MCV 84.2 85.7  PLT 165 167      Assessment/Plan: Symptomatic anemia with plans for EGD/colonoscopy tomorrow. Colon prep today. Clear liquid diet. NPO p [place]. Dr. Penelope Hinton will do procedures tomorrow. Supportive care.   Jennifer Hinton 12/14/2017, 9:37 AM  Questions please call [phone] ID: Jennifer Hinton, female   DOB: [birth year], 82 y.o.   MRN: [id number]

## 2017-12-14 NOTE — Progress Notes (Signed)
Pt not tolerating golytely. Pt encouraged and educated about rationale for the prep for the procedure. Pt and grandson acknowledge their understanding of rationale. Will continue to follow up frequently. Pt knows she will be NPO @[place] . Charlott Holler, Barbee Shropshire, RN

## 2017-12-14 NOTE — Progress Notes (Signed)
Progress Note  Patient Name: Jennifer Hinton Date of Encounter: 12/14/2017  Primary Cardiologist: Kirk Ruths, MD   Subjective   No CP   Breathing OK   Pt resting   Inpatient Medications    Scheduled Meds: . amLODipine  10 mg Oral Daily  . carvedilol  25 mg Oral BID WC  . furosemide  20 mg Intravenous Once  . furosemide  20 mg Oral Daily  . hydrALAZINE  100 mg Oral TID  . magnesium oxide  400 mg Oral BID  . pantoprazole  40 mg Oral BID AC  . polyethylene glycol-electrolytes  4,000 mL Oral Once  . potassium chloride  40 mEq Oral BID  . traZODone  50 mg Oral Once   Continuous Infusions: . sodium chloride     PRN Meds:    Vital Signs    Vitals:   12/13/17 1348 12/13/17 1930 12/14/17 0415 12/14/17 0955  BP: 126/71 127/68 126/78 129/69  Pulse: 65 77 72 62  Resp: 20 20 20 16   Temp: 99.3 F (37.4 C) 98.9 F (37.2 C) 98.4 F (36.9 C) 98.2 F (36.8 C)  TempSrc: Oral Oral Oral Oral  SpO2: 98% 94% 96% 99%  Weight:      Height:        Intake/Output Summary (Last 24 hours) at 12/14/2017 1054 Last data filed at 12/14/2017 0900 Gross per 24 hour  Intake 360 ml  Output 925 ml  Net -565 ml   Filed Weights   12/11/17 0932 12/11/17 1423  Weight: 64.9 kg 60.7 kg    Telemetry    Afib  60s to 70s    Occasional PVCs- Personally Reviewed  ECG    No new tracings - Personally Reviewed  Physical Exam   GEN: Thin 82 yo in No acute distress.   Neck:  Difficult to assess with pt's position   Cardiac: irregular rhythm   Normal S1, S2  No murmurs   Respiratory: Clear to auscultation bilaterally. GI: Soft, nontender, non-distended  MS: trace B LE edema; No deformity. Neuro:  Nonfocal  Psych: Normal affect   Labs    Chemistry Recent Labs  Lab 12/11/17 1034 12/12/17 0547 12/13/17 0528 12/14/17 0551  NA 141 139 140 139  K 3.2* 3.3* 3.3* 4.3  CL 113* 112* 111 111  CO2 19* 20* 20* 20*  GLUCOSE 126* 93 98 110*  BUN 25* 24* 25* 28*  CREATININE 1.84* 1.78*  1.92* 2.14*  CALCIUM 8.8* 8.2* 8.6* 8.5*  PROT 7.4  --   --   --   ALBUMIN 2.9*  --   --  2.5*  AST 25  --   --   --   ALT 10  --   --   --   ALKPHOS 56  --   --   --   BILITOT 0.8  --   --   --   GFRNONAA 25* 26* 24* 21*  GFRAA 29* 30* 27* 24*  ANIONGAP 9 7 9 8      Hematology Recent Labs  Lab 12/12/17 0547 12/13/17 0528 12/14/17 0551  WBC 4.3 5.4 5.7  RBC 2.45* 3.16* 3.15*  HGB 6.0* 8.1* 8.1*  HCT 20.6* 26.6* 27.0*  MCV 84.1 84.2 85.7  MCH 24.5* 25.6* 25.7*  MCHC 29.1* 30.5 30.0  RDW 17.4* 17.9* 18.6*  PLT 159 165 167    Cardiac EnzymesNo results for input(s): TROPONINI in the last 168 hours.  Recent Labs  Lab 12/11/17 1037  TROPIPOC 0.03  BNP Recent Labs  Lab 12/11/17 1034  BNP 851.6*     DDimer No results for input(s): DDIMER in the last 168 hours.   Radiology    No results found.  Cardiac Studies   Echo 06/10/17: Study Conclusions - Procedure narrative: Transthoracic echocardiography for left   ventricular function evaluation and for evaluation of pulmonary   pressures. Image quality was adequate. The study was technically   difficult, as a result of poor acoustic windows, restricted   patient mobility, and body habitus. - Left ventricle: The cavity size was normal. Wall thickness was   increased in a pattern of moderate LVH. Systolic function was   normal. The estimated ejection fraction was in the range of 60%   to 65%. Wall motion was normal; there were no regional wall   motion abnormalities. The study is not technically sufficient to   allow evaluation of LV diastolic function. - Aortic valve: Moderately calcified annulus. Trileaflet. There was   mild regurgitation. - Mitral valve: Calcified annulus. Mildly thickened leaflets .   There was moderate regurgitation. - Left atrium: The atrium was massively dilated. - Right atrium: The atrium was massively dilated. - Atrial septum: No defect or patent foramen ovale was identified. -  Tricuspid valve: There was severe regurgitation. - Pulmonic valve: There was mild regurgitation. - Pulmonary arteries: PA peak pressure: 77 mm Hg (S). - Systemic veins: IVC is significantly dilated with normal   respiratory variation. Estimated CVP 8 mmHg.  Patient Profile     82 y.o. female with a hx of persistent atrial fibrillation on chronic anticoagulation with coumadin who is being seen for the evaluation of acute GIB.  Assessment & Plan   1  Permanent Afib - rate remain controlled - holding anticoagulation for now    2.Pulmonary HTN   Severe by echo in April  (see above) Follow Volume, BP   3. Acute on chronic diastolic heart failure Volume is not bad  4   Anemia    Hgb stable at 8.1 Follow  Pt sched for endoscopy tomorrow  Cont to keep off of anticoag    5. HTN BP is relatively controlled    6.  GI   As above   Endo in AM     For questions or updates, please contact [place] Please consult [email or web address] for contact info under     Signed, Dorris Carnes, MD  12/14/2017, 10:54 AM    The patient was seen, examined and discussed with Minette Brine , PA-C and I agree with the above.   The patient continues to have stable vitals, transfused 2 units overnight, Hb 5.1->6.0, plan for 2 more units today. GI hasn't seen her yet, INR down to 1.86, Crea 1.84->1.78, baseline 1.5. She diuresed well, I would discontinue scheduled lasix and give only after transfusions 20 mg iv after each.  A-fib is rate controlled.   Dorris Carnes, MD 12/14/2017

## 2017-12-14 NOTE — Progress Notes (Signed)
[facility] PROGRESS NOTE  Jennifer Hinton RDE:[id number] DOB: [birth year] DOA: 12/11/2017 PCP: [name], MD  Narrative:  20 African-American female HTN HLD chronic diastolic heart failure pulmonary hypertension Long-standing atrial fibrillation on Coumadin Severe LVH on last echo EF 60% Degenerative joint disease with pain in both knees Hypertension ?  Rheumatoid arthritis not on any DM ARDS  Admit 12/11/2017 with poor appetite generalized weakness and DOE Found to have hemoglobin 5.1 BUN/creatinine 25/1.8 baseline creatinine 1.5  A & Plan Unexplained anemia microcytic anemia hemoglobin-Hb stable 8-GI planning endo 10/28-patient is stable at this time  Permanent atrial fibrillation chads score >5-hold Coumadin at this time until further clarity regarding source of bleed-INR down INR in am  Mild exacerbation of underlying chronic diastolic heart failure-as per cardiology--euvolemic to my exam--holding Lasix today  Rheumatoid arthritis-not on any meds?-Monitor.  Given her age expect quiescence of the same as she continues to age  Subacute weight loss-states over the past 3 weeks she has lost over 20 pounds-consider OP work-up  Pulmonary hypertension-management as per cardiology with diuresis right now-unclear accuracy of diuresis  Hypokalemia-resolved s/p replacement-change to od from bid  SCDs, full code, discussed with daughters at bedside, inpatient pending resolution   [name], MD  [facility] Direct contact: [phone] --Via amion app OR  --[email or web address]; password [email or web address]  7PM-7AM contact night coverage as above 12/14/2017, 11:31 AM  LOS: 2 days   Consultants:  Cardiology  GI  Procedures:  None  Antimicrobials:  no  Interval history/Subjective:  Awake pleasant no distress no n/v/cp Eating some no fever no chlls  Objective:  Vitals:  Vitals:   12/14/17 0415 12/14/17 0955  BP: 126/78 129/69  Pulse: 72 62  Resp: 20 16  Temp: 98.4  F (36.9 C) 98.2 F (36.8 C)  SpO2: 96% 99%    Exam:  No changes to exam  Cachectic frail African-American female no icterus mild pallor Neck soft supple Chest is clear Soft nontender no rebound no guarding no organomegaly no lower extremity edema Neurologically intact Power 5/5  I have personally reviewed the following:   Labs: Bun/cr 25/1.9-->28/2.14 K3.3-->4.3 Hb 8.1-->8.2  Imaging studies:  n  Medical tests:  n   Test discussed with performing physician:  n  Decision to obtain old records:  n  Review and summation of old records:  yes  Scheduled Meds: . amLODipine  10 mg Oral Daily  . carvedilol  25 mg Oral BID WC  . furosemide  20 mg Intravenous Once  . furosemide  20 mg Oral Daily  . hydrALAZINE  100 mg Oral TID  . magnesium oxide  400 mg Oral BID  . pantoprazole  40 mg Oral BID AC  . polyethylene glycol-electrolytes  4,000 mL Oral Once  . potassium chloride  40 mEq Oral BID  . traZODone  50 mg Oral Once   Continuous Infusions: . sodium chloride      Active Problems:   HTN (hypertension)   Anemia   Acute CHF (congestive heart failure) (HCC)   A-fib (HCC)   LOS: 2 days

## 2017-12-15 LAB — CBC WITH DIFFERENTIAL/PLATELET
ABS IMMATURE GRANULOCYTES: 0.03 10*3/uL (ref 0.00–0.07)
BASOS PCT: 0 %
Basophils Absolute: 0 10*3/uL (ref 0.0–0.1)
Eosinophils Absolute: 0.1 10*3/uL (ref 0.0–0.5)
Eosinophils Relative: 1 %
HCT: 27.6 % — ABNORMAL LOW (ref 36.0–46.0)
HEMOGLOBIN: 8.2 g/dL — AB (ref 12.0–15.0)
IMMATURE GRANULOCYTES: 1 %
Lymphocytes Relative: 12 %
Lymphs Abs: 0.7 10*3/uL (ref 0.7–4.0)
MCH: 25.6 pg — ABNORMAL LOW (ref 26.0–34.0)
MCHC: 29.7 g/dL — ABNORMAL LOW (ref 30.0–36.0)
MCV: 86.3 fL (ref 80.0–100.0)
Monocytes Absolute: 0.3 10*3/uL (ref 0.1–1.0)
Monocytes Relative: 6 %
NEUTROS ABS: 4.3 10*3/uL (ref 1.7–7.7)
NEUTROS PCT: 80 %
PLATELETS: 183 10*3/uL (ref 150–400)
RBC: 3.2 MIL/uL — ABNORMAL LOW (ref 3.87–5.11)
RDW: 19.3 % — ABNORMAL HIGH (ref 11.5–15.5)
WBC: 5.3 10*3/uL (ref 4.0–10.5)
nRBC: 0 % (ref 0.0–0.2)

## 2017-12-15 LAB — TYPE AND SCREEN
ABO/RH(D): A POS
Antibody Screen: NEGATIVE
UNIT DIVISION: 0
UNIT DIVISION: 0
Unit division: 0
Unit division: 0

## 2017-12-15 LAB — BPAM RBC
BLOOD PRODUCT EXPIRATION DATE: 201911132359
BLOOD PRODUCT EXPIRATION DATE: 201911132359
BLOOD PRODUCT EXPIRATION DATE: 201911232359
Blood Product Expiration Date: 201911132359
ISSUE DATE / TIME: 201910241427
ISSUE DATE / TIME: 201910241654
ISSUE DATE / TIME: 201910251049
ISSUE DATE / TIME: 201910251300
UNIT TYPE AND RH: 6200
Unit Type and Rh: 6200
Unit Type and Rh: 6200
Unit Type and Rh: 6200

## 2017-12-15 LAB — RENAL FUNCTION PANEL
ALBUMIN: 2.4 g/dL — AB (ref 3.5–5.0)
ANION GAP: 7 (ref 5–15)
BUN: 23 mg/dL (ref 8–23)
CO2: 21 mmol/L — ABNORMAL LOW (ref 22–32)
Calcium: 8.6 mg/dL — ABNORMAL LOW (ref 8.9–10.3)
Chloride: 110 mmol/L (ref 98–111)
Creatinine, Ser: 1.97 mg/dL — ABNORMAL HIGH (ref 0.44–1.00)
GFR calc Af Amer: 27 mL/min — ABNORMAL LOW (ref >60)
GFR, EST NON AFRICAN AMERICAN: 23 mL/min — AB (ref >60)
GLUCOSE: 99 mg/dL (ref 70–99)
PHOSPHORUS: 2.5 mg/dL (ref 2.5–4.6)
POTASSIUM: 4.4 mmol/L (ref 3.5–5.1)
Sodium: 138 mmol/L (ref 135–145)

## 2017-12-15 LAB — PROTIME-INR
INR: 1.62
Prothrombin Time: 19 seconds — ABNORMAL HIGH (ref 11.4–15.2)

## 2017-12-15 MED ORDER — PEG 3350-KCL-NA BICARB-NACL 420 G PO SOLR
4000.0000 mL | Freq: Once | ORAL | Status: AC
  Administered 2017-12-15: 4000 mL via ORAL

## 2017-12-15 NOTE — Progress Notes (Signed)
[facility] PROGRESS NOTE  Jennifer Hinton MHD:[id number] DOB: [birth year] DOA: 12/11/2017 PCP: [name], MD  Narrative:  25 African-American female HTN HLD chronic diastolic heart failure pulmonary hypertension Long-standing atrial fibrillation on Coumadin Severe LVH on last echo EF 60% Degenerative joint disease with pain in both knees Hypertension ?  Rheumatoid arthritis not on any DM ARDS  Admit 12/11/2017 with poor appetite generalized weakness and DOE Found to have hemoglobin 5.1 BUN/creatinine 25/1.8 baseline creatinine 1.5  A & Plan Unexplained anemia microcytic anemia hemoglobin-Hb stable 8-GI planning endo 10/29 when can be prepped appropriately  Permanent atrial fibrillation chads score >5-hold Coumadin at this time until further clarity regarding source of bleed-INR down  Mild exacerbation of underlying chronic diastolic heart failure-as per cardiology--euvolemic to my exam--holding Lasix today  Rheumatoid arthritis-not on any meds?-Monitor.   Subacute weight loss-states over the past 3 weeks she has lost over 20 pounds-consider OP work-up  Pulmonary hypertension-keeping volumes even today-unclear accuracy of diuresis-cont amlodipine 10, coreg 25 bid, hydralazine 100 tid  Hypokalemia-resolved s/p replacement-change to od from bid  SCDs, full code, no family +, inpatient pending resolution   [name], MD  [facility] Direct contact: [phone] --Via amion app OR  --[email or web address]; password [email or web address]  7PM-7AM contact night coverage as above 12/15/2017, 11:57 AM  LOS: 3 days   Consultants:  Cardiology  GI  Procedures:  None  Antimicrobials:  no  Interval history/Subjective:  Couldn't tolerate all Prep nad currently  Drinking CLD  Objective:  Vitals:  Vitals:   12/15/17 0422 12/15/17 0918  BP: 125/70 133/75  Pulse: 74   Resp: 20   Temp: 98.1 F (36.7 C)   SpO2: 95%     Exam:  No changes to exam  Cachectic frail  African-American female no icterus mild pallor Neck soft supple Chest is clear Soft nontender no rebound no guarding no organomegaly no lower extremity edema Neurologically intact Power 5/5  I have personally reviewed the following:   Labs: Bun/cr 25/1.9-->28/2.14--23/1.9 K3.3-->4.3->4.4 Hb 8.1-->8.2-8.2  Imaging studies:  n  Medical tests:  n   Test discussed with performing physician:  n  Decision to obtain old records:  n  Review and summation of old records:  yes  Scheduled Meds: . amLODipine  10 mg Oral Daily  . carvedilol  25 mg Oral BID WC  . furosemide  20 mg Intravenous Once  . hydrALAZINE  100 mg Oral TID  . magnesium oxide  400 mg Oral BID  . pantoprazole  40 mg Oral BID AC  . polyethylene glycol-electrolytes  4,000 mL Oral Once  . potassium chloride  40 mEq Oral Daily  . traZODone  50 mg Oral Once   Continuous Infusions: . sodium chloride      Active Problems:   HTN (hypertension)   Anemia   Acute CHF (congestive heart failure) (HCC)   A-fib (HCC)   LOS: 3 days

## 2017-12-15 NOTE — Care Management Important Message (Signed)
Important Message  Patient Details  Name: Jennifer Hinton MRN: [id number] Date of Birth: 03-03-1935   Medicare Important Message Given:  Yes    Kerin Salen 12/15/2017, 11:06 AMImportant Message  Patient Details  Name: Jennifer Hinton MRN: [id number] Date of Birth: 14-Feb-1936   Medicare Important Message Given:  Yes    Kerin Salen 12/15/2017, 11:06 AM

## 2017-12-15 NOTE — Progress Notes (Signed)
Pt did not complete golytely prep. 1 loose BM.  Kizzie Ide, RN

## 2017-12-15 NOTE — Progress Notes (Signed)
Spoke with Endo and since patient had not completed enough of the bowel prep will push back coloscopy for today. Dr. Penelope Coop stated patient could be on clear liquid diet.

## 2017-12-15 NOTE — Progress Notes (Signed)
Progress Note  Patient Name: Jennifer Hinton Date of Encounter: 12/15/2017  Primary Cardiologist: Kirk Ruths, MD   Subjective   Ms. Freundlich is bright and cheerful today. She denies any chest pain, shortness of breath, palpitations or lightheadedness.   Inpatient Medications    Scheduled Meds: . amLODipine  10 mg Oral Daily  . carvedilol  25 mg Oral BID WC  . furosemide  20 mg Intravenous Once  . hydrALAZINE  100 mg Oral TID  . magnesium oxide  400 mg Oral BID  . pantoprazole  40 mg Oral BID AC  . polyethylene glycol-electrolytes  4,000 mL Oral Once  . potassium chloride  40 mEq Oral Daily  . traZODone  50 mg Oral Once   Continuous Infusions: . sodium chloride     PRN Meds:    Vital Signs    Vitals:   12/14/17 1348 12/14/17 1949 12/15/17 0422 12/15/17 0918  BP: 135/68 135/69 125/70 133/75  Pulse: 67 65 74   Resp: 14 (!) 24 20   Temp: 98.1 F (36.7 C) 97.8 F (36.6 C) 98.1 F (36.7 C)   TempSrc: Oral Oral Oral   SpO2: 98% 99% 95%   Weight:      Height:       No intake or output data in the 24 hours ending 12/15/17 1133 Filed Weights   12/11/17 0932 12/11/17 1423  Weight: 64.9 kg 60.7 kg    Telemetry    afib 60's-70's - Personally Reviewed  ECG    No new tracings- Personally Reviewed  Physical Exam   GEN: No acute distress.   Neck: No JVD Cardiac: Irregularly irregular rhythm, no murmurs, rubs, or gallops.  Respiratory: Clear to auscultation bilaterally. GI: Soft, nontender, non-distended  MS: No edema; No deformity. Neuro:  Nonfocal  Psych: Normal affect   Labs    Chemistry Recent Labs  Lab 12/11/17 1034  12/13/17 0528 12/14/17 0551 12/15/17 0619  NA 141   < > 140 139 138  K 3.2*   < > 3.3* 4.3 4.4  CL 113*   < > 111 111 110  CO2 19*   < > 20* 20* 21*  GLUCOSE 126*   < > 98 110* 99  BUN 25*   < > 25* 28* 23  CREATININE 1.84*   < > 1.92* 2.14* 1.97*  CALCIUM 8.8*   < > 8.6* 8.5* 8.6*  PROT 7.4  --   --   --   --   ALBUMIN 2.9*   --   --  2.5* 2.4*  AST 25  --   --   --   --   ALT 10  --   --   --   --   ALKPHOS 56  --   --   --   --   BILITOT 0.8  --   --   --   --   GFRNONAA 25*   < > 24* 21* 23*  GFRAA 29*   < > 27* 24* 27*  ANIONGAP 9   < > 9 8 7    < > = values in this interval not displayed.     Hematology Recent Labs  Lab 12/13/17 0528 12/14/17 0551 12/15/17 0619  WBC 5.4 5.7 5.3  RBC 3.16* 3.15* 3.20*  HGB 8.1* 8.1* 8.2*  HCT 26.6* 27.0* 27.6*  MCV 84.2 85.7 86.3  MCH 25.6* 25.7* 25.6*  MCHC 30.5 30.0 29.7*  RDW 17.9* 18.6* 19.3*  PLT 165 167 183  Cardiac EnzymesNo results for input(s): TROPONINI in the last 168 hours.  Recent Labs  Lab 12/11/17 1037  TROPIPOC 0.03     BNP Recent Labs  Lab 12/11/17 1034  BNP 851.6*     DDimer No results for input(s): DDIMER in the last 168 hours.   Radiology    No results found.  Cardiac Studies   Echo 06/10/17: Study Conclusions - Procedure narrative: Transthoracic echocardiography for left ventricular function evaluation and for evaluation of pulmonary pressures. Image quality was adequate. The study was technically difficult, as a result of poor acoustic windows, restricted patient mobility, and body habitus. - Left ventricle: The cavity size was normal. Wall thickness was increased in a pattern of moderate LVH. Systolic function was normal. The estimated ejection fraction was in the range of 60% to 65%. Wall motion was normal; there were no regional wall motion abnormalities. The study is not technically sufficient to allow evaluation of LV diastolic function. - Aortic valve: Moderately calcified annulus. Trileaflet. There was mild regurgitation. - Mitral valve: Calcified annulus. Mildly thickened leaflets . There was moderate regurgitation. - Left atrium: The atrium was massively dilated. - Right atrium: The atrium was massively dilated. - Atrial septum: No defect or patent foramen ovale was  identified. - Tricuspid valve: There was severe regurgitation. - Pulmonic valve: There was mild regurgitation. - Pulmonary arteries: PA peak pressure: 77 mm Hg (S). - Systemic veins: IVC is significantly dilated with normal respiratory variation. Estimated CVP 8 mmHg.  Patient Profile     82 y.o. female with a hx of persistent atrial fibrillation on chronic anticoagulation with coumadinwho is being seen for the evaluation of acute GIB.  Assessment & Plan    1.  Permanent atrial fibrillation -Rate controlled on beta-blocker -Pt feels very well  2.  Hypertension -On amlodipine 10 mg daily, carvedilol 25 mg twice daily and hydralazine 100 mg 3 times daily -Blood Pressures currently well controlled  3.  Pulmonary hypertension -Blood pressure management  4.  Acute on chronic diastolic heart failure -Volume is stable  5.  Anemia -Seen by GI, patient planned for EGD/colonoscopy today but her prep is not good enough so will try tomorrow.       For questions or updates, please contact [place] Please consult [email or web address] for contact info under        Signed, Daune Perch, NP  12/15/2017, 11:33 AM

## 2017-12-15 NOTE — Anesthesia Preprocedure Evaluation (Addendum)
Anesthesia Evaluation  Patient identified by MRN, date of birth, ID band Patient awake    Reviewed: Allergy & Precautions, NPO status , Patient's Chart, lab work & pertinent test results, reviewed documented beta blocker date and time   Airway Mallampati: II  TM Distance: >3 FB Neck ROM: Full    Dental no notable dental hx.    Pulmonary neg pulmonary ROS,    Pulmonary exam normal breath sounds clear to auscultation       Cardiovascular hypertension, Pt. on medications and Pt. on home beta blockers + dysrhythmias Atrial Fibrillation  Rhythm:Irregular Rate:Normal     Neuro/Psych negative neurological ROS  negative psych ROS   GI/Hepatic negative GI ROS, Neg liver ROS,   Endo/Other  negative endocrine ROS  Renal/GU Renal InsufficiencyRenal disease  negative genitourinary   Musculoskeletal  (+) Arthritis , Rheumatoid disorders,    Abdominal   Peds negative pediatric ROS (+)  Hematology  (+) anemia ,   Anesthesia Other Findings   Reproductive/Obstetrics negative OB ROS                            Anesthesia Physical Anesthesia Plan  ASA: III  Anesthesia Plan: MAC   Post-op Pain Management:    Induction: Intravenous  PONV Risk Score and Plan: 0  Airway Management Planned: Simple Face Mask  Additional Equipment:   Intra-op Plan:   Post-operative Plan:   Informed Consent: I have reviewed the patients History and Physical, chart, labs and discussed the procedure including the risks, benefits and alternatives for the proposed anesthesia with the patient or authorized representative who has indicated his/her understanding and acceptance.   Dental advisory given  Plan Discussed with: CRNA and Surgeon  Anesthesia Plan Comments:         Anesthesia Quick Evaluation

## 2017-12-16 ENCOUNTER — Inpatient Hospital Stay (HOSPITAL_COMMUNITY): Payer: Medicare Other | Admitting: Registered Nurse

## 2017-12-16 ENCOUNTER — Encounter (HOSPITAL_COMMUNITY): Payer: Self-pay | Admitting: *Deleted

## 2017-12-16 ENCOUNTER — Encounter (HOSPITAL_COMMUNITY)
Admission: EM | Disposition: A | Discharge status: Home / Self Care | Payer: Self-pay | Source: Home / Self Care | Attending: Family Medicine

## 2017-12-16 HISTORY — PX: ESOPHAGOGASTRODUODENOSCOPY (EGD) WITH PROPOFOL: SHX5813

## 2017-12-16 HISTORY — PX: POLYPECTOMY: SHX5525

## 2017-12-16 HISTORY — PX: COLONOSCOPY WITH PROPOFOL: SHX5780

## 2017-12-16 LAB — BASIC METABOLIC PANEL
Anion gap: 5 (ref 5–15)
BUN: 21 mg/dL (ref 8–23)
CHLORIDE: 110 mmol/L (ref 98–111)
CO2: 22 mmol/L (ref 22–32)
Calcium: 8.4 mg/dL — ABNORMAL LOW (ref 8.9–10.3)
Creatinine, Ser: 1.84 mg/dL — ABNORMAL HIGH (ref 0.44–1.00)
GFR calc Af Amer: 29 mL/min — ABNORMAL LOW (ref >60)
GFR, EST NON AFRICAN AMERICAN: 25 mL/min — AB (ref >60)
GLUCOSE: 96 mg/dL (ref 70–99)
POTASSIUM: 4.1 mmol/L (ref 3.5–5.1)
Sodium: 137 mmol/L (ref 135–145)

## 2017-12-16 LAB — MAGNESIUM: Magnesium: 2 mg/dL (ref 1.7–2.4)

## 2017-12-16 SURGERY — ESOPHAGOGASTRODUODENOSCOPY (EGD) WITH PROPOFOL
Anesthesia: Monitor Anesthesia Care

## 2017-12-16 MED ORDER — PROPOFOL 10 MG/ML IV BOLUS
INTRAVENOUS | Status: AC
  Filled 2017-12-16: qty 40

## 2017-12-16 MED ORDER — PROPOFOL 500 MG/50ML IV EMUL
INTRAVENOUS | Status: DC | PRN
  Administered 2017-12-16: 35 mg via INTRAVENOUS

## 2017-12-16 MED ORDER — LACTATED RINGERS IV SOLN
INTRAVENOUS | Status: DC | PRN
  Administered 2017-12-16: 10:00:00 via INTRAVENOUS

## 2017-12-16 MED ORDER — PROPOFOL 500 MG/50ML IV EMUL
INTRAVENOUS | Status: DC | PRN
  Administered 2017-12-16: 125 ug/kg/min via INTRAVENOUS

## 2017-12-16 MED ORDER — ONDANSETRON HCL 4 MG/2ML IJ SOLN
INTRAMUSCULAR | Status: DC | PRN
  Administered 2017-12-16: 4 mg via INTRAVENOUS

## 2017-12-16 SURGICAL SUPPLY — 25 items

## 2017-12-16 NOTE — Progress Notes (Signed)
[facility] PROGRESS NOTE  Jennifer Hinton YTK:[id number] DOB: [birth year] DOA: 12/11/2017 PCP: [name], MD  Narrative:  13 African-American female HTN HLD chronic diastolic heart failure pulmonary hypertension Long-standing atrial fibrillation on Coumadin Severe LVH on last echo EF 60% Degenerative joint disease with pain in both knees Hypertension ?  Rheumatoid arthritis not on any DM ARDS  Admit 12/11/2017 with poor appetite generalized weakness and DOE Found to have hemoglobin 5.1 BUN/creatinine 25/1.8 baseline creatinine 1.5  A & Plan Unexplained anemia microcytic anemia hemoglobin-Hb stable 8-[place] 10/29 as below  Permanent atrial fibrillation chads score >5-hold Coumadin at this time until further clarity regarding source of bleed-INR down--resume coumadin 12/23/17  Mild exacerbation of underlying chronic diastolic heart failure-as per cardiology--euvolemic to my exam--holding Lasix today  Rheumatoid arthritis-not on any meds?-Monitor.   Subacute weight loss-states over the past 3 weeks she has lost over 20 pounds-consider OP work-up  Pulmonary hypertension-keeping volumes even today-unclear accuracy of diuresis-cont amlodipine 10, coreg 25 bid, hydralazine 100 tid  Hypokalemia-resolved s/p replacement-change to od from bid  SCDs, full code, no family +, inpatient pending resolution   [name], MD  [facility] Direct contact: [phone] --Via amion app OR  --[email or web address]; password [email or web address]  7PM-7AM contact night coverage as above 12/16/2017, 12:54 PM  LOS: 4 days   Consultants:  Cardiology  GI  Procedures: Impression:               - One 15 mm polyp in the proximal ascending colon,                            removed with a hot snare. Resected and retrieved.                           - Diverticulosis in the sigmoid colon and in the                            descending colon. Moderate Sedation:      . Recommendation:           - Resume  regular diet.                           - Continue present medications.                           - Resume Coumadin (warfarin) at prior dose in 7                            days. If possible.                           - Await pathology results.                           - Repeat colonoscopy is not recommended for                            screening purposes.  Antimicrobials:  no  Interval history/Subjective:  Fair abotu to go for scope  No distress  Objective:  Vitals:  Vitals:   12/16/17 1145 12/16/17 1150  BP:  137/73  Pulse: 70 70  Resp: (!) 22 19  Temp:    SpO2: 97% 96%    Exam:  No changes to exam  Cachectic frail African-American female no icterus mild pallor Neck soft supple Chest is clear Soft nontender no rebound no guarding no organomegaly no lower extremity edema Neurologically intact Power 5/5  I have personally reviewed the following:   Labs: Bun/cr 25/1.9-->21/1.8 K3.3-->4.3->4.1 Hb 8.1-->8.2-8.2  Imaging studies:  n  Medical tests:  n   Test discussed with performing physician:  n  Decision to obtain old records:  n  Review and summation of old records:  yes  Scheduled Meds: . amLODipine  10 mg Oral Daily  . carvedilol  25 mg Oral BID WC  . hydrALAZINE  100 mg Oral TID  . magnesium oxide  400 mg Oral BID  . pantoprazole  40 mg Oral BID AC  . potassium chloride  40 mEq Oral Daily  . traZODone  50 mg Oral Once   Continuous Infusions: . sodium chloride      Active Problems:   HTN (hypertension)   Anemia   Acute CHF (congestive heart failure) (HCC)   A-fib (HCC)   LOS: 4 days

## 2017-12-16 NOTE — Progress Notes (Signed)
Progress Note  Patient Name: Jennifer Hinton Date of Encounter: 12/16/2017  Primary Cardiologist: Kirk Ruths, MD   Subjective   Pt resting comfortably on room air. Denies chest pain, SOB, and palpitations.   Inpatient Medications    Scheduled Meds: . amLODipine  10 mg Oral Daily  . carvedilol  25 mg Oral BID WC  . hydrALAZINE  100 mg Oral TID  . magnesium oxide  400 mg Oral BID  . pantoprazole  40 mg Oral BID AC  . potassium chloride  40 mEq Oral Daily  . traZODone  50 mg Oral Once   Continuous Infusions: . sodium chloride     PRN Meds:    Vital Signs    Vitals:   12/15/17 1428 12/15/17 1455 12/15/17 1945 12/16/17 0410  BP: 126/65  139/66 (!) 140/56  Pulse: (!) 54 62 68 76  Resp: 17  20 20   Temp:   98 F (36.7 C) 98.1 F (36.7 C)  TempSrc:   Oral Oral  SpO2: 98%  98% 94%  Weight:      Height:        Intake/Output Summary (Last 24 hours) at 12/16/2017 0859 Last data filed at 12/15/2017 1756 Gross per 24 hour  Intake 1320 ml  Output -  Net 1320 ml   Filed Weights   12/11/17 0932 12/11/17 1423  Weight: 64.9 kg 60.7 kg    Telemetry    Afib - Personally Reviewed  ECG    No new tracings - Personally Reviewed  Physical Exam   GEN: No acute distress.   Neck: No JVD Cardiac: irregular rhythm, regular rate, no murmur  Respiratory: Clear to auscultation bilaterally. GI: Soft, nontender, non-distended  MS: No edema; No deformity. Neuro:  Nonfocal  Psych: Normal affect   Labs    Chemistry Recent Labs  Lab 12/11/17 1034  12/14/17 0551 12/15/17 0619 12/16/17 0644  NA 141   < > 139 138 137  K 3.2*   < > 4.3 4.4 4.1  CL 113*   < > 111 110 110  CO2 19*   < > 20* 21* 22  GLUCOSE 126*   < > 110* 99 96  BUN 25*   < > 28* 23 21  CREATININE 1.84*   < > 2.14* 1.97* 1.84*  CALCIUM 8.8*   < > 8.5* 8.6* 8.4*  PROT 7.4  --   --   --   --   ALBUMIN 2.9*  --  2.5* 2.4*  --   AST 25  --   --   --   --   ALT 10  --   --   --   --   ALKPHOS 56  --    --   --   --   BILITOT 0.8  --   --   --   --   GFRNONAA 25*   < > 21* 23* 25*  GFRAA 29*   < > 24* 27* 29*  ANIONGAP 9   < > 8 7 5    < > = values in this interval not displayed.     Hematology Recent Labs  Lab 12/13/17 0528 12/14/17 0551 12/15/17 0619  WBC 5.4 5.7 5.3  RBC 3.16* 3.15* 3.20*  HGB 8.1* 8.1* 8.2*  HCT 26.6* 27.0* 27.6*  MCV 84.2 85.7 86.3  MCH 25.6* 25.7* 25.6*  MCHC 30.5 30.0 29.7*  RDW 17.9* 18.6* 19.3*  PLT 165 167 183    Cardiac EnzymesNo results for  input(s): TROPONINI in the last 168 hours.  Recent Labs  Lab 12/11/17 1037  TROPIPOC 0.03     BNP Recent Labs  Lab 12/11/17 1034  BNP 851.6*     DDimer No results for input(s): DDIMER in the last 168 hours.   Radiology    No results found.  Cardiac Studies   Echo 06/10/17: Study Conclusions - Procedure narrative: Transthoracic echocardiography for left   ventricular function evaluation and for evaluation of pulmonary   pressures. Image quality was adequate. The study was technically   difficult, as a result of poor acoustic windows, restricted   patient mobility, and body habitus. - Left ventricle: The cavity size was normal. Wall thickness was   increased in a pattern of moderate LVH. Systolic function was   normal. The estimated ejection fraction was in the range of 60%   to 65%. Wall motion was normal; there were no regional wall   motion abnormalities. The study is not technically sufficient to   allow evaluation of LV diastolic function. - Aortic valve: Moderately calcified annulus. Trileaflet. There was   mild regurgitation. - Mitral valve: Calcified annulus. Mildly thickened leaflets .   There was moderate regurgitation. - Left atrium: The atrium was massively dilated. - Right atrium: The atrium was massively dilated. - Atrial septum: No defect or patent foramen ovale was identified. - Tricuspid valve: There was severe regurgitation. - Pulmonic valve: There was mild  regurgitation. - Pulmonary arteries: PA peak pressure: 77 mm Hg (S). - Systemic veins: IVC is significantly dilated with normal   respiratory variation. Estimated CVP 8 mmHg.  Patient Profile     82 y.o. female with a hx of persistent atrial fibrillation on chronic anticoagulation with coumadinwho is being seen for the evaluation of acute GIB.  Assessment & Plan    1. Permanent atrial fibrillation - rate controlled on beta blocker - AC on hold for GI bleed   2. HTN - norvasc, coreg, and hydralazine - pressures well-controlled   3. Pulmonary hypertension 4. Acute on chronic diastolic heart failure - euvolemic on exam   5. Anemia - GI following, scope today     For questions or updates, please contact [place] Please consult [email or web address] for contact info under        Signed, Ledora Bottcher, PA  12/16/2017, 8:59 AM

## 2017-12-16 NOTE — Transfer of Care (Signed)
Immediate Anesthesia Transfer of Care Note  Patient: Neela Zecca  Procedure(s) Performed: ESOPHAGOGASTRODUODENOSCOPY (EGD) WITH PROPOFOL (N/A ) COLONOSCOPY WITH PROPOFOL (N/A ) POLYPECTOMY HOT HEMOSTASIS (ARGON PLASMA COAGULATION/BICAP) (N/A )  Patient Location: PACU  Anesthesia Type:MAC  Level of Consciousness: drowsy, patient cooperative and responds to stimulation  Airway & Oxygen Therapy: Patient Spontanous Breathing and Patient connected to nasal cannula oxygen  Post-op Assessment: Report given to RN and Post -op Vital signs reviewed and stable  Post vital signs: Reviewed and stable  Last Vitals:  Vitals Value Taken Time  BP    Temp    Pulse 159 12/16/2017 11:30 AM  Resp    SpO2 89 % 12/16/2017 11:30 AM  Vitals shown include unvalidated device data.  Last Pain:  Vitals:   12/16/17 0933  TempSrc: Oral  PainSc: 0-No pain         Complications: No apparent anesthesia complications

## 2017-12-16 NOTE — Op Note (Signed)
[facility] Patient Name: Jennifer Hinton Procedure Date: 12/16/2017 MRN: [id number] Attending MD: Wonda Horner , MD Date of Birth: Aug 31, 1935 CSN: [id number] Age: 82 Admit Type: Inpatient Procedure:                Upper GI endoscopy Indications:              Iron deficiency anemia secondary to chronic blood                            loss, Heme positive stool Providers:                Wonda Horner, MD, Vista Lawman, RN, Elspeth Cho Tech., Technician, Rosario Adie, CRNA Referring MD:              Medicines:                Propofol per Anesthesia Complications:            No immediate complications. Estimated Blood Loss:     Estimated blood loss: none. Procedure:                Pre-Anesthesia Assessment:                           - Prior to the procedure, a History and Physical                            was performed, and patient medications and                            allergies were reviewed. The patient's tolerance of                            previous anesthesia was also reviewed. The risks                            and benefits of the procedure and the sedation                            options and risks were discussed with the patient.                            All questions were answered, and informed consent                            was obtained. Prior Anticoagulants: The patient has                            taken Coumadin (warfarin), last dose was 6 days                            prior to procedure. ASA Grade Assessment: III - A  patient with severe systemic disease. After                            reviewing the risks and benefits, the patient was                            deemed in satisfactory condition to undergo the                            procedure.                           After obtaining informed consent, the endoscope was                            passed under direct  vision. Throughout the                            procedure, the patient's blood pressure, pulse, and                            oxygen saturations were monitored continuously. The                            [id number]-H190 ([id number]) Olympus adult endoscope was                            introduced through the mouth, and advanced to the                            second part of duodenum. The upper GI endoscopy was                            accomplished without difficulty. The patient                            tolerated the procedure well. Scope In: Scope Out: Findings:      The examined esophagus was normal.      The entire examined stomach was normal.      The examined duodenum was normal. Impression:               - Normal esophagus.                           - Normal stomach.                           - Normal examined duodenum.                           - No specimens collected. Moderate Sedation:      . Recommendation:           - Resume regular diet.                           - Continue present medications.  Procedure Code(s):        --- Professional ---                           [id number], Esophagogastroduodenoscopy, flexible,                            transoral; diagnostic, including collection of                            specimen(s) by brushing or washing, when performed                            (separate procedure) Diagnosis Code(s):        --- Professional ---                           D50.0, Iron deficiency anemia secondary to blood                            loss (chronic)                           R19.5, Other fecal abnormalities CPT copyright 2018 American [employer]. All rights reserved. The codes documented in this report are preliminary and upon coder review may  be revised to meet current compliance requirements. Wonda Horner, MD 12/16/2017 11:17:35 AM This report has been signed electronically. Number of Addenda: 0

## 2017-12-16 NOTE — Op Note (Signed)
[facility] Patient Name: Jennifer Hinton Procedure Date: 12/16/2017 MRN: [id number] Attending MD: Wonda Horner , MD Date of Birth: Apr 19, 1935 CSN: [id number] Age: 82 Admit Type: Inpatient Procedure:                Colonoscopy Indications:              Heme positive stool, Iron deficiency anemia                            secondary to chronic blood loss Providers:                Wonda Horner, MD, Vista Lawman, RN, Elspeth Cho Tech., Technician, Rosario Adie, CRNA Referring MD:              Medicines:                Propofol per Anesthesia Complications:            No immediate complications. Estimated Blood Loss:     Estimated blood loss: none. Procedure:                Pre-Anesthesia Assessment:                           - Prior to the procedure, a History and Physical                            was performed, and patient medications and                            allergies were reviewed. The patient's tolerance of                            previous anesthesia was also reviewed. The risks                            and benefits of the procedure and the sedation                            options and risks were discussed with the patient.                            All questions were answered, and informed consent                            was obtained. Prior Anticoagulants: The patient has                            taken Coumadin (warfarin), last dose was 6 days                            prior to procedure. ASA Grade Assessment: III - A  patient with severe systemic disease. After                            reviewing the risks and benefits, the patient was                            deemed in satisfactory condition to undergo the                            procedure.                           - Prior to the procedure, a History and Physical                            was performed, and patient  medications and                            allergies were reviewed. The patient's tolerance of                            previous anesthesia was also reviewed. The risks                            and benefits of the procedure and the sedation                            options and risks were discussed with the patient.                            All questions were answered, and informed consent                            was obtained. Prior Anticoagulants: The patient has                            taken Coumadin (warfarin), last dose was 6 days                            prior to procedure. ASA Grade Assessment: III - A                            patient with severe systemic disease. After                            reviewing the risks and benefits, the patient was                            deemed in satisfactory condition to undergo the                            procedure.  After obtaining informed consent, the colonoscope                            was passed under direct vision. Throughout the                            procedure, the patient's blood pressure, pulse, and                            oxygen saturations were monitored continuously. The                            [id number]-H190DL ([id number]) Olympus peds colonoscope was                            introduced through the anus and advanced to the the                            cecum, identified by appendiceal orifice and                            ileocecal valve. The ileocecal valve and the                            appendiceal orifice were photographed. The                            colonoscopy was performed without difficulty. The                            patient tolerated the procedure well. The quality                            of the bowel preparation was adequate. Scope In: 10:49:31 AM Scope Out: 11:09:31 AM Scope Withdrawal Time: 0 hours 9 minutes 33 seconds  Total Procedure Duration: 0 hours  20 minutes 0 seconds  Findings:      The perianal and digital rectal examinations were normal.      A 15 mm polyp was found in the proximal ascending colon. The polyp was       semi-pedunculated. The polyp was removed with a hot snare. Resection and       retrieval were complete.      Multiple diverticula were found in the sigmoid colon and descending       colon. Impression:               - One 15 mm polyp in the proximal ascending colon,                            removed with a hot snare. Resected and retrieved.                           - Diverticulosis in the sigmoid colon and in the  descending colon. Moderate Sedation:      . Recommendation:           - Resume regular diet.                           - Continue present medications.                           - Resume Coumadin (warfarin) at prior dose in 7                            days. If possible.                           - Await pathology results.                           - Repeat colonoscopy is not recommended for                            screening purposes. Procedure Code(s):        --- Professional ---                           [phone], Colonoscopy, flexible; with removal of                            tumor(s), polyp(s), or other lesion(s) by snare                            technique Diagnosis Code(s):        --- Professional ---                           D12.2, Benign neoplasm of ascending colon                           R19.5, Other fecal abnormalities                           D50.0, Iron deficiency anemia secondary to blood                            loss (chronic)                           K57.30, Diverticulosis of large intestine without                            perforation or abscess without bleeding CPT copyright 2018 American Medical Association. All rights reserved. The codes documented in this report are preliminary and upon coder review may  be revised to meet current compliance  requirements. Wonda Horner, MD 12/16/2017 11:23:00 AM This report has been signed electronically. Number of Addenda: 0

## 2017-12-16 NOTE — Anesthesia Postprocedure Evaluation (Signed)
Anesthesia Post Note  Patient: Shacara Cozine  Procedure(s) Performed: ESOPHAGOGASTRODUODENOSCOPY (EGD) WITH PROPOFOL (N/A ) COLONOSCOPY WITH PROPOFOL (N/A ) POLYPECTOMY HOT HEMOSTASIS (ARGON PLASMA COAGULATION/BICAP) (N/A )     Patient location during evaluation: Endoscopy Anesthesia Type: MAC Level of consciousness: awake and alert Pain management: pain level controlled Vital Signs Assessment: post-procedure vital signs reviewed and stable Respiratory status: spontaneous breathing, nonlabored ventilation and respiratory function stable Cardiovascular status: stable and blood pressure returned to baseline Postop Assessment: no apparent nausea or vomiting Anesthetic complications: no    Last Vitals:  Vitals:   12/16/17 0933 12/16/17 1132  BP: (!) 161/91 113/60  Pulse: 80 74  Resp: 17 17  Temp: 36.8 C 36.5 C  SpO2: 97% 96%    Last Pain:  Vitals:   12/16/17 1132  TempSrc: Oral  PainSc: 0-No pain                 Lynda Rainwater

## 2017-12-16 NOTE — H&P (Signed)
Patient here in endo for egd and colonoscopy to evaluate for anemia.  PE: no distress, heart rrr, lungs clear, abdomen non tender IMP: anemia Plan: egd and colonoscopy

## 2017-12-17 DIAGNOSIS — N179 Acute kidney failure, unspecified: Secondary | ICD-10-CM

## 2017-12-17 DIAGNOSIS — D500 Iron deficiency anemia secondary to blood loss (chronic): Secondary | ICD-10-CM

## 2017-12-17 DIAGNOSIS — I361 Nonrheumatic tricuspid (valve) insufficiency: Secondary | ICD-10-CM

## 2017-12-17 LAB — CBC
HEMATOCRIT: 30.4 % — AB (ref 36.0–46.0)
Hemoglobin: 8.9 g/dL — ABNORMAL LOW (ref 12.0–15.0)
MCH: 25.7 pg — AB (ref 26.0–34.0)
MCHC: 29.3 g/dL — AB (ref 30.0–36.0)
MCV: 87.9 fL (ref 80.0–100.0)
Platelets: 196 10*3/uL (ref 150–400)
RBC: 3.46 MIL/uL — ABNORMAL LOW (ref 3.87–5.11)
RDW: 19.9 % — AB (ref 11.5–15.5)
WBC: 4.3 10*3/uL (ref 4.0–10.5)
nRBC: 0 % (ref 0.0–0.2)

## 2017-12-17 LAB — BASIC METABOLIC PANEL
Anion gap: 7 (ref 5–15)
BUN: 24 mg/dL — ABNORMAL HIGH (ref 8–23)
CHLORIDE: 111 mmol/L (ref 98–111)
CO2: 19 mmol/L — AB (ref 22–32)
CREATININE: 2.24 mg/dL — AB (ref 0.44–1.00)
Calcium: 8.6 mg/dL — ABNORMAL LOW (ref 8.9–10.3)
GFR calc Af Amer: 23 mL/min — ABNORMAL LOW (ref >60)
GFR calc non Af Amer: 20 mL/min — ABNORMAL LOW (ref >60)
GLUCOSE: 110 mg/dL — AB (ref 70–99)
Potassium: 4.2 mmol/L (ref 3.5–5.1)
SODIUM: 137 mmol/L (ref 135–145)

## 2017-12-17 MED ORDER — FERROUS SULFATE 325 (65 FE) MG PO TABS: 325.0000 mg | ORAL_TABLET | Freq: Every day | ORAL | Status: DC

## 2017-12-17 MED ORDER — FERROUS SULFATE 325 (65 FE) MG PO TABS: 325.0000 mg | ORAL_TABLET | Freq: Two times a day (BID) | ORAL | 3 refills | Status: AC

## 2017-12-17 MED ORDER — SODIUM CHLORIDE 0.9 % IV SOLN
510.0000 mg | Freq: Once | INTRAVENOUS | Status: AC
  Administered 2017-12-17: 510 mg via INTRAVENOUS
  Filled 2017-12-17: qty 17

## 2017-12-17 MED ORDER — POTASSIUM CHLORIDE ER 10 MEQ PO TBCR: 10.0000 meq | EXTENDED_RELEASE_TABLET | Freq: Every day | ORAL | 0 refills | Status: DC

## 2017-12-17 MED ORDER — ENSURE ENLIVE PO LIQD
237.0000 mL | Freq: Two times a day (BID) | ORAL | Status: DC
  Administered 2017-12-17 – 2017-12-19 (×4): 237 mL via ORAL

## 2017-12-17 MED ORDER — CYANOCOBALAMIN 1000 MCG/ML IJ SOLN
1000.0000 ug | Freq: Once | INTRAMUSCULAR | Status: AC
  Administered 2017-12-17: 1000 ug via INTRAMUSCULAR
  Filled 2017-12-17: qty 1

## 2017-12-17 MED ORDER — FERROUS SULFATE 325 (65 FE) MG PO TABS
325.0000 mg | ORAL_TABLET | Freq: Two times a day (BID) | ORAL | Status: DC
  Administered 2017-12-17 – 2017-12-19 (×4): 325 mg via ORAL
  Filled 2017-12-17 (×4): qty 1

## 2017-12-17 NOTE — Progress Notes (Signed)
PROGRESS NOTE    Jennifer Hinton   BTD:[id number]  DOB: [birth year]  DOA: 12/11/2017 PCP: [name], MD   Subjective: She has no complaints today   Brief Summary: 50 African-American female HTN HLD chronic diastolic heart failure pulmonary hypertension Long-standing atrial fibrillation on Coumadin Severe LVH on last echo EF 60% Degenerative joint disease with pain in both knees Hypertension ?  Rheumatoid arthritis not on any DM ARDS Admited 12/11/2017 with poor appetite, generalized weakness and DOE Found to have hemoglobin 5.1 BUN/creatinine 25/1.8 baseline creatinine 1.5. FOBT weakly +. Given vit K for INR of 4.6.   Hospital Course:  Microcytic anemia- Iron deficiency - does not eat much meat- no h/o heavy bleeding   Iron  11  Iron    UIBC  296     TIBC  307     Saturation Ratios  4     Ferritin  9     Folate  11.8      Vitamin B12  341      - transfused 4 U PRBC- Hb up to 8.2 - start oral Iron - replaced B12 with 1 s/c injection of 1000 mcg - resume Warfarin in 7 days - would give another dose of Feraheme 550 IV mg in 1-2 wks   Permanent atrial fibrillation chads score >5 -holding Coumadin- resume coumadin 12/23/17  Mild exacerbation of underlying chronic diastolic heart failure intermittently given Lasix per cardiology   Rheumatoid arthritis - follow- not on medications  Subacute weight loss-states over the past 3 weeks she has lost over 20 pounds-consider OP work-up  Pulmonary hypertension -keeping volumes even today-unclear accuracy of diuresis-cont amlodipine 10, coreg 25 bid, hydralazine 100 tid  Hypokalemia -resolved s/p replacement    DVT prophylaxis: SCDs Code Status: Full code Family Communication:  Disposition Plan: home  Consultants:   GI  Cardiology Procedures:  Colonoscopy - One 15 mm polyp in the proximal ascending colon,  removed with a hot snare. Resected and  retrieved. - Diverticulosis in the sigmoid colon and in the  descending colon. EGD - Normal esophagus. - Normal stomach. - Normal examined duodenum. - No specimens collected. Antimicrobials:  Anti-infectives (From admission, onward)   None       Objective: Vitals:   12/16/17 1150 12/16/17 1404 12/16/17 2121 12/17/17 0517  BP: 137/73 126/63 (!) 120/59 140/78  Pulse: 70 63 71 69  Resp: 19  15 15   Temp:   99 F (37.2 C) 99 F (37.2 C)  TempSrc:   Oral Oral  SpO2: 96% 97% 98% 97%  Weight:      Height:        Intake/Output Summary (Last 24 hours) at 12/17/2017 1306 Last data filed at 12/17/2017 0915 Gross per 24 hour  Intake 600 ml  Output -  Net 600 ml   Filed Weights   12/11/17 0932 12/11/17 1423  Weight: 64.9 kg 60.7 kg    Examination: General exam: Appears comfortable  HEENT: PERRLA, oral mucosa moist, no sclera icterus or thrush Respiratory system: Clear to auscultation. Respiratory effort normal. Cardiovascular system: S1 & S2 heard, RRR.   Gastrointestinal system: Abdomen soft, non-tender, nondistended. Normal bowel sounds. Central nervous system: Alert and oriented. No focal neurological deficits. Extremities: No cyanosis, clubbing or edema Skin: No rashes or ulcers Psychiatry:  Mood & affect appropriate.     Data Reviewed: I have personally reviewed following labs and imaging studies  CBC: Recent Labs  Lab 12/11/17 1034 12/12/17 0547 12/13/17 0528 12/14/17 0551 12/15/17  8921 12/17/17 1057  WBC 5.7 4.3 5.4 5.7 5.3 4.3  NEUTROABS 5.0  --  4.5 4.6 4.3  --   HGB 5.1* 6.0* 8.1* 8.1* 8.2* 8.9*  HCT 17.9* 20.6* 26.6* 27.0* 27.6* 30.4*  MCV 82.5 84.1 84.2 85.7 86.3 87.9  PLT 213 159 165 167 183 194   Basic Metabolic Panel: Recent Labs  Lab 12/13/17 0528 12/14/17 0551 12/15/17 0619 12/16/17 0644 12/17/17 1057  NA  140 139 138 137 137  K 3.3* 4.3 4.4 4.1 4.2  CL 111 111 110 110 111  CO2 20* 20* 21* 22 19*  GLUCOSE 98 110* 99 96 110*  BUN 25* 28* 23 21 24*  CREATININE 1.92* 2.14* 1.97* 1.84* 2.24*  CALCIUM 8.6* 8.5* 8.6* 8.4* 8.6*  MG 1.7 1.8  --  2.0  --   PHOS  --  2.6 2.5  --   --    GFR: Estimated Creatinine Clearance: 17.6 mL/min (A) (by C-G formula based on SCr of 2.24 mg/dL (H)). Liver Function Tests: Recent Labs  Lab 12/11/17 1034 12/14/17 0551 12/15/17 0619  AST 25  --   --   ALT 10  --   --   ALKPHOS 56  --   --   BILITOT 0.8  --   --   PROT 7.4  --   --   ALBUMIN 2.9* 2.5* 2.4*   No results for input(s): LIPASE, AMYLASE in the last 168 hours. No results for input(s): AMMONIA in the last 168 hours. Coagulation Profile: Recent Labs  Lab 12/11/17 1034 12/12/17 0547 12/15/17 0619  INR 4.46* 1.86 1.62   Cardiac Enzymes: No results for input(s): CKTOTAL, CKMB, CKMBINDEX, TROPONINI in the last 168 hours. BNP (last 3 results) No results for input(s): PROBNP in the last 8760 hours. HbA1C: No results for input(s): HGBA1C in the last 72 hours. CBG: No results for input(s): GLUCAP in the last 168 hours. Lipid Profile: No results for input(s): CHOL, HDL, LDLCALC, TRIG, CHOLHDL, LDLDIRECT in the last 72 hours. Thyroid Function Tests: No results for input(s): TSH, T4TOTAL, FREET4, T3FREE, THYROIDAB in the last 72 hours. Anemia Panel: No results for input(s): VITAMINB12, FOLATE, FERRITIN, TIBC, IRON, RETICCTPCT in the last 72 hours. Urine analysis:    Component Value Date/Time   COLORURINE YELLOW 12/12/2017 0630   APPEARANCEUR CLEAR 12/12/2017 0630   LABSPEC 1.008 12/12/2017 0630   PHURINE 6.0 12/12/2017 0630   GLUCOSEU NEGATIVE 12/12/2017 0630   HGBUR NEGATIVE 12/12/2017 0630   HGBUR trace-intact 11/30/2008 0829   BILIRUBINUR NEGATIVE 12/12/2017 0630   KETONESUR NEGATIVE 12/12/2017 0630   PROTEINUR NEGATIVE 12/12/2017 0630   UROBILINOGEN 0.2 11/30/2008 0829   NITRITE  NEGATIVE 12/12/2017 0630   LEUKOCYTESUR NEGATIVE 12/12/2017 0630   Sepsis Labs: @LABRCNTIP (procalcitonin:4,lacticidven:4) )No results found for this or any previous visit (from the past 240 hour(s)).       Radiology Studies: No results found.    Scheduled Meds: . amLODipine  10 mg Oral Daily  . carvedilol  25 mg Oral BID WC  . feeding supplement (ENSURE ENLIVE)  237 mL Oral BID BM  . ferrous sulfate  325 mg Oral BID WC  . hydrALAZINE  100 mg Oral TID  . magnesium oxide  400 mg Oral BID  . pantoprazole  40 mg Oral BID AC  . potassium chloride  40 mEq Oral Daily  . traZODone  50 mg Oral Once   Continuous Infusions: . sodium chloride       LOS: 5 days  Time spent in minutes: 35    Debbe Odea, MD [facility] Pager: [email or web address] Password [email or web address] 12/17/2017, 1:06 PM

## 2017-12-17 NOTE — Progress Notes (Signed)
No problems today post EGD and colonoscopy yesterday. We are awaiting pathology report on the ascending colon polyp that was removed. No signs of any bleeding. From a GI standpoint she appears stable. We will sign off. Call us if needed. Would wait 1 week before restarting Coumadin as I reported yesterday on my colonoscopy report.

## 2017-12-18 ENCOUNTER — Encounter (HOSPITAL_COMMUNITY): Payer: Self-pay | Admitting: Gastroenterology

## 2017-12-18 DIAGNOSIS — I272 Pulmonary hypertension, unspecified: Secondary | ICD-10-CM

## 2017-12-18 DIAGNOSIS — N179 Acute kidney failure, unspecified: Secondary | ICD-10-CM

## 2017-12-18 DIAGNOSIS — N183 Chronic kidney disease, stage 3 (moderate): Secondary | ICD-10-CM

## 2017-12-18 LAB — BASIC METABOLIC PANEL
Anion gap: 7 (ref 5–15)
BUN: 29 mg/dL — ABNORMAL HIGH (ref 8–23)
CO2: 20 mmol/L — ABNORMAL LOW (ref 22–32)
Calcium: 8.7 mg/dL — ABNORMAL LOW (ref 8.9–10.3)
Chloride: 110 mmol/L (ref 98–111)
Creatinine, Ser: 2.48 mg/dL — ABNORMAL HIGH (ref 0.44–1.00)
GFR calc non Af Amer: 17 mL/min — ABNORMAL LOW (ref >60)
GFR, EST AFRICAN AMERICAN: 20 mL/min — AB (ref >60)
Glucose, Bld: 111 mg/dL — ABNORMAL HIGH (ref 70–99)
Potassium: 4.3 mmol/L (ref 3.5–5.1)
SODIUM: 137 mmol/L (ref 135–145)

## 2017-12-18 MED ORDER — SODIUM CHLORIDE 0.9 % IV SOLN
INTRAVENOUS | Status: AC
  Administered 2017-12-18: 15:00:00 via INTRAVENOUS

## 2017-12-18 NOTE — Progress Notes (Signed)
PROGRESS NOTE    Jennifer Hinton   WJX:[id number]  DOB: 1935-07-03  DOA: 12/11/2017 PCP: [name], MD     Brief Summary: 89 African-American female HTN HLD chronic diastolic heart failure pulmonary hypertension Long-standing atrial fibrillation on Coumadin Severe LVH on last echo EF 60% Degenerative joint disease with pain in both knees Hypertension ?  Rheumatoid arthritis not on any DM ARDS Admited 12/11/2017 with poor appetite, generalized weakness and DOE Found to have hemoglobin 5.1 BUN/creatinine 25/1.8 baseline creatinine 1.5. FOBT weakly +. Given vit K for INR of 4.6.    Subjective: She has no complaints today  Hospital Course:  AKI - due to intermittently being given Lasix early on in the hospital stay - d/w cardiology, recommended to start fluids and give through out the day - I have ordered 12 hrs of NS at 75 cc/her - recheck Bmet tomorrow  Microcytic anemia- Iron deficiency - does not eat much meat- no h/o heavy bleeding   Iron  11  Iron    UIBC  296     TIBC  307     Saturation Ratios  4     Ferritin  9     Folate  11.8      Vitamin B12  341      - transfused 4 U PRBC- Hb up to 8.2 - started oral Iron - replaced B12 with 1 s/c injection of 1000 mcg - resume Warfarin in 7 days - would give another dose of Feraheme 550 IV mg in 1-2 wks   Permanent atrial fibrillation chads score >5 -holding Coumadin- resume coumadin 12/23/17  Mild exacerbation of underlying chronic diastolic heart failure intermittently given Lasix per cardiology   Rheumatoid arthritis - follow- not on medications  Subacute weight loss-states over the past 3 weeks she has lost over 20 pounds-consider OP work-up  Pulmonary hypertension -keeping volumes even today-unclear accuracy of diuresis-cont amlodipine 10, coreg 25 bid, hydralazine 100 tid  Hypokalemia -resolved s/p replacement    DVT prophylaxis: SCDs Code Status: Full code Family Communication:    Disposition Plan: home  Consultants:   GI  Cardiology Procedures:  Colonoscopy - One 15 mm polyp in the proximal ascending colon,  removed with a hot snare. Resected and retrieved. - Diverticulosis in the sigmoid colon and in the  descending colon. EGD - Normal esophagus. - Normal stomach. - Normal examined duodenum. - No specimens collected. Antimicrobials:  Anti-infectives (From admission, onward)   None       Objective: Vitals:   12/17/17 0517 12/17/17 1348 12/17/17 2014 12/18/17 0344  BP: 140/78 124/67 121/61 (!) 143/71  Pulse: 69 62 63 68  Resp: 15 15 (!) 22 18  Temp: 99 F (37.2 C) 98.2 F (36.8 C) 97.6 F (36.4 C) 98.3 F (36.8 C)  TempSrc: Oral Oral Oral Oral  SpO2: 97% 98% 98% 92%  Weight:      Height:        Intake/Output Summary (Last 24 hours) at 12/18/2017 1357 Last data filed at 12/17/2017 1858 Gross per 24 hour  Intake 340 ml  Output -  Net 340 ml   Filed Weights   12/11/17 0932 12/11/17 1423  Weight: 64.9 kg 60.7 kg    Examination: General exam: Appears comfortable  HEENT: PERRLA, oral mucosa moist, no sclera icterus or thrush Respiratory system: Clear to auscultation. Respiratory effort normal. Cardiovascular system: S1 & S2 heard,  No murmurs  Gastrointestinal system: Abdomen soft, non-tender, nondistended. Normal bowel sound. No organomegaly Central  nervous system: Alert and oriented. No focal neurological deficits. Extremities: No cyanosis, clubbing or edema Skin: No rashes or ulcers Psychiatry:  Mood & affect appropriate.     Data Reviewed: I have personally reviewed following labs and imaging studies  CBC: Recent Labs  Lab 12/12/17 0547 12/13/17 0528 12/14/17 0551 12/15/17 0619 12/17/17 1057  WBC 4.3 5.4 5.7 5.3 4.3  NEUTROABS  --  4.5 4.6 4.3  --   HGB 6.0*  8.1* 8.1* 8.2* 8.9*  HCT 20.6* 26.6* 27.0* 27.6* 30.4*  MCV 84.1 84.2 85.7 86.3 87.9  PLT 159 165 167 183 299   Basic Metabolic Panel: Recent Labs  Lab 12/13/17 0528 12/14/17 0551 12/15/17 0619 12/16/17 0644 12/17/17 1057 12/18/17 0834  NA 140 139 138 137 137 137  K 3.3* 4.3 4.4 4.1 4.2 4.3  CL 111 111 110 110 111 110  CO2 20* 20* 21* 22 19* 20*  GLUCOSE 98 110* 99 96 110* 111*  BUN 25* 28* 23 21 24* 29*  CREATININE 1.92* 2.14* 1.97* 1.84* 2.24* 2.48*  CALCIUM 8.6* 8.5* 8.6* 8.4* 8.6* 8.7*  MG 1.7 1.8  --  2.0  --   --   PHOS  --  2.6 2.5  --   --   --    GFR: Estimated Creatinine Clearance: 15.9 mL/min (A) (by C-G formula based on SCr of 2.48 mg/dL (H)). Liver Function Tests: Recent Labs  Lab 12/14/17 0551 12/15/17 0619  ALBUMIN 2.5* 2.4*   No results for input(s): LIPASE, AMYLASE in the last 168 hours. No results for input(s): AMMONIA in the last 168 hours. Coagulation Profile: Recent Labs  Lab 12/12/17 0547 12/15/17 0619  INR 1.86 1.62   Cardiac Enzymes: No results for input(s): CKTOTAL, CKMB, CKMBINDEX, TROPONINI in the last 168 hours. BNP (last 3 results) No results for input(s): PROBNP in the last 8760 hours. HbA1C: No results for input(s): HGBA1C in the last 72 hours. CBG: No results for input(s): GLUCAP in the last 168 hours. Lipid Profile: No results for input(s): CHOL, HDL, LDLCALC, TRIG, CHOLHDL, LDLDIRECT in the last 72 hours. Thyroid Function Tests: No results for input(s): TSH, T4TOTAL, FREET4, T3FREE, THYROIDAB in the last 72 hours. Anemia Panel: No results for input(s): VITAMINB12, FOLATE, FERRITIN, TIBC, IRON, RETICCTPCT in the last 72 hours. Urine analysis:    Component Value Date/Time   COLORURINE YELLOW 12/12/2017 0630   APPEARANCEUR CLEAR 12/12/2017 0630   LABSPEC 1.008 12/12/2017 0630   PHURINE 6.0 12/12/2017 0630   GLUCOSEU NEGATIVE 12/12/2017 0630   HGBUR NEGATIVE 12/12/2017 0630   HGBUR trace-intact 11/30/2008 0829    BILIRUBINUR NEGATIVE 12/12/2017 0630   KETONESUR NEGATIVE 12/12/2017 0630   PROTEINUR NEGATIVE 12/12/2017 0630   UROBILINOGEN 0.2 11/30/2008 0829   NITRITE NEGATIVE 12/12/2017 0630   LEUKOCYTESUR NEGATIVE 12/12/2017 0630   Sepsis Labs: @LABRCNTIP (procalcitonin:4,lacticidven:4) )No results found for this or any previous visit (from the past 240 hour(s)).       Radiology Studies: No results found.    Scheduled Meds: . amLODipine  10 mg Oral Daily  . carvedilol  25 mg Oral BID WC  . feeding supplement (ENSURE ENLIVE)  237 mL Oral BID BM  . ferrous sulfate  325 mg Oral BID WC  . hydrALAZINE  100 mg Oral TID  . magnesium oxide  400 mg Oral BID  . pantoprazole  40 mg Oral BID AC  . potassium chloride  40 mEq Oral Daily  . traZODone  50 mg Oral Once   Continuous  Infusions: . sodium chloride       LOS: 6 days    Time spent in minutes: 35    Debbe Odea, MD [facility] Pager: [email or web address] Password [email or web address] 12/18/2017, 1:57 PM

## 2017-12-18 NOTE — Progress Notes (Signed)
Progress Note  Patient Name: Jennifer Hinton Date of Encounter: 12/18/2017  Primary Cardiologist: Kirk Ruths, MD   Subjective   Feeling well.  Has no chest pain or shortness of breath.  Denies palpitations, lightheadedness, or dizziness.  Inpatient Medications    Scheduled Meds: . amLODipine  10 mg Oral Daily  . carvedilol  25 mg Oral BID WC  . feeding supplement (ENSURE ENLIVE)  237 mL Oral BID BM  . ferrous sulfate  325 mg Oral BID WC  . hydrALAZINE  100 mg Oral TID  . magnesium oxide  400 mg Oral BID  . pantoprazole  40 mg Oral BID AC  . potassium chloride  40 mEq Oral Daily  . traZODone  50 mg Oral Once   Continuous Infusions: . sodium chloride     PRN Meds:    Vital Signs    Vitals:   12/17/17 0517 12/17/17 1348 12/17/17 2014 12/18/17 0344  BP: 140/78 124/67 121/61 (!) 143/71  Pulse: 69 62 63 68  Resp: 15 15 (!) 22 18  Temp: 99 F (37.2 C) 98.2 F (36.8 C) 97.6 F (36.4 C) 98.3 F (36.8 C)  TempSrc: Oral Oral Oral Oral  SpO2: 97% 98% 98% 92%  Weight:      Height:        Intake/Output Summary (Last 24 hours) at 12/18/2017 1121 Last data filed at 12/17/2017 1858 Gross per 24 hour  Intake 440 ml  Output -  Net 440 ml   Filed Weights   12/11/17 0932 12/11/17 1423  Weight: 64.9 kg 60.7 kg    Telemetry    Atrial fibrillation.  Rate less than 100 bpm.  Occasional PVCs- Personally Reviewed  ECG    n/a - Personally Reviewed  Physical Exam   VS:  BP (!) 143/71   Pulse 68   Temp 98.3 F (36.8 C) (Oral)   Resp 18   Ht 5\' 4"  (1.626 m)   Wt 60.7 kg   SpO2 92%   BMI 22.97 kg/m  , BMI Body mass index is 22.97 kg/m. GENERAL:  Well appearing elderly woman in no acute distress HEENT: Pupils equal round and reactive, fundi not visualized, oral mucosa unremarkable NECK:  No jugular venous distention, waveform within normal limits, carotid upstroke brisk and symmetric, no bruits LUNGS:  Clear to auscultation bilaterally HEART: Irregularly  irregular.  PMI not displaced or sustained,S1 and S2 within normal limits, no S3, no S4, no clicks, no rubs, 2 out of 6 systolic murmurs ABD:  Flat, positive bowel sounds normal in frequency in pitch, no bruits, no rebound, no guarding, no midline pulsatile mass, no hepatomegaly, no splenomegaly EXT:  2 plus pulses throughout, no edema, no cyanosis no clubbing SKIN:  No rashes no nodules NEURO:  Cranial nerves II through XII grossly intact, motor grossly intact throughout [facility]:  Cognitively intact, oriented to person place and time   Labs    Chemistry Recent Labs  Lab 12/14/17 0551 12/15/17 0619 12/16/17 0644 12/17/17 1057 12/18/17 0834  NA 139 138 137 137 137  K 4.3 4.4 4.1 4.2 4.3  CL 111 110 110 111 110  CO2 20* 21* 22 19* 20*  GLUCOSE 110* 99 96 110* 111*  BUN 28* 23 21 24* 29*  CREATININE 2.14* 1.97* 1.84* 2.24* 2.48*  CALCIUM 8.5* 8.6* 8.4* 8.6* 8.7*  ALBUMIN 2.5* 2.4*  --   --   --   GFRNONAA 21* 23* 25* 20* 17*  GFRAA 24* 27* 29* 23* 20*  ANIONGAP 8 7 5 7 7      Hematology Recent Labs  Lab 12/14/17 0551 12/15/17 0619 12/17/17 1057  WBC 5.7 5.3 4.3  RBC 3.15* 3.20* 3.46*  HGB 8.1* 8.2* 8.9*  HCT 27.0* 27.6* 30.4*  MCV 85.7 86.3 87.9  MCH 25.7* 25.6* 25.7*  MCHC 30.0 29.7* 29.3*  RDW 18.6* 19.3* 19.9*  PLT 167 183 196    Cardiac EnzymesNo results for input(s): TROPONINI in the last 168 hours. No results for input(s): TROPIPOC in the last 168 hours.   BNPNo results for input(s): BNP, PROBNP in the last 168 hours.   DDimer No results for input(s): DDIMER in the last 168 hours.   Radiology    No results found.  Cardiac Studies   Echo 06/10/17: Study Conclusions - Procedure narrative: Transthoracic echocardiography for left ventricular function evaluation and for evaluation of pulmonary pressures. Image quality was adequate. The study was technically difficult, as a result of poor acoustic windows, restricted patient mobility, and body  habitus. - Left ventricle: The cavity size was normal. Wall thickness was increased in a pattern of moderate LVH. Systolic function was normal. The estimated ejection fraction was in the range of 60% to 65%. Wall motion was normal; there were no regional wall motion abnormalities. The study is not technically sufficient to allow evaluation of LV diastolic function. - Aortic valve: Moderately calcified annulus. Trileaflet. There was mild regurgitation. - Mitral valve: Calcified annulus. Mildly thickened leaflets . There was moderate regurgitation. - Left atrium: The atrium was massively dilated. - Right atrium: The atrium was massively dilated. - Atrial septum: No defect or patent foramen ovale was identified. - Tricuspid valve: There was severe regurgitation. - Pulmonic valve: There was mild regurgitation. - Pulmonary arteries: PA peak pressure: 77 mm Hg (S). - Systemic veins: IVC is significantly dilated with normal respiratory variation. Estimated CVP 8 mmHg.  Patient Profile     82 y.o. female  with severe pulmonary hypertension, persistent atrial fibrillation, hypertension, and chronic diastolic heart failure here with acute GI bleed.   Assessment & Plan    #Acute on chronic kidney failure: Creatinine continues to rise to 2.48 today.  It was 1.8 on admission.  She is not feeling dry, though she appears somewhat dry on exam.  She certainly has no JVD.  We will give a fluid bolus of 1 L over the course of the day.  She is not on any other nephrotoxic medications.  # Pulmonary hypertension: PASP was 77 on echo 05/2017.  She is currently euvolemic on exam.  Last dose of Lasix was 10/27.  Will give gentle fluids as above.  # Hypertension: BP controlled on amlodipine, carvedilol, and hydralazine.    # Persistent atrial fibrillation: Rate well-controlled on carvedilol.  Warfarin will be resumed 7 days post colonoscopy.  # GI bleed: Stable.  S/p removal of 1 polyp.   Warfarin to be restarted 7 days post colonoscopy.      For questions or updates, please contact [place] Please consult [email or web address] for contact info under        Signed, Skeet Latch, MD  12/18/2017, 11:21 AM

## 2017-12-18 NOTE — Care Management Important Message (Signed)
Important Message  Patient Details  Name: Jennifer Hinton MRN: [id number] Date of Birth: 1935-03-30   Medicare Important Message Given:  Yes    Kerin Salen 12/18/2017, 12:59 [place] Message  Patient Details  Name: Jennifer Hinton MRN: [id number] Date of Birth: 12-29-1935   Medicare Important Message Given:  Yes    Kerin Salen 12/18/2017, 12:59 PM

## 2017-12-19 DIAGNOSIS — I5033 Acute on chronic diastolic (congestive) heart failure: Secondary | ICD-10-CM

## 2017-12-19 DIAGNOSIS — N189 Chronic kidney disease, unspecified: Secondary | ICD-10-CM

## 2017-12-19 DIAGNOSIS — N184 Chronic kidney disease, stage 4 (severe): Secondary | ICD-10-CM

## 2017-12-19 DIAGNOSIS — I4891 Unspecified atrial fibrillation: Secondary | ICD-10-CM

## 2017-12-19 DIAGNOSIS — D649 Anemia, unspecified: Secondary | ICD-10-CM

## 2017-12-19 DIAGNOSIS — R634 Abnormal weight loss: Secondary | ICD-10-CM

## 2017-12-19 DIAGNOSIS — D509 Iron deficiency anemia, unspecified: Secondary | ICD-10-CM

## 2017-12-19 DIAGNOSIS — Z23 Encounter for immunization: Secondary | ICD-10-CM | POA: Diagnosis not present

## 2017-12-19 LAB — BASIC METABOLIC PANEL
ANION GAP: 7 (ref 5–15)
BUN: 28 mg/dL — ABNORMAL HIGH (ref 8–23)
CALCIUM: 8.4 mg/dL — AB (ref 8.9–10.3)
CO2: 19 mmol/L — ABNORMAL LOW (ref 22–32)
Chloride: 111 mmol/L (ref 98–111)
Creatinine, Ser: 2.23 mg/dL — ABNORMAL HIGH (ref 0.44–1.00)
GFR, EST AFRICAN AMERICAN: 23 mL/min — AB (ref >60)
GFR, EST NON AFRICAN AMERICAN: 20 mL/min — AB (ref >60)
Glucose, Bld: 84 mg/dL (ref 70–99)
POTASSIUM: 3.8 mmol/L (ref 3.5–5.1)
SODIUM: 137 mmol/L (ref 135–145)

## 2017-12-19 MED ORDER — SODIUM CHLORIDE 0.9 % IV BOLUS
500.0000 mL | Freq: Once | INTRAVENOUS | Status: AC
  Administered 2017-12-19: 500 mL via INTRAVENOUS

## 2017-12-19 MED ORDER — SENNA 8.6 MG PO TABS: 1.0000 | ORAL_TABLET | Freq: Every evening | ORAL | 0 refills | Status: DC | PRN

## 2017-12-19 MED ORDER — ENSURE ENLIVE PO LIQD: 237.0000 mL | Freq: Two times a day (BID) | ORAL | 12 refills | Status: AC

## 2017-12-19 MED ORDER — POLYETHYLENE GLYCOL 3350 17 G PO PACK: 17.0000 g | PACK | Freq: Every day | ORAL | 0 refills | Status: DC | PRN

## 2017-12-19 MED ORDER — BISACODYL 10 MG RE SUPP: 10.0000 mg | Freq: Every day | RECTAL | 0 refills | Status: DC | PRN

## 2017-12-19 NOTE — Progress Notes (Signed)
Patient has been refusing to to walk in the hallway. The nurse and the nurse tech have been to her room four times this morning  to encourage her to walk but she kept refusing. Dr Wynelle Cleveland  Notified.

## 2017-12-19 NOTE — Care Management Note (Signed)
Case Management Note  Patient Details  Name: Kassiah Mccrory MRN: [id number] Date of Birth: [birth year]  Subjective/Objective:                  discharged  Action/Plan: Discharged to home with self-care, orders checked for hhc needs. No CM needs present at time of discharge.  Expected Discharge Date:  12/19/17               Expected Discharge Plan:     In-House Referral:     Discharge planning Services     Post Acute Care Choice:    Choice offered to:     DME Arranged:    DME Agency:     HH Arranged:    HH Agency:     Status of Service:     If discussed at [employer], dates discussed:    Additional Comments:  Leeroy Cha, RN 12/19/2017, 2:47 PM

## 2017-12-19 NOTE — Progress Notes (Addendum)
Progress Note  Patient Name: Jennifer Hinton Date of Encounter: 12/19/2017  Primary Cardiologist: Kirk Ruths, MD   Subjective   Feeling well.  Has no chest pain or shortness of breath.  Denies palpitations, lightheadedness, or dizziness.  Inpatient Medications    Scheduled Meds: . amLODipine  10 mg Oral Daily  . carvedilol  25 mg Oral BID WC  . feeding supplement (ENSURE ENLIVE)  237 mL Oral BID BM  . ferrous sulfate  325 mg Oral BID WC  . hydrALAZINE  100 mg Oral TID  . magnesium oxide  400 mg Oral BID  . pantoprazole  40 mg Oral BID AC  . potassium chloride  40 mEq Oral Daily  . traZODone  50 mg Oral Once   Continuous Infusions: . sodium chloride 10 mL/hr (12/19/17 0300)  . sodium chloride 500 mL (12/19/17 0848)   PRN Meds:    Vital Signs    Vitals:   12/18/17 1418 12/18/17 2005 12/19/17 0455 12/19/17 0935  BP: (!) 119/59 139/70 137/66 (!) 144/74  Pulse: 76 68 76   Resp: 18 19 16    Temp: 98.3 F (36.8 C) 98.1 F (36.7 C) 98.2 F (36.8 C)   TempSrc: Oral Oral Oral   SpO2: 99% 98% 99%   Weight:      Height:        Intake/Output Summary (Last 24 hours) at 12/19/2017 1016 Last data filed at 12/18/2017 1916 Gross per 24 hour  Intake 600 ml  Output -  Net 600 ml   Filed Weights   12/11/17 0932 12/11/17 1423  Weight: 64.9 kg 60.7 kg    Telemetry    Atrial fibrillation.  Rate less than 100 bpm.  Occasional PVCs- Personally Reviewed  ECG    n/a - Personally Reviewed  Physical Exam   VS:  BP (!) 144/74   Pulse 76   Temp 98.2 F (36.8 C) (Oral)   Resp 16   Ht 5\' 4"  (1.626 m)   Wt 60.7 kg   SpO2 99%   BMI 22.97 kg/m  , BMI Body mass index is 22.97 kg/m. GENERAL:  Well appearing elderly woman in no acute distress HEENT: Pupils equal round and reactive, fundi not visualized, oral mucosa unremarkable NECK:  No jugular venous distention, waveform within normal limits, carotid upstroke brisk and symmetric, no bruits LUNGS:  Clear to auscultation  bilaterally HEART: Irregularly irregular.  PMI not displaced or sustained,S1 and S2 within normal limits, no S3, no S4, no clicks, no rubs, 2 out of 6 systolic murmurs ABD:  Flat, positive bowel sounds normal in frequency in pitch, no bruits, no rebound, no guarding, no midline pulsatile mass, no hepatomegaly, no splenomegaly EXT:  2 plus pulses throughout, no edema, no cyanosis no clubbing SKIN:  No rashes no nodules NEURO:  Cranial nerves II through XII grossly intact, motor grossly intact throughout [facility]:  Cognitively intact, oriented to person place and time   Labs    Chemistry Recent Labs  Lab 12/14/17 0551 12/15/17 0619  12/17/17 1057 12/18/17 0834 12/19/17 0555  NA 139 138   < > 137 137 137  K 4.3 4.4   < > 4.2 4.3 3.8  CL 111 110   < > 111 110 111  CO2 20* 21*   < > 19* 20* 19*  GLUCOSE 110* 99   < > 110* 111* 84  BUN 28* 23   < > 24* 29* 28*  CREATININE 2.14* 1.97*   < > 2.24*  2.48* 2.23*  CALCIUM 8.5* 8.6*   < > 8.6* 8.7* 8.4*  ALBUMIN 2.5* 2.4*  --   --   --   --   GFRNONAA 21* 23*   < > 20* 17* 20*  GFRAA 24* 27*   < > 23* 20* 23*  ANIONGAP 8 7   < > 7 7 7    < > = values in this interval not displayed.     Hematology Recent Labs  Lab 12/14/17 0551 12/15/17 0619 12/17/17 1057  WBC 5.7 5.3 4.3  RBC 3.15* 3.20* 3.46*  HGB 8.1* 8.2* 8.9*  HCT 27.0* 27.6* 30.4*  MCV 85.7 86.3 87.9  MCH 25.7* 25.6* 25.7*  MCHC 30.0 29.7* 29.3*  RDW 18.6* 19.3* 19.9*  PLT 167 183 196    Cardiac EnzymesNo results for input(s): TROPONINI in the last 168 hours. No results for input(s): TROPIPOC in the last 168 hours.   BNPNo results for input(s): BNP, PROBNP in the last 168 hours.   DDimer No results for input(s): DDIMER in the last 168 hours.   Radiology    No results found.  Cardiac Studies   Echo 06/10/17: Study Conclusions - Procedure narrative: Transthoracic echocardiography for left ventricular function evaluation and for evaluation of  pulmonary pressures. Image quality was adequate. The study was technically difficult, as a result of poor acoustic windows, restricted patient mobility, and body habitus. - Left ventricle: The cavity size was normal. Wall thickness was increased in a pattern of moderate LVH. Systolic function was normal. The estimated ejection fraction was in the range of 60% to 65%. Wall motion was normal; there were no regional wall motion abnormalities. The study is not technically sufficient to allow evaluation of LV diastolic function. - Aortic valve: Moderately calcified annulus. Trileaflet. There was mild regurgitation. - Mitral valve: Calcified annulus. Mildly thickened leaflets . There was moderate regurgitation. - Left atrium: The atrium was massively dilated. - Right atrium: The atrium was massively dilated. - Atrial septum: No defect or patent foramen ovale was identified. - Tricuspid valve: There was severe regurgitation. - Pulmonic valve: There was mild regurgitation. - Pulmonary arteries: PA peak pressure: 77 mm Hg (S). - Systemic veins: IVC is significantly dilated with normal respiratory variation. Estimated CVP 8 mmHg.  Patient Profile     82 y.o. female  with severe pulmonary hypertension, persistent atrial fibrillation, hypertension, and chronic diastolic heart failure here with acute GI bleed.   Assessment & Plan    #Acute on chronic kidney failure: Cr 2.23 now   Was 1.8 on admt   Pt can hydrate with oral intake    Permanent atrial fib   Rate controlled   Resume coumadin 7 days post colonoscopy   Will make sure she has f/u to monitor    # Pulmonary hypertension: PASP was 77 on echo 05/2017.  # Hypertension: BP is OK on amlodipine, carvedilol, and hydralazine.    # GI bleed: Stable.  S/p removal of 1 polyp.  Warfarin to be restarted 7 days post colonoscopy.      Will sign off.   I will make appt for pt in coumadin clinic as well as in  cardiology Pt will need t ostart 2.5 mg coumadin on Tuesday 11/5.   Will have coumadin appt  Later that week.   For questions or updates, please contact [place] Please consult [email or web address] for contact info under        Signed, Dorris Carnes, MD  12/19/2017, 10:16 AM

## 2017-12-19 NOTE — Progress Notes (Signed)
Patient given discharge instructions, and verbalized an understanding of all discharge instructions.  Patient agrees with discharge plan, and is being discharged in stable medical condition.  Patient given transportation via wheelchair. 

## 2017-12-19 NOTE — Discharge Summary (Addendum)
Physician Discharge Summary  Jennifer Hinton WHQ:[id number] DOB: [birth year] DOA: 12/11/2017  PCP: [name], MD  Admit date: 12/11/2017 Discharge date: 12/19/2017  Admitted From: home  Disposition:  home   Recommendations for Outpatient Follow-up:  1. Bmet in 1 wk 2. CBC in 1 month 3. Follow up on weight loss 4. Should have referral to a nephrologist- now is CKD 4  Discharge Condition:  stable   CODE STATUS:  Full code   Consultations:  Cardiology    Discharge Diagnoses:  Principal Problem:   Microcytic anemia Active Problems:   Iron deficiency anemia   Acute on chronic diastolic congestive heart failure (HCC)    AKI (acute kidney injury) Chronic renal disease, stage IV (HCC)   A-fib (HCC)   Weight loss   Gout, unspecified   HTN (hypertension)   Permanent atrial fibrillation    Pulmonary hypertension-severe   Tricuspid regurgitation-severe  Brief Summary: 85 African-American female HTN HLD chronic diastolic heart failure pulmonary hypertension Long-standing atrial fibrillation on Coumadin Severe LVH on last echo EF 60% Degenerative joint disease with pain in both knees Hypertension ? Rheumatoid arthritis not on any DM ARDS Admited 12/11/2017 with poor appetite, generalized weakness and DOE Found to have hemoglobin 5.1 BUN/creatinine 25/1.8 baseline creatinine 1.5. FOBT weakly +. Given vit K for INR of 4.6.    Hospital Course:  Hospital Course:  AKI - due to intermittently being given Lasix early on in the hospital stay - d/w cardiology, recommended to start fluids and give through out the day -  Bmet today is slightly improved- ok to d/c per cardiology- recommended to hold Lasix for 2 more days-   Microcytic anemia- Iron deficiency - FOBT weakly + - See EGD and Colonoscopy reports noted below- on major source of bleeding found - does not eat much meat- no h/o heavy bleeding               Iron  11  Iron     UIBC  296      TIBC  307       Saturation Ratios  4      Ferritin  9      Folate  11.8                 Vitamin B12  341      - transfused 4 U PRBC- Hb up to 8.2 - started oral Iron - replaced B12 with 1 s/c injection of 1000 mcg - resume Warfarin in 7 days from colonoscopy - 12/23/17 - would give another dose of Feraheme 550 IV mg in 1-2 wks  Polypectomy - path show tubular adenoma   Permanent atrial fibrillation chads score >5 -holding Coumadin- resume coumadin 12/23/17 at 2.5 mg daily- cardiology will make an appt for her to have INR checked   Mild exacerbation of underlying chronic diastolic heart failure intermittently given Lasix per cardiology   Rheumatoid arthritis - follow- not on medications  Subacute weight loss-states over the past 3 weeks she has lost over 20 pounds-consider OP work-up  Pulmonary hypertension -keeping volumes even today-unclear accuracy of diuresis-cont amlodipine 10, coreg 25 bid, hydralazine 100 tid  Hypokalemia -resolved s/p replacement    NOTE: the patient has been refusing to walk in the hall for the past few days we have been asking her to and is making random excuses. She has no trouble walking in the room. No dyspnea on exertion, at rest or with laying flat. No chest pain.  Consultants:   GI  Cardiology Procedures:  Colonoscopy - One 15 mm polyp in the proximal ascending colon,  removed with a hot snare. Resected and retrieved. - Diverticulosis in the sigmoid colon and in the  descending colon. EGD - Normal esophagus. - Normal stomach. - Normal examined duodenum. - No specimens collected.   Discharge Exam: Vitals:   12/19/17 0455 12/19/17 0935  BP: 137/66 (!) 144/74  Pulse: 76   Resp: 16   Temp: 98.2 F (36.8 C)   SpO2: 99%    Vitals:   12/18/17 1418 12/18/17  2005 12/19/17 0455 12/19/17 0935  BP: (!) 119/59 139/70 137/66 (!) 144/74  Pulse: 76 68 76   Resp: 18 19 16    Temp: 98.3 F (36.8 C) 98.1 F (36.7 C) 98.2 F (36.8 C)   TempSrc: Oral Oral Oral   SpO2: 99% 98% 99%   Weight:      Height:        General: Pt is alert, awake, not in acute distress Cardiovascular: RRR, S1/S2 +, no rubs, no gallops Respiratory: CTA bilaterally, no wheezing, no rhonchi Abdominal: Soft, NT, ND, bowel sounds + Extremities: no edema, no cyanosis   Discharge Instructions  Discharge Instructions    Diet - low sodium heart healthy   Complete by:  As directed    Increase activity slowly   Complete by:  As directed    Increase activity slowly   Complete by:  As directed      Allergies as of 12/19/2017   No Known Allergies     Medication List    STOP taking these medications   furosemide 20 MG tablet Commonly known as:  LASIX   warfarin 5 MG tablet Commonly known as:  COUMADIN     TAKE these medications   acetaminophen 650 MG CR tablet Commonly known as:  TYLENOL One tab by mouth q4-6h as needed pain in knee   amLODipine 10 MG tablet Commonly known as:  NORVASC TAKE 1 TABLET BY MOUTH EVERY DAY   bisacodyl 10 MG suppository Commonly known as:  DULCOLAX Place 1 suppository (10 mg total) rectally daily as needed for moderate constipation.   carvedilol 25 MG tablet Commonly known as:  COREG TAKE 1 TABLET BY MOUTH TWICE A DAY WITH A MEAL, What changed:    how much to take  how to take this  when to take this   feeding supplement (ENSURE ENLIVE) Liqd Take 237 mLs by mouth 2 (two) times daily between meals.   ferrous sulfate 325 (65 FE) MG tablet Take 1 tablet (325 mg total) by mouth 2 (two) times daily with a meal.   hydrALAZINE 100 MG tablet Commonly known as:  APRESOLINE Take 1 tablet (100 mg total) by mouth 3 (three) times daily.   polyethylene glycol packet Commonly known as:  MIRALAX / GLYCOLAX Take 17 g by mouth daily  as needed for mild constipation.   senna 8.6 MG Tabs tablet Commonly known as:  SENOKOT Take 1 tablet (8.6 mg total) by mouth at bedtime as needed for mild constipation.      Follow-up Information    Erlene Quan, PA-C Follow up.   Specialties:  Cardiology, Radiology Why:  Cardiology follow-up on 02/03/2018 at 1:30 PM.  Please arrive 15 minutes early for check-in. Contact information: [address] [place] [address] [place] [facility] Follow up.   Specialty:  Cardiology Why:  Please  have your Coumadin blood work done on 12/26/2017 at 1:30 PM. Contact information: [address] [employer] [place]       [name], MD. Schedule an appointment as soon as possible for a visit.   Specialty:  Family Medicine Why:  please ask you doctor to do the following blood work in 1 wk: Advertising account executive information: [place] [phone]          No Known Allergies   Procedures/Studies:    Dg Chest 2 View  Result Date: 12/11/2017 CLINICAL DATA:  Fatigue, shortness of breath, loss of appetite. EXAM: CHEST - 2 VIEW COMPARISON:  06/04/2014 FINDINGS: Markedly enlarged cardiac silhouette. Calcific atherosclerotic disease of the aorta. Mediastinal contours appear intact. No evidence of pneumothorax. Mild interstitial pulmonary edema. Small bilateral pleural effusions. Scattered peribronchial lower lobe atelectasis versus airspace consolidation. Osseous structures are without acute abnormality. Soft tissues are grossly normal. IMPRESSION: Small bilateral pleural effusions with mild interstitial pulmonary edema. Scattered peribronchial lower lobe atelectasis versus airspace consolidation. Markedly enlarged cardiac silhouette. This may be due to congestive heart failure, cardiomyopathy or pericardial effusion. Electronically Signed   By: Fidela Salisbury M.D.   On:  12/11/2017 11:16     The results of significant diagnostics from this hospitalization (including imaging, microbiology, ancillary and laboratory) are listed below for reference.     Microbiology: No results found for this or any previous visit (from the past 240 hour(s)).   Labs: BNP (last 3 results) Recent Labs    12/11/17 1034  BNP 937.1*   Basic Metabolic Panel: Recent Labs  Lab 12/13/17 0528 12/14/17 0551 12/15/17 0619 12/16/17 0644 12/17/17 1057 12/18/17 0834 12/19/17 0555  NA 140 139 138 137 137 137 137  K 3.3* 4.3 4.4 4.1 4.2 4.3 3.8  CL 111 111 110 110 111 110 111  CO2 20* 20* 21* 22 19* 20* 19*  GLUCOSE 98 110* 99 96 110* 111* 84  BUN 25* 28* 23 21 24* 29* 28*  CREATININE 1.92* 2.14* 1.97* 1.84* 2.24* 2.48* 2.23*  CALCIUM 8.6* 8.5* 8.6* 8.4* 8.6* 8.7* 8.4*  MG 1.7 1.8  --  2.0  --   --   --   PHOS  --  2.6 2.5  --   --   --   --    Liver Function Tests: Recent Labs  Lab 12/14/17 0551 12/15/17 0619  ALBUMIN 2.5* 2.4*   No results for input(s): LIPASE, AMYLASE in the last 168 hours. No results for input(s): AMMONIA in the last 168 hours. CBC: Recent Labs  Lab 12/13/17 0528 12/14/17 0551 12/15/17 0619 12/17/17 1057  WBC 5.4 5.7 5.3 4.3  NEUTROABS 4.5 4.6 4.3  --   HGB 8.1* 8.1* 8.2* 8.9*  HCT 26.6* 27.0* 27.6* 30.4*  MCV 84.2 85.7 86.3 87.9  PLT 165 167 183 196   Cardiac Enzymes: No results for input(s): CKTOTAL, CKMB, CKMBINDEX, TROPONINI in the last 168 hours. BNP: Invalid input(s): POCBNP CBG: No results for input(s): GLUCAP in the last 168 hours. D-Dimer No results for input(s): DDIMER in the last 72 hours. Hgb A1c No results for input(s): HGBA1C in the last 72 hours. Lipid Profile No results for input(s): CHOL, HDL, LDLCALC, TRIG, CHOLHDL, LDLDIRECT in the last 72 hours. Thyroid function studies No results for input(s): TSH, T4TOTAL, T3FREE, THYROIDAB in the last 72 hours.  Invalid input(s): FREET3 Anemia work up No results for  input(s): VITAMINB12, FOLATE, FERRITIN, TIBC, IRON,  RETICCTPCT in the last 72 hours. Urinalysis    Component Value Date/Time   COLORURINE YELLOW 12/12/2017 0630   APPEARANCEUR CLEAR 12/12/2017 0630   LABSPEC 1.008 12/12/2017 0630   PHURINE 6.0 12/12/2017 0630   GLUCOSEU NEGATIVE 12/12/2017 0630   HGBUR NEGATIVE 12/12/2017 0630   HGBUR trace-intact 11/30/2008 0829   BILIRUBINUR NEGATIVE 12/12/2017 0630   KETONESUR NEGATIVE 12/12/2017 0630   PROTEINUR NEGATIVE 12/12/2017 0630   UROBILINOGEN 0.2 11/30/2008 0829   NITRITE NEGATIVE 12/12/2017 0630   LEUKOCYTESUR NEGATIVE 12/12/2017 0630   Sepsis Labs Invalid input(s): PROCALCITONIN,  WBC,  LACTICIDVEN Microbiology No results found for this or any previous visit (from the past 240 hour(s)).   Time coordinating discharge in minutes: 60  SIGNED:   Debbe Odea, MD  [facility] 12/19/2017, 1:11 PM Pager   If 7PM-7AM, please contact night-coverage [email or web address] Password [email or web address]

## 2017-12-19 NOTE — Discharge Instructions (Signed)
Do not take Furosemide/Lasix for the next 2 days and then resume. Do not take potassium while you are not taking Lasix and resume on the day you start Lasix Resume Coumadin on 12/23/17 at a dose of 2.5 mg daily- Have your INR checked after starting the medication- the cardiologist will make the appt for you but you should call to make sure you have an appt. The polyp that was removed from your colon was negative for cancer. Monitor stools for blood. If you have constipation from the Iron tabs, please take laxatives as mentioned on your medication list.  You were cared for by a hospitalist during your hospital stay. If you have any questions about your discharge medications or the care you received while you were in the hospital after you are discharged, you can call the unit and asked to speak with the hospitalist on call if the hospitalist that took care of you is not available. Once you are discharged, your primary care physician will handle any further medical issues.   Please note that NO REFILLS for any discharge medications will be authorized once you are discharged, as it is imperative that you return to your primary care physician (or establish a relationship with a primary care physician if you do not have one) for your aftercare needs so that they can reassess your need for medications and monitor your lab values.  Please take all your medications with you for your next visit with your Primary MD. Please ask your Primary MD to get all Hospital records sent to his/her office. Please request your Primary MD to go over all hospital test results at the follow up.   If you experience worsening of your admission symptoms, develop shortness of breath, chest pain, suicidal or homicidal thoughts or a life threatening emergency, you must seek medical attention immediately by calling 911 or calling your MD.   Dennis Bast must read the complete instructions/literature along with all the possible adverse  reactions/side effects for all the medicines you take including new medications that have been prescribed to you. Take new medicines after you have completely understood and accpet all the possible adverse reactions/side effects.    Do not drive when taking pain medications or sedatives.     Do not take more than prescribed Pain, Sleep and Anxiety Medications   If you have smoked or chewed Tobacco in the last 2 yrs please stop. Stop any regular alcohol  and or recreational drug use.   Wear Seat belts while driving.

## 2017-12-20 ENCOUNTER — Other Ambulatory Visit: Payer: Self-pay | Admitting: Physician Assistant

## 2017-12-23 ENCOUNTER — Other Ambulatory Visit: Payer: Self-pay | Admitting: Cardiology

## 2017-12-23 DIAGNOSIS — I10 Essential (primary) hypertension: Secondary | ICD-10-CM

## 2017-12-26 ENCOUNTER — Ambulatory Visit (INDEPENDENT_AMBULATORY_CARE_PROVIDER_SITE_OTHER): Payer: Medicare Other | Admitting: Pharmacist

## 2017-12-26 DIAGNOSIS — Z5181 Encounter for therapeutic drug level monitoring: Secondary | ICD-10-CM

## 2017-12-26 DIAGNOSIS — I4891 Unspecified atrial fibrillation: Secondary | ICD-10-CM | POA: Diagnosis not present

## 2017-12-26 DIAGNOSIS — I4821 Permanent atrial fibrillation: Secondary | ICD-10-CM

## 2017-12-26 LAB — POCT INR: INR: 1.1 — AB (ref 2.0–3.0)

## 2017-12-26 NOTE — Patient Instructions (Addendum)
Description   Take an extra 1/2 tablet today, then resume normal dose of 1 tablet daily except 1/2 tablet each Monday, Wednesday and Friday.  Repeat INR in 1 week.

## 2018-01-02 ENCOUNTER — Ambulatory Visit (INDEPENDENT_AMBULATORY_CARE_PROVIDER_SITE_OTHER): Payer: Medicare Other | Admitting: Pharmacist

## 2018-01-02 DIAGNOSIS — I4891 Unspecified atrial fibrillation: Secondary | ICD-10-CM | POA: Diagnosis not present

## 2018-01-02 DIAGNOSIS — Z5181 Encounter for therapeutic drug level monitoring: Secondary | ICD-10-CM

## 2018-01-02 DIAGNOSIS — I4821 Permanent atrial fibrillation: Secondary | ICD-10-CM

## 2018-01-02 LAB — POCT INR: INR: 1.6 — AB (ref 2.0–3.0)

## 2018-01-02 NOTE — Patient Instructions (Signed)
Description   Take an extra 1/2 tablet today and tomorrow, then resume normal dose of 1 tablet daily except 1/2 tablet each Monday, Wednesday and Friday.  Repeat INR in 1 week.

## 2018-01-07 ENCOUNTER — Ambulatory Visit (INDEPENDENT_AMBULATORY_CARE_PROVIDER_SITE_OTHER): Payer: Medicare Other | Admitting: Family Medicine

## 2018-01-07 ENCOUNTER — Encounter: Payer: Self-pay | Admitting: Family Medicine

## 2018-01-07 ENCOUNTER — Other Ambulatory Visit: Payer: Self-pay

## 2018-01-07 VITALS — BP 140/60 | HR 85 | Temp 98.2°F | Wt 130.4 lb

## 2018-01-07 DIAGNOSIS — N184 Chronic kidney disease, stage 4 (severe): Secondary | ICD-10-CM | POA: Diagnosis not present

## 2018-01-07 DIAGNOSIS — Z5181 Encounter for therapeutic drug level monitoring: Secondary | ICD-10-CM | POA: Diagnosis not present

## 2018-01-07 DIAGNOSIS — D509 Iron deficiency anemia, unspecified: Secondary | ICD-10-CM | POA: Diagnosis not present

## 2018-01-07 DIAGNOSIS — I5032 Chronic diastolic (congestive) heart failure: Secondary | ICD-10-CM

## 2018-01-07 DIAGNOSIS — I4821 Permanent atrial fibrillation: Secondary | ICD-10-CM

## 2018-01-07 LAB — POCT INR: INR: 2.5 (ref 2.0–3.0)

## 2018-01-07 MED ORDER — APIXABAN 2.5 MG PO TABS: 2.5000 mg | ORAL_TABLET | Freq: Two times a day (BID) | ORAL | 1 refills | Status: DC

## 2018-01-07 NOTE — Assessment & Plan Note (Addendum)
Given recent hospitalization for anemia with supratherapeutic INR, it was decided patient would be a good candidate for transition for DOAC.  Patient is almost 13 with a creatinine greater than 1.5 and a weight less than 60 kg and therefore will need to be started on reduced dose Eliquis 2.5 mg twice daily.  She will follow-up with cardiology.  She is currently rate controlled and will not make any changes to current medication regimen.  Repeat INR today was 2.5.  Will initiate Eliquis.

## 2018-01-07 NOTE — Assessment & Plan Note (Addendum)
Patient kidney function has gradually decrease in the past 2 years with GFR declining from 54 to upper 20s.  Last creatinine was 2.23.  Currently CKD4, will need nephrology referral.  Furosemide was discontinued. --Nephrology referral placed. --Order CMP

## 2018-01-07 NOTE — Patient Instructions (Addendum)
It was great seeing you today! We have addressed the following issues today  1. Make an appointment to see your cardiologist (heart doctor). 2. I will put in a referral to the kidney doctor (nephrology). 3. You will stop taking warfarin and we will switch you Eliquis/Xarelto. 4. I will follow up on today's blood work result 5. Make an appointment to see me in about a month. 6. Your INR today is 2.5.  If we did any lab work today, and the results require attention, either me or my nurse will get in touch with you. If everything is normal, you will get a letter in mail and a message via . If you don't hear from [place] in two weeks, please give [place] a call. Otherwise, we look forward to seeing you again at your next visit. If you have any questions or concerns before then, please call the clinic at [phone].  Please bring all your medications to every doctors visit  Sign up for My Chart to have easy access to your labs results, and communication with your Primary care physician. Please ask Front Desk for some assistance.   Please check-out at the front desk before leaving the clinic.    Take Care,   Dr. Andy Gauss

## 2018-01-07 NOTE — Assessment & Plan Note (Signed)
Anemia panel on admission showed iron deficiency anemia with low B12 low ferritin and low iron saturation. Patient denies any shortness of breath, fatigue, tarry stool or evidence of bleeding.  She has resumed her Coumadin.  INR today is 2.5.  We will transition to Eliquis.  She will continue current iron therapy.  Did not receive Feraheme infusion as recommended prior to discharge.  Will recheck CBC today.  Received B12 injection prior to discharge. --Order CBC with differential

## 2018-01-07 NOTE — Assessment & Plan Note (Signed)
Euvolemic on exam.  Furosemide was held on discharge given AKI and worsening kidney function.  Patient will follow-up with cardiology to decide if she is to continue diuretic.  Last echo in April 2019 showed an EF of 60 to 65% with elevated PA pressure.

## 2018-01-07 NOTE — Progress Notes (Signed)
Subjective:    Patient ID: Jennifer Hinton, female    DOB: [birth year], 82 y.o.   MRN: [id number]   CC: Hospital discharge follow up   HPI: Patient is a 82 yo female who presents today for a hospital discharge follow up. Patient was recently admitted at [facility] for Anemia (~5.0) and AKI. Patient's INR was supratherapeuti (4.46) while on warfarin.  She received vitamin K and 4 units of packed RBC during hospitalization. Patient had a colonoscopy with one polyp resected (tubular adenoma), and EGD which was negative. Patient's coumadin was initially held but was resumed two days after colonoscopy. Patient  also had and AKI thought to be secondary to furosemide in the setting of CKD and anemia. Since discharge, patient has been feeling well she is on iron therapy but has not had any ferraheme infusion as recommended prior to her discharge. She has an cardiology appointment scheduled. Last INR check on 11/15 was 1.6 subtherapeutic and is due for her next check in two days (11/22).  Patient will also need to establish care with nephrology given progression of CKD.  Smoking status reviewed   ROS: all other systems were reviewed and are negative other than in the HPI   Past Medical History:  Diagnosis Date  . Atrial fibrillation ([place])   . CHF (congestive heart failure) ([place])   . Dysrhythmia    A FIB   . Hyperlipidemia   . Hypertension   . RA (rheumatoid arthritis) ([place])     Past Surgical History:  Procedure Laterality Date  . COLONOSCOPY WITH PROPOFOL N/A 12/16/2017   Procedure: COLONOSCOPY WITH PROPOFOL;  Surgeon: Wonda Horner, MD;  Location: [facility] ENDOSCOPY;  Service: Endoscopy;  Laterality: N/A;  . ESOPHAGOGASTRODUODENOSCOPY (EGD) WITH PROPOFOL N/A 12/16/2017   Procedure: ESOPHAGOGASTRODUODENOSCOPY (EGD) WITH PROPOFOL;  Surgeon: Wonda Horner, MD;  Location: [facility] ENDOS[facility];  Service: Endoscopy;  Laterality: N/A;  . NO PAST SURGERIES    . POLYPECTOMY  12/16/2017   Procedure:  POLYPECTOMY;  Surgeon: Wonda Horner, MD;  Location: [facility];  Service: Endoscopy;;    Past medical history, surgical, family, and social history reviewed and updated in the EMR as appropriate.  Objective:  BP 140/60   Pulse 85   Temp 98.2 F (36.8 C) (Oral)   Wt 130 lb 6 oz (59.1 kg)   SpO2 94%   BMI 22.38 kg/m   Vitals and nursing note reviewed  General: NAD, pleasant, able to participate in exam Cardiac: RRR, normal heart sounds, no murmurs. 2+ radial and PT pulses bilaterally Respiratory: CTAB, normal effort, No wheezes, rales or rhonchi Abdomen: soft, nontender, nondistended, no hepatic or splenomegaly, +BS Extremities: no edema or cyanosis. WWP. Skin: warm and dry, no rashes noted Neuro: alert and oriented x4, no focal deficits Psych: Normal affect and mood   Assessment & Plan:   Permanent atrial fibrillation Given recent hospitalization for anemia with supratherapeutic INR, it was decided patient would be a good candidate for transition for DOAC.  Patient is almost 81 with a creatinine greater than 1.5 and a weight less than 60 kg and therefore will need to be started on reduced dose Eliquis 2.5 mg twice daily.  She will follow-up with cardiology.  She is currently rate controlled and will not make any changes to current medication regimen.  Repeat INR today was 2.5.  Will initiate Eliquis.  Chronic diastolic congestive heart failure Euvolemic on exam.  Furosemide was held on discharge given AKI and worsening kidney function.  Patient will follow-up with cardiology to decide if she is to continue diuretic.  Last echo in April 2019 showed an EF of 60 to 65% with elevated PA pressure.  CKD (chronic kidney disease) stage 4, GFR 15-29 ml/min (HCC) Patient kidney function has gradually decrease in the past 2 years with GFR declining from 54 to upper 20s.  Last creatinine was 2.23.  Currently CKD4, will need nephrology referral.  Furosemide was discontinued. --Nephrology  referral placed. --Order CMP  Microcytic anemia Anemia panel on admission showed iron deficiency anemia with low B12 low ferritin and low iron saturation. Patient denies any shortness of breath, fatigue, tarry stool or evidence of bleeding.  She has resumed her Coumadin.  INR today is 2.5.  We will transition to Eliquis.  She will continue current iron therapy.  Did not receive Feraheme infusion as recommended prior to discharge.  Will recheck CBC today.  Received B12 injection prior to discharge. --Order CBC with differential     [name], MD [place] PGY-3

## 2018-01-08 LAB — CBC WITH DIFFERENTIAL/PLATELET
BASOS: 1 %
Basophils Absolute: 0 10*3/uL (ref 0.0–0.2)
EOS (ABSOLUTE): 0 10*3/uL (ref 0.0–0.4)
Eos: 1 %
Hematocrit: 32.1 % — ABNORMAL LOW (ref 34.0–46.6)
Hemoglobin: 10.2 g/dL — ABNORMAL LOW (ref 11.1–15.9)
IMMATURE GRANS (ABS): 0 10*3/uL (ref 0.0–0.1)
Immature Granulocytes: 0 %
LYMPHS: 11 %
Lymphocytes Absolute: 0.5 10*3/uL — ABNORMAL LOW (ref 0.7–3.1)
MCH: 27.1 pg (ref 26.6–33.0)
MCHC: 31.8 g/dL (ref 31.5–35.7)
MCV: 85 fL (ref 79–97)
MONOS ABS: 0.2 10*3/uL (ref 0.1–0.9)
Monocytes: 5 %
NEUTROS ABS: 3.4 10*3/uL (ref 1.4–7.0)
Neutrophils: 82 %
PLATELETS: 186 10*3/uL (ref 150–450)
RBC: 3.76 x10E6/uL — ABNORMAL LOW (ref 3.77–5.28)
RDW: 21.9 % — AB (ref 12.3–15.4)
WBC: 4.1 10*3/uL (ref 3.4–10.8)

## 2018-01-08 LAB — CMP14+EGFR
A/G RATIO: 0.8 — AB (ref 1.2–2.2)
ALT: 10 IU/L (ref 0–32)
AST: 32 IU/L (ref 0–40)
Albumin: 3.6 g/dL (ref 3.5–4.8)
Alkaline Phosphatase: 62 IU/L (ref 39–117)
BILIRUBIN TOTAL: 0.8 mg/dL (ref 0.0–1.2)
BUN/Creatinine Ratio: 15 (ref 12–28)
BUN: 25 mg/dL (ref 8–27)
CALCIUM: 9.3 mg/dL (ref 8.7–10.3)
CHLORIDE: 104 mmol/L (ref 96–106)
CO2: 18 mmol/L — ABNORMAL LOW (ref 20–29)
Creatinine, Ser: 1.69 mg/dL — ABNORMAL HIGH (ref 0.57–1.00)
GFR calc Af Amer: 33 mL/min/{1.73_m2} — ABNORMAL LOW (ref >59)
GFR, EST NON AFRICAN AMERICAN: 28 mL/min/{1.73_m2} — AB (ref >59)
GLOBULIN, TOTAL: 4.5 g/dL (ref 1.5–4.5)
Glucose: 100 mg/dL — ABNORMAL HIGH (ref 65–99)
POTASSIUM: 4.1 mmol/L (ref 3.5–5.2)
SODIUM: 137 mmol/L (ref 134–144)
TOTAL PROTEIN: 8.1 g/dL (ref 6.0–8.5)

## 2018-01-11 ENCOUNTER — Other Ambulatory Visit: Payer: Self-pay | Admitting: Cardiology

## 2018-02-02 ENCOUNTER — Encounter: Payer: Self-pay | Admitting: Physician Assistant

## 2018-02-02 ENCOUNTER — Telehealth: Payer: Self-pay | Admitting: *Deleted

## 2018-02-02 NOTE — Telephone Encounter (Signed)
Thank you got verifying that.   Jennifer Hinton

## 2018-02-02 NOTE — Progress Notes (Signed)
Cardiology Office Note   Date:  02/03/2018   ID:  Jennifer Hinton, DOB 1935-05-03, MRN [id number]  PCP:  [name], MD Cardiologist:  Kirk Ruths, MD 05/09/2015 Rosaria Ferries, PA-C   Chief Complaint  Patient presents with  . Follow-up    History of Present Illness: Jennifer Hinton is a 82 y.o. female with a history of perm Afib on coumadin, HTN, HLD, RA, D-CHF, severe pulm HTN, CKD IV  Admitted 10/24-11/02/2017 w/ +FOB, Hgb 5.1 w/ no major source of bleeding found, +iron def, INR 4.6, had AKI on CKD due to Lasix, restart coumadin 11/05, after d/c 11/20, PCP transitioned her to [place] presents for cardiology follow up.  She had a little nasal bleeding, just once since Eliquis was started. No dark, tarry stools.   No LE edema, no orthopnea, no PND. She sleeps in a recliner but lays it out almost flat. Uses a walker when she goes out, uses a cane at home .Feels her breathing is fine.  Activity level is not very high, but she is willing to increase it by going to the grocery store, etc. to get out more.  No palpitations, no awareness of irreg HR  No chest pain.   Has not been lightheaded or dizzy.  No presyncope or syncope.   Past Medical History:  Diagnosis Date  . Anemia due to blood loss, acute 11/2017  . Atrial fibrillation, permanent   . Chronic diastolic CHF (congestive heart failure) ([place])   . Hyperlipidemia   . Hypertension   . Pulmonary hypertension ([place])   . RA (rheumatoid arthritis) ([place])     Past Surgical History:  Procedure Laterality Date  . COLONOSCOPY WITH PROPOFOL N/A 12/16/2017   Procedure: COLONOSCOPY WITH PROPOFOL;  Surgeon: Wonda Horner, MD;  Location: [facility];  Service: Endoscopy;  Laterality: N/A;  . ESOPHAGOGASTRODUODENOSCOPY (EGD) WITH PROPOFOL N/A 12/16/2017   Procedure: ESOPHAGOGASTRODUODENOSCOPY (EGD) WITH PROPOFOL;  Surgeon: Wonda Horner, MD;  Location: [facility];  Service: Endoscopy;  Laterality: N/A;    . NO PAST SURGERIES    . POLYPECTOMY  12/16/2017   Procedure: POLYPECTOMY;  Surgeon: Wonda Horner, MD;  Location: [facility];  Service: Endoscopy;;    Current Outpatient Medications  Medication Sig Dispense Refill  . acetaminophen (TYLENOL) 650 MG CR tablet One tab by mouth q4-6h as needed pain in knee     . amLODipine (NORVASC) 10 MG tablet TAKE 1 TABLET BY MOUTH EVERY DAY 90 tablet 2  . apixaban (ELIQUIS) 2.5 MG TABS tablet Take 1 tablet (2.5 mg total) by mouth 2 (two) times daily. 60 tablet 1  . carvedilol (COREG) 25 MG tablet TAKE 1 TABLET BY MOUTH TWICE A DAY WITH A MEAL, (Patient taking differently: Take 25 mg by mouth 2 (two) times daily with a meal. TAKE 1 TABLET BY MOUTH TWICE A DAY WITH A MEAL,) 180 tablet 2  . feeding supplement, ENSURE ENLIVE, (ENSURE ENLIVE) LIQD Take 237 mLs by mouth 2 (two) times daily between meals. (Patient taking differently: Take 237 mLs by mouth daily. ) 237 mL 12  . hydrALAZINE (APRESOLINE) 100 MG tablet TAKE 1 TABLET (100 MG TOTAL) BY MOUTH 3 (THREE) TIMES DAILY. 270 tablet 0  . bisacodyl (DULCOLAX) 10 MG suppository Place 1 suppository (10 mg total) rectally daily as needed for moderate constipation. (Patient not taking: Reported on 02/03/2018) 12 suppository 0  . ferrous sulfate 325 (65 FE) MG tablet Take 1 tablet (325 mg total) by mouth  2 (two) times daily with a meal. (Patient not taking: Reported on 02/03/2018) 60 tablet 3  . polyethylene glycol (MIRALAX) packet Take 17 g by mouth daily as needed for mild constipation. (Patient not taking: Reported on 02/03/2018) 14 each 0  . senna (SENOKOT) 8.6 MG TABS tablet Take 1 tablet (8.6 mg total) by mouth at bedtime as needed for mild constipation. (Patient not taking: Reported on 02/03/2018) 120 each 0  . warfarin (COUMADIN) 5 MG tablet TAKE 1/2 TO 1 TABLET BY MOUTH DAILY AS DIRECTED BY COUMADIN CLINIC (Patient not taking: Reported on 02/03/2018) 90 tablet 1   No current facility-administered medications  for this visit.     Allergies:   Patient has no known allergies.    Social History:  The patient  reports that she has never smoked. She has never used smokeless tobacco. She reports that she does not drink alcohol or use drugs.   Family History:  The patient's family history includes Heart disease in her brother; Hyperlipidemia in her brother.  She indicated that her mother is deceased. She indicated that her father is deceased. She indicated that the status of her brother is unknown. She indicated that her maternal grandmother is deceased. She indicated that her maternal grandfather is deceased. She indicated that her paternal grandmother is deceased. She indicated that her paternal grandfather is deceased. She indicated that her daughter is alive.    ROS:  Please see the history of present illness. All other systems are reviewed and negative.    PHYSICAL EXAM: VS:  BP 136/68   Pulse 72   Ht 5\' 4"  (1.626 m)   Wt 130 lb 9.6 oz (59.2 kg)   BMI 22.42 kg/m  , BMI Body mass index is 22.42 kg/m. GEN: Well nourished, well developed, female in no acute distress HEENT: normal for age  Neck: Superficial JVD, no carotid bruit, no masses Cardiac: Irregular rate and rhythm; no murmur, no rubs, or gallops Respiratory: Decreased breath sounds bases bilaterally, normal work of breathing GI: soft, nontender, nondistended, + BS MS: no deformity or atrophy; trace lower extremity edema; distal pulses are 2+ in all 4 extremities  Skin: warm and dry, no rash Neuro:  Strength and sensation are intact Psych: euthymic mood, full affect   EKG:  EKG is not ordered today.   ECHO: 06/10/2017 - Procedure narrative: Transthoracic echocardiography for left   ventricular function evaluation and for evaluation of pulmonary   pressures. Image quality was adequate. The study was technically   difficult, as a result of poor acoustic windows, restricted   patient mobility, and body habitus. - Left  ventricle: The cavity size was normal. Wall thickness was   increased in a pattern of moderate LVH. Systolic function was   normal. The estimated ejection fraction was in the range of 60%   to 65%. Wall motion was normal; there were no regional wall   motion abnormalities. The study is not technically sufficient to   allow evaluation of LV diastolic function. - Aortic valve: Moderately calcified annulus. Trileaflet. There was   mild regurgitation. - Mitral valve: Calcified annulus. Mildly thickened leaflets .   There was moderate regurgitation. - Left atrium: The atrium was massively dilated. - Right atrium: The atrium was massively dilated. - Atrial septum: No defect or patent foramen ovale was identified. - Tricuspid valve: There was severe regurgitation. - Pulmonic valve: There was mild regurgitation. - Pulmonary arteries: PA peak pressure: 77 mm Hg (S). - Systemic veins: IVC is  significantly dilated with normal   respiratory variation. Estimated CVP 8 mmHg   Recent Labs: 05/27/2017: TSH 1.760 12/11/2017: B Natriuretic Peptide 851.6 12/16/2017: Magnesium 2.0 01/07/2018: ALT 10; BUN 25; Creatinine, Ser 1.69; Hemoglobin 10.2; Platelets 186; Potassium 4.1; Sodium 137  CBC    Component Value Date/Time   WBC 4.1 01/07/2018 0910   WBC 4.3 12/17/2017 1057   RBC 3.76 (L) 01/07/2018 0910   RBC 3.46 (L) 12/17/2017 1057   HGB 10.2 (L) 01/07/2018 0910   HCT 32.1 (L) 01/07/2018 0910   PLT 186 01/07/2018 0910   MCV 85 01/07/2018 0910   MCH 27.1 01/07/2018 0910   MCH 25.7 (L) 12/17/2017 1057   MCHC 31.8 01/07/2018 0910   MCHC 29.3 (L) 12/17/2017 1057   RDW 21.9 (H) 01/07/2018 0910   LYMPHSABS 0.5 (L) 01/07/2018 0910   MONOABS 0.3 12/15/2017 0619   EOSABS 0.0 01/07/2018 0910   BASOSABS 0.0 01/07/2018 0910   CMP Latest Ref Rng & Units 01/07/2018 12/19/2017 12/18/2017  Glucose 65 - 99 mg/dL 100(H) 84 111(H)  BUN 8 - 27 mg/dL 25 28(H) 29(H)  Creatinine 0.57 - 1.00 mg/dL 1.69(H) 2.23(H)  2.48(H)  Sodium 134 - 144 mmol/L 137 137 137  Potassium 3.5 - 5.2 mmol/L 4.1 3.8 4.3  Chloride 96 - 106 mmol/L 104 111 110  CO2 20 - 29 mmol/L 18(L) 19(L) 20(L)  Calcium 8.7 - 10.3 mg/dL 9.3 8.4(L) 8.7(L)  Total Protein 6.0 - 8.5 g/dL 8.1 - -  Total Bilirubin 0.0 - 1.2 mg/dL 0.8 - -  Alkaline Phos 39 - 117 IU/L 62 - -  AST 0 - 40 IU/L 32 - -  ALT 0 - 32 IU/L 10 - -     Lipid Panel Lab Results  Component Value Date   CHOL 156 05/27/2017   HDL 56 05/27/2017   LDLCALC 88 05/27/2017   LDLDIRECT 78 09/10/2011   TRIG 59 05/27/2017   CHOLHDL 2.8 05/27/2017      Wt Readings from Last 3 Encounters:  02/03/18 130 lb 9.6 oz (59.2 kg)  01/07/18 130 lb 6 oz (59.1 kg)  12/11/17 133 lb 13.1 oz (60.7 kg)     Other studies Reviewed: Additional studies/ records that were reviewed today include: Office notes, hospital records and testing.  ASSESSMENT AND PLAN:  1.  Chronic diastolic CHF: She is not been weighing daily but is willing to start. - Her weight tomorrow morning will be her dry weight - She takes Lasix 40 mg only for a gain of 3 pounds in a day or 5 pounds in a week.  The family understands that if she is taking it regularly, they need to contact us. -Limit sodium to 500 mg per meal, limit all fluids to 1.5-2 L daily. -She is encouraged to increase activity by going to the grocery store and doing other things that get her out and walking more. - Contact us for worsening symptoms.  2.  Permanent atrial fibrillation: - Her vital signs are stable today, no change in medical therapy.  3.  Chronic anticoagulation: She had a minimal nosebleed last night with the Eliquis, first time. -Okay to use saline nasal spray, she says she has allergies and they have been acting up.  4.  Chronic kidney disease, stage III: She has not yet seen the renal physician, does not have an appointment - I explained that when she has to take Lasix because of her breathing, it stresses her kidneys. -  However, her kidney function  had improved significantly when rechecked by her PCP - We will check labs today, if they are stable, okay to hold off on seeing the Nephrologist, follow-up with PCP  5.  Acute blood loss anemia: -She required transfusions when hospitalized - Her PCP checked her labs on 11/20, her hemoglobin had improved to 10.2 -She has not had labs done since being started on the Eliquis, check a CBC today   Current medicines are reviewed at length with the patient today.  The patient does not have concerns regarding medicines.  The following changes have been made:  no change  Labs/ tests ordered today include:   Orders Placed This Encounter  Procedures  . Basic metabolic panel  . CBC    Disposition:   FU with Kirk Ruths, MD  Signed, Rosaria Ferries, PA-C  02/03/2018 2:27 PM    [place] Phone: [phone]; Fax: [phone]

## 2018-02-02 NOTE — Telephone Encounter (Signed)
Spoke with patient and she is not taking warfarin and hasn't picked any up.  She is only taking Eliquis.  Jazmin Hartsell,CMA

## 2018-02-02 NOTE — Telephone Encounter (Signed)
-----   Message from [name], MD sent at 02/01/2018 11:31 PM EST ----- Jazmin,  Can you check with this patient to make sure that she is only taking eliquis. She had a refill of her warfarin on 11/25 after she saw me in clinic on 11/20.   Thanks  [name], MD [place], PGY-3

## 2018-02-02 NOTE — Telephone Encounter (Signed)
Called patient but there was no answer.  Will continue to try and reach and ask about her medication use. Jennifer Hinton,CMA

## 2018-02-03 ENCOUNTER — Ambulatory Visit (INDEPENDENT_AMBULATORY_CARE_PROVIDER_SITE_OTHER): Payer: Medicare Other | Admitting: Physician Assistant

## 2018-02-03 ENCOUNTER — Encounter (INDEPENDENT_AMBULATORY_CARE_PROVIDER_SITE_OTHER): Payer: Self-pay

## 2018-02-03 ENCOUNTER — Ambulatory Visit: Payer: Medicare Other | Admitting: Cardiology

## 2018-02-03 ENCOUNTER — Encounter: Payer: Self-pay | Admitting: Physician Assistant

## 2018-02-03 VITALS — BP 136/68 | HR 72 | Ht 64.0 in | Wt 130.6 lb

## 2018-02-03 DIAGNOSIS — I5032 Chronic diastolic (congestive) heart failure: Secondary | ICD-10-CM

## 2018-02-03 DIAGNOSIS — I4821 Permanent atrial fibrillation: Secondary | ICD-10-CM

## 2018-02-03 DIAGNOSIS — D62 Acute posthemorrhagic anemia: Secondary | ICD-10-CM | POA: Diagnosis not present

## 2018-02-03 DIAGNOSIS — N183 Chronic kidney disease, stage 3 unspecified: Secondary | ICD-10-CM

## 2018-02-03 MED ORDER — FUROSEMIDE 40 MG PO TABS: 40.0000 mg | ORAL_TABLET | ORAL | 3 refills | Status: DC | PRN

## 2018-02-03 NOTE — Patient Instructions (Addendum)
Medication Instructions:  START Lasix 40 mg as needed for weight gain of 3 lbs in a day or 5 lbs in a week. If you need a refill on your cardiac medications before your next appointment, please call your pharmacy.   Lab work: BMET, CBC If you have labs (blood work) drawn today and your tests are completely normal, you will receive your results only by: Marland Kitchen MyChart Message (if you have MyChart) OR . A paper copy in the mail If you have any lab test that is abnormal or we need to change your treatment, we will call you to review the results.  Testing/Procedures: NONE  Follow-Up: At [facility], you and your health needs are our priority.  As part of our continuing mission to provide you with exceptional heart care, we have created designated Provider Care Teams.  These Care Teams include your primary Cardiologist (physician) and Advanced Practice Providers (APPs -  Physician Assistants and Nurse Practitioners) who all work together to provide you with the care you need, when you need it. You will need a follow up appointment in 3 months.  Please call our office 2 months in advance to schedule this appointment.  You may see Kirk Ruths, MD or one of the following Advanced Practice Providers on your designated Care Team:   Kerin Ransom, PA-C Roby Lofts, [place] . Sande Rives, PA-C  Any Other Special Instructions Will Be Listed Below (If Applicable). Continue daily weights. Do not consume more than 500 mg of Sodium per day.  DO NOT DRINK more than  1 1/2-2 quarts of liquids per day.

## 2018-02-04 ENCOUNTER — Ambulatory Visit: Payer: Medicare Other | Admitting: Family Medicine

## 2018-02-04 LAB — CBC
HEMOGLOBIN: 9.8 g/dL — AB (ref 11.1–15.9)
Hematocrit: 30.9 % — ABNORMAL LOW (ref 34.0–46.6)
MCH: 28.1 pg (ref 26.6–33.0)
MCHC: 31.7 g/dL (ref 31.5–35.7)
MCV: 89 fL (ref 79–97)
Platelets: 197 10*3/uL (ref 150–450)
RBC: 3.49 x10E6/uL — ABNORMAL LOW (ref 3.77–5.28)
RDW: 21.1 % — ABNORMAL HIGH (ref 12.3–15.4)
WBC: 3.5 10*3/uL (ref 3.4–10.8)

## 2018-02-04 LAB — BASIC METABOLIC PANEL
BUN / CREAT RATIO: 18 (ref 12–28)
BUN: 32 mg/dL — ABNORMAL HIGH (ref 8–27)
CALCIUM: 9.4 mg/dL (ref 8.7–10.3)
CHLORIDE: 104 mmol/L (ref 96–106)
CO2: 19 mmol/L — AB (ref 20–29)
CREATININE: 1.76 mg/dL — AB (ref 0.57–1.00)
GFR calc Af Amer: 31 mL/min/{1.73_m2} — ABNORMAL LOW (ref >59)
GFR calc non Af Amer: 27 mL/min/{1.73_m2} — ABNORMAL LOW (ref >59)
GLUCOSE: 113 mg/dL — AB (ref 65–99)
Potassium: 4 mmol/L (ref 3.5–5.2)
Sodium: 137 mmol/L (ref 134–144)

## 2018-02-24 ENCOUNTER — Telehealth: Payer: Self-pay | Admitting: Pharmacist

## 2018-02-24 NOTE — Telephone Encounter (Signed)
Called Ms. Jennifer Hinton to follow-up how her transition from warfarin to Eliquis has been. She confirmed she has stopped taking warfarin and stated the correct dosing regimen for Eliquis (2.5mg  PO BID). She reports complaince to Eliquis and denies any s/sx of bleeding or clotting. She acknowledges the importance of adherence to her medication.   Call completed with Juanell Fairly, PharmD PGY1 Acute Care Pharmacy Resident

## 2018-02-25 ENCOUNTER — Other Ambulatory Visit: Payer: Self-pay | Admitting: Family Medicine

## 2018-02-25 DIAGNOSIS — I4821 Permanent atrial fibrillation: Secondary | ICD-10-CM

## 2018-03-13 ENCOUNTER — Ambulatory Visit: Payer: Medicare Other | Admitting: Family Medicine

## 2018-03-18 ENCOUNTER — Other Ambulatory Visit: Payer: Self-pay | Admitting: Physician Assistant

## 2018-03-20 ENCOUNTER — Other Ambulatory Visit: Payer: Self-pay | Admitting: Cardiology

## 2018-04-01 ENCOUNTER — Telehealth: Payer: Self-pay | Admitting: *Deleted

## 2018-04-01 NOTE — Telephone Encounter (Signed)
Pts daughter in law called and would like pt to get some help at home.  Strictly for bathing, as she is not concerned about help around the house.  Will forward to MD to place referral for [facility] if appropriate. Artasia Thang, [place] Spotted, CMA

## 2018-04-02 ENCOUNTER — Other Ambulatory Visit: Payer: Self-pay | Admitting: Family Medicine

## 2018-04-02 DIAGNOSIS — I5032 Chronic diastolic (congestive) heart failure: Secondary | ICD-10-CM

## 2018-04-02 DIAGNOSIS — W19XXXA Unspecified fall, initial encounter: Secondary | ICD-10-CM

## 2018-04-02 NOTE — Telephone Encounter (Signed)
Referral place for [facility] with aide to help with aide.   Thank you  [name], MD [place], PGY-3

## 2018-04-23 ENCOUNTER — Other Ambulatory Visit: Payer: Self-pay

## 2018-04-23 ENCOUNTER — Encounter (HOSPITAL_COMMUNITY): Payer: Self-pay

## 2018-04-23 ENCOUNTER — Inpatient Hospital Stay (HOSPITAL_COMMUNITY)
Admission: EM | Admit: 2018-04-23 | Discharge: 2018-05-08 | DRG: 377 | Disposition: A | Discharge status: Skilled Nursing Facility | Payer: Medicare Other | Attending: Family Medicine | Admitting: Family Medicine

## 2018-04-23 ENCOUNTER — Ambulatory Visit (INDEPENDENT_AMBULATORY_CARE_PROVIDER_SITE_OTHER): Payer: Medicare Other | Admitting: Family Medicine

## 2018-04-23 ENCOUNTER — Encounter: Payer: Self-pay | Admitting: Family Medicine

## 2018-04-23 ENCOUNTER — Emergency Department (HOSPITAL_COMMUNITY): Payer: Medicare Other

## 2018-04-23 ENCOUNTER — Encounter (HOSPITAL_COMMUNITY): Payer: Self-pay | Admitting: *Deleted

## 2018-04-23 DIAGNOSIS — E46 Unspecified protein-calorie malnutrition: Secondary | ICD-10-CM | POA: Diagnosis not present

## 2018-04-23 DIAGNOSIS — C9000 Multiple myeloma not having achieved remission: Secondary | ICD-10-CM | POA: Diagnosis not present

## 2018-04-23 DIAGNOSIS — J9811 Atelectasis: Secondary | ICD-10-CM | POA: Diagnosis not present

## 2018-04-23 DIAGNOSIS — K922 Gastrointestinal hemorrhage, unspecified: Secondary | ICD-10-CM | POA: Diagnosis not present

## 2018-04-23 DIAGNOSIS — D649 Anemia, unspecified: Secondary | ICD-10-CM | POA: Diagnosis not present

## 2018-04-23 DIAGNOSIS — N184 Chronic kidney disease, stage 4 (severe): Secondary | ICD-10-CM | POA: Diagnosis not present

## 2018-04-23 DIAGNOSIS — I776 Arteritis, unspecified: Secondary | ICD-10-CM | POA: Diagnosis not present

## 2018-04-23 DIAGNOSIS — N19 Unspecified kidney failure: Secondary | ICD-10-CM | POA: Diagnosis not present

## 2018-04-23 DIAGNOSIS — E875 Hyperkalemia: Secondary | ICD-10-CM | POA: Diagnosis not present

## 2018-04-23 DIAGNOSIS — I4821 Permanent atrial fibrillation: Secondary | ICD-10-CM | POA: Diagnosis not present

## 2018-04-23 DIAGNOSIS — E43 Unspecified severe protein-calorie malnutrition: Secondary | ICD-10-CM | POA: Diagnosis not present

## 2018-04-23 DIAGNOSIS — M109 Gout, unspecified: Secondary | ICD-10-CM | POA: Diagnosis not present

## 2018-04-23 DIAGNOSIS — Z7901 Long term (current) use of anticoagulants: Secondary | ICD-10-CM

## 2018-04-23 DIAGNOSIS — I5033 Acute on chronic diastolic (congestive) heart failure: Secondary | ICD-10-CM | POA: Diagnosis not present

## 2018-04-23 DIAGNOSIS — I120 Hypertensive chronic kidney disease with stage 5 chronic kidney disease or end stage renal disease: Secondary | ICD-10-CM | POA: Diagnosis not present

## 2018-04-23 DIAGNOSIS — D509 Iron deficiency anemia, unspecified: Secondary | ICD-10-CM | POA: Diagnosis not present

## 2018-04-23 DIAGNOSIS — R6881 Early satiety: Secondary | ICD-10-CM | POA: Diagnosis not present

## 2018-04-23 DIAGNOSIS — I5032 Chronic diastolic (congestive) heart failure: Secondary | ICD-10-CM | POA: Diagnosis not present

## 2018-04-23 DIAGNOSIS — Z515 Encounter for palliative care: Secondary | ICD-10-CM

## 2018-04-23 DIAGNOSIS — R509 Fever, unspecified: Secondary | ICD-10-CM

## 2018-04-23 DIAGNOSIS — E876 Hypokalemia: Secondary | ICD-10-CM | POA: Diagnosis not present

## 2018-04-23 DIAGNOSIS — J90 Pleural effusion, not elsewhere classified: Secondary | ICD-10-CM | POA: Diagnosis not present

## 2018-04-23 DIAGNOSIS — Z6821 Body mass index (BMI) 21.0-21.9, adult: Secondary | ICD-10-CM | POA: Diagnosis not present

## 2018-04-23 DIAGNOSIS — K921 Melena: Secondary | ICD-10-CM | POA: Diagnosis not present

## 2018-04-23 DIAGNOSIS — D62 Acute posthemorrhagic anemia: Secondary | ICD-10-CM | POA: Diagnosis not present

## 2018-04-23 DIAGNOSIS — I272 Pulmonary hypertension, unspecified: Secondary | ICD-10-CM | POA: Diagnosis not present

## 2018-04-23 DIAGNOSIS — N186 End stage renal disease: Secondary | ICD-10-CM | POA: Diagnosis not present

## 2018-04-23 DIAGNOSIS — Z7189 Other specified counseling: Secondary | ICD-10-CM | POA: Diagnosis not present

## 2018-04-23 DIAGNOSIS — I10 Essential (primary) hypertension: Secondary | ICD-10-CM | POA: Diagnosis not present

## 2018-04-23 DIAGNOSIS — R188 Other ascites: Secondary | ICD-10-CM | POA: Diagnosis not present

## 2018-04-23 DIAGNOSIS — R3129 Other microscopic hematuria: Secondary | ICD-10-CM | POA: Diagnosis not present

## 2018-04-23 DIAGNOSIS — R195 Other fecal abnormalities: Secondary | ICD-10-CM | POA: Diagnosis not present

## 2018-04-23 DIAGNOSIS — Z79899 Other long term (current) drug therapy: Secondary | ICD-10-CM

## 2018-04-23 DIAGNOSIS — R5383 Other fatigue: Secondary | ICD-10-CM | POA: Diagnosis present

## 2018-04-23 DIAGNOSIS — K449 Diaphragmatic hernia without obstruction or gangrene: Secondary | ICD-10-CM | POA: Diagnosis present

## 2018-04-23 DIAGNOSIS — Z8249 Family history of ischemic heart disease and other diseases of the circulatory system: Secondary | ICD-10-CM

## 2018-04-23 DIAGNOSIS — K5730 Diverticulosis of large intestine without perforation or abscess without bleeding: Secondary | ICD-10-CM | POA: Diagnosis not present

## 2018-04-23 DIAGNOSIS — N189 Chronic kidney disease, unspecified: Secondary | ICD-10-CM | POA: Diagnosis not present

## 2018-04-23 DIAGNOSIS — N179 Acute kidney failure, unspecified: Secondary | ICD-10-CM | POA: Diagnosis not present

## 2018-04-23 DIAGNOSIS — I2721 Secondary pulmonary arterial hypertension: Secondary | ICD-10-CM | POA: Diagnosis present

## 2018-04-23 DIAGNOSIS — B9620 Unspecified Escherichia coli [E. coli] as the cause of diseases classified elsewhere: Secondary | ICD-10-CM | POA: Diagnosis present

## 2018-04-23 DIAGNOSIS — I071 Rheumatic tricuspid insufficiency: Secondary | ICD-10-CM | POA: Diagnosis present

## 2018-04-23 DIAGNOSIS — N185 Chronic kidney disease, stage 5: Secondary | ICD-10-CM | POA: Diagnosis not present

## 2018-04-23 DIAGNOSIS — I4891 Unspecified atrial fibrillation: Secondary | ICD-10-CM | POA: Diagnosis not present

## 2018-04-23 DIAGNOSIS — K3189 Other diseases of stomach and duodenum: Secondary | ICD-10-CM | POA: Diagnosis not present

## 2018-04-23 DIAGNOSIS — R634 Abnormal weight loss: Secondary | ICD-10-CM

## 2018-04-23 DIAGNOSIS — R54 Age-related physical debility: Secondary | ICD-10-CM | POA: Diagnosis not present

## 2018-04-23 DIAGNOSIS — E785 Hyperlipidemia, unspecified: Secondary | ICD-10-CM | POA: Diagnosis not present

## 2018-04-23 DIAGNOSIS — Q2733 Arteriovenous malformation of digestive system vessel: Secondary | ICD-10-CM | POA: Diagnosis not present

## 2018-04-23 DIAGNOSIS — R531 Weakness: Secondary | ICD-10-CM | POA: Diagnosis not present

## 2018-04-23 DIAGNOSIS — I509 Heart failure, unspecified: Secondary | ICD-10-CM | POA: Diagnosis not present

## 2018-04-23 DIAGNOSIS — K31811 Angiodysplasia of stomach and duodenum with bleeding: Principal | ICD-10-CM | POA: Diagnosis present

## 2018-04-23 DIAGNOSIS — M6281 Muscle weakness (generalized): Secondary | ICD-10-CM | POA: Diagnosis not present

## 2018-04-23 DIAGNOSIS — D631 Anemia in chronic kidney disease: Secondary | ICD-10-CM | POA: Diagnosis not present

## 2018-04-23 DIAGNOSIS — E8770 Fluid overload, unspecified: Secondary | ICD-10-CM

## 2018-04-23 DIAGNOSIS — Z7401 Bed confinement status: Secondary | ICD-10-CM | POA: Diagnosis not present

## 2018-04-23 DIAGNOSIS — R0902 Hypoxemia: Secondary | ICD-10-CM | POA: Diagnosis not present

## 2018-04-23 DIAGNOSIS — R0602 Shortness of breath: Secondary | ICD-10-CM | POA: Diagnosis not present

## 2018-04-23 DIAGNOSIS — N390 Urinary tract infection, site not specified: Secondary | ICD-10-CM | POA: Diagnosis not present

## 2018-04-23 DIAGNOSIS — I110 Hypertensive heart disease with heart failure: Secondary | ICD-10-CM | POA: Diagnosis not present

## 2018-04-23 DIAGNOSIS — M2550 Pain in unspecified joint: Secondary | ICD-10-CM | POA: Diagnosis not present

## 2018-04-23 DIAGNOSIS — I132 Hypertensive heart and chronic kidney disease with heart failure and with stage 5 chronic kidney disease, or end stage renal disease: Secondary | ICD-10-CM | POA: Diagnosis present

## 2018-04-23 DIAGNOSIS — R58 Hemorrhage, not elsewhere classified: Secondary | ICD-10-CM | POA: Diagnosis not present

## 2018-04-23 DIAGNOSIS — Z66 Do not resuscitate: Secondary | ICD-10-CM | POA: Diagnosis not present

## 2018-04-23 DIAGNOSIS — Z8349 Family history of other endocrine, nutritional and metabolic diseases: Secondary | ICD-10-CM

## 2018-04-23 DIAGNOSIS — D500 Iron deficiency anemia secondary to blood loss (chronic): Secondary | ICD-10-CM | POA: Diagnosis not present

## 2018-04-23 DIAGNOSIS — M069 Rheumatoid arthritis, unspecified: Secondary | ICD-10-CM | POA: Diagnosis present

## 2018-04-23 LAB — COMPREHENSIVE METABOLIC PANEL
ALBUMIN: 2.5 g/dL — AB (ref 3.5–5.0)
ALT: 8 U/L (ref 0–44)
AST: 28 U/L (ref 15–41)
Alkaline Phosphatase: 39 U/L (ref 38–126)
Anion gap: 10 (ref 5–15)
BUN: 82 mg/dL — ABNORMAL HIGH (ref 8–23)
CHLORIDE: 106 mmol/L (ref 98–111)
CO2: 17 mmol/L — ABNORMAL LOW (ref 22–32)
CREATININE: 3.66 mg/dL — AB (ref 0.44–1.00)
Calcium: 8.4 mg/dL — ABNORMAL LOW (ref 8.9–10.3)
GFR calc Af Amer: 13 mL/min — ABNORMAL LOW (ref >60)
GFR calc non Af Amer: 11 mL/min — ABNORMAL LOW (ref >60)
Glucose, Bld: 139 mg/dL — ABNORMAL HIGH (ref 70–99)
Potassium: 3 mmol/L — ABNORMAL LOW (ref 3.5–5.1)
SODIUM: 133 mmol/L — AB (ref 135–145)
Total Bilirubin: 0.6 mg/dL (ref 0.3–1.2)
Total Protein: 7.5 g/dL (ref 6.5–8.1)

## 2018-04-23 LAB — URINALYSIS, ROUTINE W REFLEX MICROSCOPIC
Bilirubin Urine: NEGATIVE
Glucose, UA: NEGATIVE mg/dL
Ketones, ur: NEGATIVE mg/dL
Leukocytes,Ua: NEGATIVE
Nitrite: NEGATIVE
Protein, ur: 30 mg/dL — AB
Specific Gravity, Urine: 1.011 (ref 1.005–1.030)
pH: 6 (ref 5.0–8.0)

## 2018-04-23 LAB — CBC WITH DIFFERENTIAL/PLATELET
Abs Immature Granulocytes: 0 10*3/uL (ref 0.00–0.07)
Basophils Absolute: 0 10*3/uL (ref 0.0–0.1)
Basophils Relative: 0 %
Eosinophils Absolute: 0 10*3/uL (ref 0.0–0.5)
Eosinophils Relative: 0 %
HCT: 16.1 % — ABNORMAL LOW (ref 36.0–46.0)
Hemoglobin: 5 g/dL — CL (ref 12.0–15.0)
LYMPHS PCT: 12 %
Lymphs Abs: 0.5 10*3/uL — ABNORMAL LOW (ref 0.7–4.0)
MCH: 30.3 pg (ref 26.0–34.0)
MCHC: 31.1 g/dL (ref 30.0–36.0)
MCV: 97.6 fL (ref 80.0–100.0)
Monocytes Absolute: 0 10*3/uL — ABNORMAL LOW (ref 0.1–1.0)
Monocytes Relative: 0 %
NRBC: 0 % (ref 0.0–0.2)
Neutro Abs: 3.4 10*3/uL (ref 1.7–7.7)
Neutrophils Relative %: 88 %
Platelets: 185 10*3/uL (ref 150–400)
RBC: 1.65 MIL/uL — ABNORMAL LOW (ref 3.87–5.11)
RDW: 16.9 % — AB (ref 11.5–15.5)
WBC: 3.9 10*3/uL — ABNORMAL LOW (ref 4.0–10.5)
nRBC: 0 /100 WBC

## 2018-04-23 LAB — POC OCCULT BLOOD, ED: Fecal Occult Bld: POSITIVE — AB

## 2018-04-23 LAB — RETICULOCYTES
Immature Retic Fract: 27.3 % — ABNORMAL HIGH (ref 2.3–15.9)
RBC.: 1.48 MIL/uL — ABNORMAL LOW (ref 3.87–5.11)
Retic Count, Absolute: 67.5 10*3/uL (ref 19.0–186.0)
Retic Ct Pct: 4.6 % — ABNORMAL HIGH (ref 0.4–3.1)

## 2018-04-23 LAB — IRON AND TIBC
Iron: 19 ug/dL — ABNORMAL LOW (ref 28–170)
Saturation Ratios: 8 % — ABNORMAL LOW (ref 10.4–31.8)
TIBC: 228 ug/dL — ABNORMAL LOW (ref 250–450)
UIBC: 209 ug/dL

## 2018-04-23 LAB — PROTIME-INR
INR: 1.5 — ABNORMAL HIGH (ref 0.8–1.2)
Prothrombin Time: 17.7 seconds — ABNORMAL HIGH (ref 11.4–15.2)

## 2018-04-23 LAB — FOLATE: Folate: 8.8 ng/mL (ref >5.9)

## 2018-04-23 LAB — VITAMIN B12: Vitamin B-12: 287 pg/mL (ref 180–914)

## 2018-04-23 LAB — POCT HEMOGLOBIN: Hemoglobin: 4.5 g/dL — AB (ref 11–14.6)

## 2018-04-23 LAB — PREPARE RBC (CROSSMATCH)

## 2018-04-23 LAB — BRAIN NATRIURETIC PEPTIDE: B Natriuretic Peptide: 983 pg/mL — ABNORMAL HIGH (ref 0.0–100.0)

## 2018-04-23 LAB — ABO/RH: ABO/RH(D): A POS

## 2018-04-23 LAB — APTT: aPTT: 42 seconds — ABNORMAL HIGH (ref 24–36)

## 2018-04-23 LAB — FERRITIN: Ferritin: 51 ng/mL (ref 11–307)

## 2018-04-23 MED ORDER — AMLODIPINE BESYLATE 10 MG PO TABS
10.0000 mg | ORAL_TABLET | Freq: Every day | ORAL | Status: DC
  Administered 2018-04-24 – 2018-05-08 (×13): 10 mg via ORAL
  Filled 2018-04-23 (×14): qty 1

## 2018-04-23 MED ORDER — PANTOPRAZOLE SODIUM 40 MG IV SOLR
40.0000 mg | Freq: Once | INTRAVENOUS | Status: AC
  Administered 2018-04-23: 40 mg via INTRAVENOUS
  Filled 2018-04-23: qty 40

## 2018-04-23 MED ORDER — ENSURE ENLIVE PO LIQD
237.0000 mL | Freq: Two times a day (BID) | ORAL | Status: DC
  Administered 2018-04-24 – 2018-04-25 (×2): 237 mL via ORAL

## 2018-04-23 MED ORDER — POTASSIUM CHLORIDE 10 MEQ/100ML IV SOLN
10.0000 meq | INTRAVENOUS | Status: AC
  Administered 2018-04-23 – 2018-04-24 (×5): 10 meq via INTRAVENOUS
  Filled 2018-04-23 (×4): qty 100

## 2018-04-23 MED ORDER — ACETAMINOPHEN 650 MG RE SUPP: 650.0000 mg | Freq: Four times a day (QID) | RECTAL | Status: DC | PRN

## 2018-04-23 MED ORDER — PANTOPRAZOLE SODIUM 40 MG IV SOLR
40.0000 mg | Freq: Two times a day (BID) | INTRAVENOUS | Status: DC
  Administered 2018-04-23 – 2018-04-29 (×12): 40 mg via INTRAVENOUS
  Filled 2018-04-23 (×13): qty 40

## 2018-04-23 MED ORDER — SODIUM CHLORIDE 0.9 % IV SOLN: INTRAVENOUS | Status: DC

## 2018-04-23 MED ORDER — HYDRALAZINE HCL 50 MG PO TABS
100.0000 mg | ORAL_TABLET | Freq: Three times a day (TID) | ORAL | Status: DC
  Administered 2018-04-23 – 2018-04-30 (×20): 100 mg via ORAL
  Filled 2018-04-23 (×23): qty 2

## 2018-04-23 MED ORDER — CARVEDILOL 25 MG PO TABS
25.0000 mg | ORAL_TABLET | Freq: Two times a day (BID) | ORAL | Status: DC
  Administered 2018-04-24 – 2018-05-02 (×15): 25 mg via ORAL
  Filled 2018-04-23 (×16): qty 1

## 2018-04-23 MED ORDER — SODIUM CHLORIDE 0.9% IV SOLUTION
Freq: Once | INTRAVENOUS | Status: AC
  Administered 2018-04-23: 20:00:00 via INTRAVENOUS

## 2018-04-23 MED ORDER — ACETAMINOPHEN 325 MG PO TABS
650.0000 mg | ORAL_TABLET | Freq: Four times a day (QID) | ORAL | Status: DC | PRN
  Administered 2018-05-02 – 2018-05-07 (×8): 650 mg via ORAL
  Filled 2018-04-23 (×9): qty 2

## 2018-04-23 NOTE — Assessment & Plan Note (Addendum)
Patient presented today to clinic complaining of fatigue weakness for the past few weeks.  Patient denies any shortness of breath, dizziness, fall or melena.  Point-of-care hemoglobin 4.5.  Patient is currently on Eliquis.  She was transitioned from warfarin in November.  Given profound anemia and history of recent transfusions in the setting of iron deficiency anemia, we will send patient to the ED for further management.  Patient will likely need admission.  Family medicine teaching service has been notified.

## 2018-04-23 NOTE — ED Notes (Signed)
ED TO INPATIENT HANDOFF REPORT  ED Nurse Name and Phone #: Suezanne Jacquet [phone]  S Name/Age/Gender Jennifer Hinton 83 y.o. female Room/Bed: 026C/026C  Code Status   Code Status: Prior  Home/SNF/Other Home Patient oriented to: self, place, time and situation Is this baseline? Yes   Triage Complete: Triage complete  Chief Complaint Low Hemoglobin  Triage Note Pt here from MD office after finger stick showed hgb of 4.5.    Allergies No Known Allergies  Level of Care/Admitting Diagnosis ED Disposition    ED Disposition Condition [facility] Area: [place] [[id number]]  Level of Care: Medical Telemetry [104]  Diagnosis: GI bleed [[id number]]  Admitting Physician: Rory Percy [[id number]]  Attending Physician: Zenia Resides [[id number]]  Estimated length of stay: past midnight tomorrow  Certification:: I certify this patient will need inpatient services for at least 2 midnights  PT Class (Do Not Modify): Inpatient [101]  PT Acc Code (Do Not Modify): Private [1]       B Medical/Surgery History Past Medical History:  Diagnosis Date  . Anemia due to blood loss, acute 11/2017  . Atrial fibrillation, permanent   . Chronic diastolic CHF (congestive heart failure) ([place])   . Hyperlipidemia   . Hypertension   . Pulmonary hypertension ([place])   . RA (rheumatoid arthritis) ([place])    Past Surgical History:  Procedure Laterality Date  . COLONOSCOPY WITH PROPOFOL N/A 12/16/2017   Procedure: COLONOSCOPY WITH PROPOFOL;  Surgeon: Wonda Horner, MD;  [facility];  Service: Endoscopy;  Laterality: N/A;  . ESOPHAGOGASTRODUODENOSCOPY (EGD) WITH PROPOFOL N/A 12/16/2017   Procedure: ESOPHAGOGASTRODUODENOSCOPY (EGD) WITH PROPOFOL;  Surgeon: Wonda Horner, MD;  [facility];  Service: Endoscopy;  Laterality: N/A;  . NO PAST SURGERIES    . POLYPECTOMY  12/16/2017   Procedure: POLYPECTOMY;  Surgeon: Wonda Horner, MD;  Location: [facility];   Service: Endoscopy;;     A IV Location/Drains/Wounds Patient Lines/Drains/Airways Status   Active Line/Drains/Airways    Name:   Placement date:   Placement time:   Site:   Days:   Peripheral IV 04/23/18 Right Antecubital   04/23/18    1632    Antecubital   less than 1          Intake/Output Last 24 hours No intake or output data in the 24 hours ending 04/23/18 1952  Labs/Imaging Results for orders placed or performed during the hospital encounter of 04/23/18 (from the past 48 hour(s))  Comprehensive metabolic panel     Status: Abnormal   Collection Time: 04/23/18  4:03 PM  Result Value Ref Range   Sodium 133 (L) 135 - 145 mmol/L   Potassium 3.0 (L) 3.5 - 5.1 mmol/L   Chloride 106 98 - 111 mmol/L   CO2 17 (L) 22 - 32 mmol/L   Glucose, Bld 139 (H) 70 - 99 mg/dL   BUN 82 (H) 8 - 23 mg/dL   Creatinine, Ser 3.66 (H) 0.44 - 1.00 mg/dL   Calcium 8.4 (L) 8.9 - 10.3 mg/dL   Total Protein 7.5 6.5 - 8.1 g/dL   Albumin 2.5 (L) 3.5 - 5.0 g/dL   AST 28 15 - 41 U/L   ALT 8 0 - 44 U/L   Alkaline Phosphatase 39 38 - 126 U/L   Total Bilirubin 0.6 0.3 - 1.2 mg/dL   GFR calc non Af Amer 11 (L) >60 mL/min   GFR calc Af Amer 13 (L) >60 mL/min  Anion gap 10 5 - 15    Comment: Performed at [facility] [address] [place], [place] [address]  CBC with Differential     Status: Abnormal   Collection Time: 04/23/18  4:03 PM  Result Value Ref Range   WBC 3.9 (L) 4.0 - 10.5 K/uL   RBC 1.65 (L) 3.87 - 5.11 MIL/uL   Hemoglobin 5.0 (LL) 12.0 - 15.0 g/dL    Comment: REPEATED TO VERIFY THIS CRITICAL RESULT HAS VERIFIED AND BEEN CALLED TO S.BISHOP RN BY KATHERINE MCCORMICK ON 03 05 2020 AT [id number], AND HAS BEEN READ BACK.     HCT 16.1 (L) 36.0 - 46.0 %   MCV 97.6 80.0 - 100.0 fL   MCH 30.3 26.0 - 34.0 pg   MCHC 31.1 30.0 - 36.0 g/dL   RDW 16.9 (H) 11.5 - 15.5 %   Platelets 185 150 - 400 K/uL   nRBC 0.0 0.0 - 0.2 %   Neutrophils Relative % 88 %   Neutro Abs 3.4 1.7 - 7.7 K/uL    Lymphocytes Relative 12 %   Lymphs Abs 0.5 (L) 0.7 - 4.0 K/uL   Monocytes Relative 0 %   Monocytes Absolute 0.0 (L) 0.1 - 1.0 K/uL   Eosinophils Relative 0 %   Eosinophils Absolute 0.0 0.0 - 0.5 K/uL   Basophils Relative 0 %   Basophils Absolute 0.0 0.0 - 0.1 K/uL   WBC Morphology See Note     Comment: >10% reactive, Benign Lymphocytes.   nRBC 0 0 /100 WBC   Abs Immature Granulocytes 0.00 0.00 - 0.07 K/uL   Tear Drop Cells PRESENT    Polychromasia PRESENT     Comment: Performed at [facility], [place] [address], [place], [place] [address]  Protime-INR     Status: Abnormal   Collection Time: 04/23/18  4:03 PM  Result Value Ref Range   Prothrombin Time 17.7 (H) 11.4 - 15.2 seconds   INR 1.5 (H) 0.8 - 1.2    Comment: (NOTE) INR goal varies based on device and disease states. Performed at [facility], [place] [address], [place], [place] [address]   APTT     Status: Abnormal   Collection Time: 04/23/18  4:03 PM  Result Value Ref Range   aPTT 42 (H) 24 - 36 seconds    Comment:        IF BASELINE aPTT IS ELEVATED, SUGGEST PATIENT RISK ASSESSMENT BE USED TO DETERMINE APPROPRIATE ANTICOAGULANT THERAPY. Performed at [facility], [place] [address] [place], [place] [address]   Type and screen [place]     Status: None (Preliminary result)   Collection Time: 04/23/18  4:15 PM  Result Value Ref Range   ABO/RH(D) A POS    Antibody Screen NEG    Sample Expiration 04/26/2018    Unit Number [id number]    Blood Component Type RED CELLS,LR    Unit division 00    Status of Unit ALLOCATED    Transfusion Status OK TO TRANSFUSE    Crossmatch Result Compatible    Unit Number [id number]    Blood Component Type RED CELLS,LR    Unit division 00    Status of Unit ISSUED    Transfusion Status OK TO TRANSFUSE    Crossmatch Result      Compatible Performed at [facility], [place] [address], [place], [place] [address]   ABO/Rh     Status: None  (Preliminary  result)   Collection Time: 04/23/18  4:15 PM  Result Value Ref Range   ABO/RH(D)      A POS Performed at [facility], [place] [address], [place], [place] [address]   Brain natriuretic peptide     Status: Abnormal   Collection Time: 04/23/18  4:25 PM  Result Value Ref Range   B Natriuretic Peptide 983.0 (H) 0.0 - 100.0 pg/mL    Comment: Performed at [facility] [address], [place], [place] [address]  POC occult blood, ED     Status: Abnormal   Collection Time: 04/23/18  4:29 PM  Result Value Ref Range   Fecal Occult Bld POSITIVE (A) NEGATIVE  Vitamin B12     Status: None   Collection Time: 04/23/18  5:05 PM  Result Value Ref Range   Vitamin B-12 287 180 - 914 pg/mL    Comment: (NOTE) This assay is not validated for testing neonatal or myeloproliferative syndrome specimens for Vitamin B12 levels. Performed at [facility], [place] [address] [place], [place] [address]   Folate     Status: None   Collection Time: 04/23/18  5:05 PM  Result Value Ref Range   Folate 8.8 >5.9 ng/mL    Comment: Performed at [facility], [place] [address], [place], [place] [address]  Iron and TIBC     Status: Abnormal   Collection Time: 04/23/18  5:05 PM  Result Value Ref Range   Iron 19 (L) 28 - 170 ug/dL   TIBC 228 (L) 250 - 450 ug/dL   Saturation Ratios 8 (L) 10.4 - 31.8 %   UIBC 209 ug/dL    Comment: Performed at [facility] [address] [place], [place] [address]  Ferritin     Status: None   Collection Time: 04/23/18  5:05 PM  Result Value Ref Range   Ferritin 51 11 - 307 ng/mL    Comment: Performed at [facility], [place] [address] [place], [place] [address]  Reticulocytes     Status: Abnormal   Collection Time: 04/23/18  5:05 PM  Result Value Ref Range   Retic Ct Pct 4.6 (H) 0.4 - 3.1 %   RBC. 1.48 (L) 3.87 - 5.11 MIL/uL   Retic Count, Absolute 67.5 19.0 - 186.0 K/uL   Immature Retic Fract 27.3 (H) 2.3 - 15.9 %    Comment: Performed  at [facility] [address], [place], [place] [address]  Prepare RBC     Status: None   Collection Time: 04/23/18  5:17 PM  Result Value Ref Range   Order Confirmation      ORDER PROCESSED BY BLOOD BANK Performed at [facility], [place] [address] [place], [place] [address]    Dg Chest 1 View  Result Date: 04/23/2018 CLINICAL DATA:  Initial evaluation for acute shortness of breath. EXAM: CHEST  1 VIEW COMPARISON:  Prior radiograph from 12/11/2017. FINDINGS: Advanced cardiomegaly, similar to previous. Mediastinal silhouette within normal limits. Aortic atherosclerosis. Lungs normally inflated. Prominent perihilar vascular congestion with mild diffuse interstitial congestion. Probable left pleural effusion. No focal infiltrates. No pneumothorax. No acute osseous finding. IMPRESSION: 1. Cardiomegaly with mild diffuse pulmonary interstitial edema and small left pleural effusion, suggesting CHF. 2. Aortic atherosclerosis. Electronically Signed   By: Jeannine Boga M.D.   On: 04/23/2018 16:59    Pending Labs [employer] (From admission, onward)    Start  Ordered   Signed and Held  Nutritional therapist morning,   R     Signed and Held   Signed and Held  CBC  Tomorrow morning,   R     Signed and Held          Vitals/Pain Today's Vitals   04/23/18 1859 04/23/18 1900 04/23/18 1900 04/23/18 1941  BP:  135/67  136/68  Pulse:  74  81  Resp:  18  20  Temp: 98.1 F (36.7 C)   98.1 F (36.7 C)  TempSrc: Oral   Oral  SpO2:  99%  98%  PainSc:   0-No pain     Isolation Precautions No active isolations  Medications Medications  pantoprazole (PROTONIX) injection 40 mg (has no administration in time range)  0.9 %  sodium chloride infusion (Manually program via Guardrails IV Fluids) ( Intravenous New Bag/Given 04/23/18 1941)  pantoprazole (PROTONIX) injection 40 mg (40 mg Intravenous Given 04/23/18 1938)    Mobility walks with device Low fall risk    Focused Assessments Renal Assessment Handoff:  BNP: 983 Creatinine: 3.66 BUN : 82  GI Assessments:  Fecal Occult Positive        R Recommendations: See Admitting Provider Note  Report given to:   Additional Notes: N/A

## 2018-04-23 NOTE — H&P (Signed)
[facility] Admission History and Physical Service Pager: [phone]  Patient name: Jennifer Hinton Medical record number: [id number] Date of birth: Nov 12, 1935 Age: 83 y.o. Gender: female  Primary Care Provider: [name], MD Consultants: GI Code Status: Full (confirmed on admission)  Chief Complaint: weakness  Assessment and Plan: Jennifer Hinton is a 83 y.o. female presenting with symptomatic anemia 2/2 GI bleed. PMH is significant for HLD, gout, HTN, afib, diastolic CHF, pHTN, CKDIV.  Symptomatic Anemia 2/2 GI bleed Hb 4.5 in clinic, 5.0 in the ED without vital sign instability. Not yet started transfusion at the time of exam. With h/o of dark, tarry stools and +FOBT in ED, most likely source GI. Iron panel indicative of IDA. Previously admitted 11/2017 for similar, colonoscopy at that time with tubular adenoma polyp, negative for malignancy, as well as diverticulosis. EGD at that time negative for active bleed or ulcers. No known FH of colon cancer. No significant weight change noted in the past month per chart review, however is -26lbs from 2018. No known vaginal or urinary bleeding, will obtain UA. Most likely chronic ongoing bleed given no vital sign instability. Dark stools concerning for upper GI bleed. Denies new medication changes. Unclear on NSAID use. GI consulted in ED, will follow recs. - admit to med telemetry, attending Dr. Nori Riis - transfusing 2u pRBC, will f/u post H&H - IV Protonix BID - NPO - GI consulted in ED, appreciate recs - am CBC - SCDs - UA  AKI on CKDIV Cr 3.66 on admission (baseline ~1.5-1.7), likely prerenal given acute bleed. Per chart review, was referred to Nephrology in 12/2017. Patient doesn't remember establishing with Nephrology yet. - currently transfusing 2u pRBC - am BMP - holding home lasix  Hypokalemia K 3.0 on admission, will replete. - am BMP, Mag  HTN Normotensive on admission. On norvasc, coreg, lasix  prn, hydralazine at home. - continue home meds, hold lasix  Diastolic CHF, pulmonary HTN Follows with Dr. Stanford Breed. Last ECHO 05/2017 LVEF 60-65% with no wall motion abnormalities, moderately calcified AV, bilateral atria massively dilated. Pulmonary pressure 18mmHG. BNP 983 on admission, only slightly elevated from prior. CXR with vascular congestion. - continue coreg - hold home lasix - will be cautious with IVF/transfusions  Afib  Transitioned from warfarin to eliquis in 12/2017. Rate controlled on admission. - hold home eliquis given bleed  HLD Last lipid panel 05/2017 wnl. Not currently on a statin.  Protein Calorie Malnutrition Albumin 2.5, down from 3.6 in 12/2017. Down 26lbs from 2 years ago. - consider nutrition consult once acute bleed managed. - feeding supplement  FEN/GI: NPO Prophylaxis: SCDs  Disposition: admit to med telemetry, attending Dr. Andria Frames  History of Present Illness:  Jennifer Hinton is a 83 y.o. female presenting with symptomatic anemia. She noted feeling weak with walking a few days ago and so presented to the clinic today. Notes she started filling up quickly when she ate starting about a week ago. Notes nausea today, no vomiting. No abdominal pain. One day last week notes having diarrhea, brown in color. Hasn't noticed any bright red blood in her stool but she has noticed black, tarry stools recently. Sometimes she has hard stools, alternates with diarrhea, going on a couple of weeks. Noticed 1Lb weight loss in the past couple of weeks. Lives with 63yo granddaughter. Walks with walker and cane while she is out in public, does not use them at home. Independent in ADLs, needs help with some IADLs. Denies FH of colon cancer.  Last meal was this morning.  Review Of Systems: Per HPI with the following additions:   Review of Systems  Constitutional: Positive for chills and malaise/fatigue. Negative for fever.  Eyes: Negative for blurred vision and double vision.   Respiratory: Negative for cough, shortness of breath and wheezing.   Cardiovascular: Negative for chest pain.  Gastrointestinal: Positive for nausea. Negative for abdominal pain and vomiting.  Genitourinary: Positive for frequency. Negative for dysuria and urgency.  Skin: Negative for rash.  Neurological: Negative for tremors and headaches.    Patient Active Problem List   Diagnosis Date Noted  . Fatigue associated with anemia 04/23/2018  . Microcytic anemia 12/19/2017  . Iron deficiency anemia 12/19/2017  . Weight loss 12/19/2017  . CKD (chronic kidney disease) stage 4, GFR 15-29 ml/min (HCC) 12/19/2017  . AKI (acute kidney injury) ([place]) 12/17/2017  . A-fib ([place]) 12/11/2017  . Weakness   . Healthcare maintenance 10/17/2015  . Post-menopause 10/17/2015  . Hemarthrosis 12/19/2014  . Chronic diastolic congestive heart failure ([place]) 10/03/2014  . Essential hypertension, benign 06/14/2014  . Knee pain 06/14/2014  . Hypertensive cardiovascular disease- LVH 06/06/2014  . Pulmonary hypertension-severe 06/06/2014  . Tricuspid regurgitation-severe 06/06/2014  . Malnutrition of moderate degree ([place]) 06/06/2014  . Acute on chronic diastolic congestive heart failure ([place])   . Dyspnea 03/09/2014  . Falls 03/09/2014  . Tinnitus 10/05/2012  . Encounter for long-term (current) use of anticoagulants 05/23/2010  . LIPOMA 11/30/2008  . PROTEINURIA 11/30/2008  . Gout, unspecified 06/27/2008  . HTN (hypertension) 12/17/2006  . Dyslipidemia 04/17/2006  . OBESITY, NOS 04/17/2006  . Permanent atrial fibrillation 04/17/2006  . Osteoarthrosis, unspecified whether generalized or localized, involving lower leg 04/17/2006    Past Medical History: Past Medical History:  Diagnosis Date  . Anemia due to blood loss, acute 11/2017  . Atrial fibrillation, permanent   . Chronic diastolic CHF (congestive heart failure) ([place])   . Hyperlipidemia   . Hypertension   . Pulmonary hypertension ([place])   . RA  (rheumatoid arthritis) ([place])     Past Surgical History: Past Surgical History:  Procedure Laterality Date  . COLONOSCOPY WITH PROPOFOL N/A 12/16/2017   Procedure: COLONOSCOPY WITH PROPOFOL;  Surgeon: Wonda Horner, MD;  [facility];  Service: Endoscopy;  Laterality: N/A;  . ESOPHAGOGASTRODUODENOSCOPY (EGD) WITH PROPOFOL N/A 12/16/2017   Procedure: ESOPHAGOGASTRODUODENOSCOPY (EGD) WITH PROPOFOL;  Surgeon: Wonda Horner, MD;  [facility];  Service: Endoscopy;  Laterality: N/A;  . NO PAST SURGERIES    . POLYPECTOMY  12/16/2017   Procedure: POLYPECTOMY;  Surgeon: Wonda Horner, MD;  Location: [facility];  Service: Endoscopy;;    Social History: Social History   Tobacco Use  . Smoking status: Never Smoker  . Smokeless tobacco: Never Used  Substance Use Topics  . Alcohol use: No  . Drug use: No   Additional social history:   Please also refer to relevant sections of EMR.  Family History: Family History  Problem Relation Age of Onset  . Heart disease Brother   . Hyperlipidemia Brother     Allergies and Medications: No Known Allergies No current facility-administered medications on file prior to encounter.    Current Outpatient Medications on File Prior to Encounter  Medication Sig Dispense Refill  . acetaminophen (TYLENOL) 650 MG CR tablet Take 650 mg by mouth as needed for pain (knee pain).     Marland Kitchen amLODipine (NORVASC) 10 MG tablet TAKE 1 TABLET BY MOUTH EVERY DAY (Patient taking differently: Take  10 mg by mouth daily. ) 90 tablet 2  . carvedilol (COREG) 25 MG tablet TAKE 1 TABLET BY MOUTH TWICE A DAY WITH A MEAL. (Patient taking differently: Take 25 mg by mouth 2 (two) times daily with a meal. ) 180 tablet 2  . ELIQUIS 2.5 MG TABS tablet TAKE 1 TABLET BY MOUTH TWICE A DAY (Patient taking differently: Take 2.5 mg by mouth 2 (two) times daily. ) 60 tablet 1  . feeding supplement, ENSURE ENLIVE, (ENSURE ENLIVE) LIQD Take 237 mLs by mouth 2 (two) times  daily between meals. (Patient taking differently: Take 237 mLs by mouth daily. ) 237 mL 12  . ferrous sulfate 325 (65 FE) MG tablet Take 1 tablet (325 mg total) by mouth 2 (two) times daily with a meal. 60 tablet 3  . furosemide (LASIX) 40 MG tablet Take 1 tablet (40 mg total) by mouth as needed for fluid (take if you gain 3lbs in a day or 5lbs in a week.). (Patient taking differently: Take 20-40 mg by mouth as needed for fluid (take if you gain 3lbs in a day or 5lbs in a week.). ) 90 tablet 3  . hydrALAZINE (APRESOLINE) 100 MG tablet TAKE 1 TABLET (100 MG TOTAL) BY MOUTH 3 (THREE) TIMES DAILY. 270 tablet 0  . bisacodyl (DULCOLAX) 10 MG suppository Place 1 suppository (10 mg total) rectally daily as needed for moderate constipation. (Patient not taking: Reported on 02/03/2018) 12 suppository 0  . polyethylene glycol (MIRALAX) packet Take 17 g by mouth daily as needed for mild constipation. (Patient not taking: Reported on 02/03/2018) 14 each 0  . senna (SENOKOT) 8.6 MG TABS tablet Take 1 tablet (8.6 mg total) by mouth at bedtime as needed for mild constipation. (Patient not taking: Reported on 02/03/2018) 120 each 0    Objective: BP 119/68   Pulse 71   Resp 19   SpO2 99%  Exam: General: alert, awake, lying in bed Eyes: PERRL, pale conjunctiva ENTM: MMM, fair dentition Cardiovascular: irregular rate and rhythm, loud systolic murmur, no LE edema Respiratory: CTAB, shallow respirations, no wheezing/rales Gastrointestinal: soft, NTND, +BS MSK: 4/5 strength in U/LE bilaterally Derm: no lesions visible Neuro: alert and oriented x3 Psych: mood and affect appropriate  Labs and Imaging: CBC BMET  Recent Labs  Lab 04/23/18 1603  WBC 3.9*  HGB 5.0*  HCT 16.1*  PLT 185   Recent Labs  Lab 04/23/18 1603  NA 133*  K 3.0*  CL 106  CO2 17*  BUN 82*  CREATININE 3.66*  GLUCOSE 139*  CALCIUM 8.4*     Dg Chest 1 View  Result Date: 04/23/2018 CLINICAL DATA:  Initial evaluation for acute  shortness of breath. EXAM: CHEST  1 VIEW COMPARISON:  Prior radiograph from 12/11/2017. FINDINGS: Advanced cardiomegaly, similar to previous. Mediastinal silhouette within normal limits. Aortic atherosclerosis. Lungs normally inflated. Prominent perihilar vascular congestion with mild diffuse interstitial congestion. Probable left pleural effusion. No focal infiltrates. No pneumothorax. No acute osseous finding. IMPRESSION: 1. Cardiomegaly with mild diffuse pulmonary interstitial edema and small left pleural effusion, suggesting CHF. 2. Aortic atherosclerosis. Electronically Signed   By: Jeannine Boga M.D.   On: 04/23/2018 16:59   Rory Percy, DO 04/23/2018, 6:21 PM PGY-2, [place] Intern pager: [phone], text pages welcome

## 2018-04-23 NOTE — ED Notes (Signed)
Consent for blood transfusion obtained and at bedside.

## 2018-04-23 NOTE — ED Triage Notes (Signed)
Pt here from MD office after finger stick showed hgb of 4.5.

## 2018-04-23 NOTE — ED Notes (Signed)
Pts stool was black when checked by PA.

## 2018-04-23 NOTE — ED Provider Notes (Signed)
[facility] EMERGENCY DEPARTMENT Provider Note   CSN: [id number] Arrival date & time: 04/23/18  1557    History   Chief Complaint Chief Complaint  Patient presents with  . Abnormal Lab    HPI Jennifer Hinton is a 83 y.o. female with a past medical history of pulmonary hypertension, hypertension, hyperlipidemia, CHF, atrial fibrillation currently anticoagulated on Eliquis, prior GI bleed requiring transfusion in October 2019 presents to ED with a chief complaint of abnormal lab work.  Patient saw her PCP today for ongoing generalized fatigue, decreased appetite, shortness of breath for the past several weeks.  Fingerstick hemoglobin checked in the office today was 4.5.  She has not noticed any black or tarry stools.  She denies any vomiting but does have nausea.  She last took her Eliquis this morning.  She reports shortness of breath worse with ambulation.  Denies any abdominal pain, chest pain.  No recent injuries or falls.     HPI  Past Medical History:  Diagnosis Date  . Anemia due to blood loss, acute 11/2017  . Atrial fibrillation, permanent   . Chronic diastolic CHF (congestive heart failure) ([place])   . Hyperlipidemia   . Hypertension   . Pulmonary hypertension ([place])   . RA (rheumatoid arthritis) [facility])     Patient Active Problem List   Diagnosis Date Noted  . Fatigue associated with anemia 04/23/2018  . Microcytic anemia 12/19/2017  . Iron deficiency anemia 12/19/2017  . Weight loss 12/19/2017  . CKD (chronic kidney disease) stage 4, GFR 15-29 ml/min (HCC) 12/19/2017  . AKI (acute kidney injury) ([place]) 12/17/2017  . A-fib ([place]) 12/11/2017  . Weakness   . Healthcare maintenance 10/17/2015  . Post-menopause 10/17/2015  . Hemarthrosis 12/19/2014  . Chronic diastolic congestive heart failure ([place]) 10/03/2014  . Essential hypertension, benign 06/14/2014  . Knee pain 06/14/2014  . Hypertensive cardiovascular disease- LVH 06/06/2014  . Pulmonary  hypertension-severe 06/06/2014  . Tricuspid regurgitation-severe 06/06/2014  . Malnutrition of moderate degree ([place]) 06/06/2014  . Acute on chronic diastolic congestive heart failure ([place])   . Dyspnea 03/09/2014  . Falls 03/09/2014  . Tinnitus 10/05/2012  . Encounter for long-term (current) use of anticoagulants 05/23/2010  . LIPOMA 11/30/2008  . PROTEINURIA 11/30/2008  . Gout, unspecified 06/27/2008  . HTN (hypertension) 12/17/2006  . Dyslipidemia 04/17/2006  . OBESITY, NOS 04/17/2006  . Permanent atrial fibrillation 04/17/2006  . Osteoarthrosis, unspecified whether generalized or localized, involving lower leg 04/17/2006    Past Surgical History:  Procedure Laterality Date  . COLONOSCOPY WITH PROPOFOL N/A 12/16/2017   Procedure: COLONOSCOPY WITH PROPOFOL;  Surgeon: Wonda Horner, MD;  Location: [facility] ENDOSCOPY;  Service: Endoscopy;  Laterality: N/A;  . ESOPHAGOGASTRODUODENOSCOPY (EGD) WITH PROPOFOL N/A 12/16/2017   Procedure: ESOPHAGOGASTRODUODENOSCOPY (EGD) WITH PROPOFOL;  Surgeon: Wonda Horner, MD;  Location: [facility] ENDOSCOPY;  Service: Endoscopy;  Laterality: N/A;  . NO PAST SURGERIES    . POLYPECTOMY  12/16/2017   Procedure: POLYPECTOMY;  Surgeon: Wonda Horner, MD;  Location: [facility];  Service: Endoscopy;;     OB History   No obstetric history on file.      Home Medications    Prior to Admission medications   Medication Sig Start Date End Date Taking? Authorizing Provider  acetaminophen (TYLENOL) 650 MG CR tablet Take 650 mg by mouth as needed for pain (knee pain).    Yes [provider]  amLODipine (NORVASC) 10 MG tablet TAKE 1 TABLET BY MOUTH EVERY DAY Patient taking  differently: Take 10 mg by mouth daily.  12/23/17  Yes Lelon Perla, MD  carvedilol (COREG) 25 MG tablet TAKE 1 TABLET BY MOUTH TWICE A DAY WITH A MEAL. Patient taking differently: Take 25 mg by mouth 2 (two) times daily with a meal.  03/18/18  Yes Meng, Hao, PA  ELIQUIS 2.5 MG TABS  tablet TAKE 1 TABLET BY MOUTH TWICE A DAY Patient taking differently: Take 2.5 mg by mouth 2 (two) times daily.  02/25/18  Yes Diallo, Abdoulaye, MD  feeding supplement, ENSURE ENLIVE, (ENSURE ENLIVE) LIQD Take 237 mLs by mouth 2 (two) times daily between meals. Patient taking differently: Take 237 mLs by mouth daily.  12/19/17  Yes Debbe Odea, MD  ferrous sulfate 325 (65 FE) MG tablet Take 1 tablet (325 mg total) by mouth 2 (two) times daily with a meal. 12/17/17  Yes Rizwan, Eunice Blase, MD  furosemide (LASIX) 40 MG tablet Take 1 tablet (40 mg total) by mouth as needed for fluid (take if you gain 3lbs in a day or 5lbs in a week.). Patient taking differently: Take 20-40 mg by mouth as needed for fluid (take if you gain 3lbs in a day or 5lbs in a week.).  02/03/18 05/04/18 Yes Barrett, Evelene Croon, PA-C  hydrALAZINE (APRESOLINE) 100 MG tablet TAKE 1 TABLET (100 MG TOTAL) BY MOUTH 3 (THREE) TIMES DAILY. 03/20/18  Yes Lelon Perla, MD  bisacodyl (DULCOLAX) 10 MG suppository Place 1 suppository (10 mg total) rectally daily as needed for moderate constipation. Patient not taking: Reported on 02/03/2018 12/19/17   Debbe Odea, MD  polyethylene glycol [facility]) packet Take 17 g by mouth daily as needed for mild constipation. Patient not taking: Reported on 02/03/2018 12/19/17   Debbe Odea, MD  senna (SENOKOT) 8.6 MG TABS tablet Take 1 tablet (8.6 mg total) by mouth at bedtime as needed for mild constipation. Patient not taking: Reported on 02/03/2018 12/19/17   Debbe Odea, MD    Family History Family History  Problem Relation Age of Onset  . Heart disease Brother   . Hyperlipidemia Brother     Social History Social History   Tobacco Use  . Smoking status: Never Smoker  . Smokeless tobacco: Never Used  Substance Use Topics  . Alcohol use: No  . Drug use: No     Allergies   Patient has no known allergies.   Review of Systems Review of Systems  Constitutional: Positive for appetite  change and fatigue. Negative for chills and fever.  HENT: Negative for ear pain, rhinorrhea, sneezing and sore throat.   Eyes: Negative for photophobia and visual disturbance.  Respiratory: Positive for shortness of breath. Negative for cough, chest tightness and wheezing.   Cardiovascular: Negative for chest pain and palpitations.  Gastrointestinal: Negative for abdominal pain, blood in stool, constipation, diarrhea, nausea and vomiting.  Genitourinary: Negative for dysuria, hematuria and urgency.  Musculoskeletal: Negative for myalgias.  Skin: Negative for rash.  Neurological: Negative for dizziness, weakness and light-headedness.     Physical Exam Updated Vital Signs BP 119/68   Pulse 71   Resp 19   SpO2 99%   Physical Exam Vitals signs and nursing note reviewed. Exam conducted with a chaperone present.  Constitutional:      General: She is not in acute distress.    Appearance: She is well-developed.  HENT:     Head: Normocephalic and atraumatic.     Nose: Nose normal.  Eyes:     General: No scleral icterus.  Right eye: No discharge.        Left eye: No discharge.     Conjunctiva/sclera: Conjunctivae normal.  Neck:     Musculoskeletal: Normal range of motion and neck supple.  Cardiovascular:     Rate and Rhythm: Normal rate and regular rhythm.     Heart sounds: Normal heart sounds. No murmur. No friction rub. No gallop.   Pulmonary:     Effort: Pulmonary effort is normal. No respiratory distress.     Breath sounds: Normal breath sounds.  Abdominal:     General: Bowel sounds are normal. There is no distension.     Palpations: Abdomen is soft.     Tenderness: There is no abdominal tenderness. There is no guarding.  Genitourinary:    Rectum: Guaiac result positive.     Comments: Stool is grossly melanotic. Musculoskeletal: Normal range of motion.  Skin:    General: Skin is warm and dry.     Findings: No rash.  Neurological:     Mental Status: She is alert.      Motor: No abnormal muscle tone.     Coordination: Coordination normal.      ED Treatments / Results  Labs (all labs ordered are listed, but only abnormal results are displayed) Labs Reviewed  COMPREHENSIVE METABOLIC PANEL - Abnormal; Notable for the following components:      Result Value   Sodium 133 (*)    Potassium 3.0 (*)    CO2 17 (*)    Glucose, Bld 139 (*)    BUN 82 (*)    Creatinine, Ser 3.66 (*)    Calcium 8.4 (*)    Albumin 2.5 (*)    GFR calc non Af Amer 11 (*)    GFR calc Af Amer 13 (*)    All other components within normal limits  CBC WITH DIFFERENTIAL/PLATELET - Abnormal; Notable for the following components:   WBC 3.9 (*)    RBC 1.65 (*)    Hemoglobin 5.0 (*)    HCT 16.1 (*)    RDW 16.9 (*)    Lymphs Abs 0.5 (*)    Monocytes Absolute 0.0 (*)    All other components within normal limits  PROTIME-INR - Abnormal; Notable for the following components:   Prothrombin Time 17.7 (*)    INR 1.5 (*)    All other components within normal limits  APTT - Abnormal; Notable for the following components:   aPTT 42 (*)    All other components within normal limits  BRAIN NATRIURETIC PEPTIDE - Abnormal; Notable for the following components:   B Natriuretic Peptide 983.0 (*)    All other components within normal limits  IRON AND TIBC - Abnormal; Notable for the following components:   Iron 19 (*)    TIBC 228 (*)    Saturation Ratios 8 (*)    All other components within normal limits  RETICULOCYTES - Abnormal; Notable for the following components:   Retic Ct Pct 4.6 (*)    RBC. 1.48 (*)    Immature Retic Fract 27.3 (*)    All other components within normal limits  POC OCCULT BLOOD, ED - Abnormal; Notable for the following components:   Fecal Occult Bld POSITIVE (*)    All other components within normal limits  VITAMIN B12  FOLATE  FERRITIN  TYPE AND SCREEN  PREPARE RBC (CROSSMATCH)  ABO/RH    EKG EKG Interpretation  Date/Time:  Thursday April 23 2018  16:38:43 EST Ventricular Rate:  61  PR Interval:    QRS Duration: 112 QT Interval:  437 QTC Calculation: 475 R Axis:   91 Text Interpretation:  Atrial fibrillation Ventricular tachycardia, unsustained Borderline intraventricular conduction delay Nonspecific T abnormalities, lateral leads No STEMI.  Confirmed by Nanda Quinton [phone]) on 04/23/2018 4:53:58 PM   Radiology Dg Chest 1 View  Result Date: 04/23/2018 CLINICAL DATA:  Initial evaluation for acute shortness of breath. EXAM: CHEST  1 VIEW COMPARISON:  Prior radiograph from 12/11/2017. FINDINGS: Advanced cardiomegaly, similar to previous. Mediastinal silhouette within normal limits. Aortic atherosclerosis. Lungs normally inflated. Prominent perihilar vascular congestion with mild diffuse interstitial congestion. Probable left pleural effusion. No focal infiltrates. No pneumothorax. No acute osseous finding. IMPRESSION: 1. Cardiomegaly with mild diffuse pulmonary interstitial edema and small left pleural effusion, suggesting CHF. 2. Aortic atherosclerosis. Electronically Signed   By: Jeannine Boga M.D.   On: 04/23/2018 16:59    Procedures Procedures (including critical care time)  CRITICAL CARE Performed by: Delia Heady   Total critical care time: 35 minutes  Critical care time was exclusive of separately billable procedures and treating other patients.  Critical care was necessary to treat or prevent imminent or life-threatening deterioration.  Critical care was time spent personally by me on the following activities: development of treatment plan with patient and/or surrogate as well as nursing, discussions with consultants, evaluation of patient's response to treatment, examination of patient, obtaining history from patient or surrogate, ordering and performing treatments and interventions, ordering and review of laboratory studies, ordering and review of radiographic studies, pulse oximetry and re-evaluation of patient's  condition.   Medications Ordered in ED Medications  0.9 %  sodium chloride infusion (Manually program via Guardrails IV Fluids) (has no administration in time range)  pantoprazole (PROTONIX) injection 40 mg (has no administration in time range)     Initial Impression / Assessment and Plan / ED Course  I have reviewed the triage vital signs and the nursing notes.  Pertinent labs & imaging results that were available during my care of the patient were reviewed by me and considered in my medical decision making (see chart for details).  Clinical Course as of Apr 23 1822  Thu Apr 23, 2018  1707 Hemoglobin(!!): 5.0 [HK]  1707 Fecal Occult Blood, POC(!): POSITIVE [HK]  1707 INR(!): 1.5 [HK]  1757 Spoke to [facility] Dr. Therisa Doyne.  Recommends IV Protonix, clear liquids today and n.p.o. after midnight.  No reversal indicated at this time.   [HK]  1824 Oral temp is 97.8 F   [HK]    Clinical Course User Index [HK] Delia Heady, PA-C       83 year old female with a past medical history of hypertension, CHF, atrial fibrillation currently anticoagulated on Eliquis presents to ED with a chief complaint of abnormal lab work.  Found to have an i-STAT hemoglobin of 4.5 in PCPs office today.  She presented there for shortness of breath, fatigue and decreased appetite for the past several weeks.  History of GI bleed requiring transfusion in the past.  Hemoglobin here on recheck is 5.  Hemoccult is positive, INR is 1.5.  [facility] recommends protonix IV, n.p.o. after midnight.  Patient hemodynamically stable here.  Will admit to family medicine.    Portions of this note were generated with Lobbyist. Dictation errors may occur despite best attempts at proofreading.   Final Clinical Impressions(s) / ED Diagnoses   Final diagnoses:  Symptomatic anemia  Acute GI bleeding    ED Discharge Orders  None       Delia Heady, PA-C 04/23/18 Melynda Ripple, MD 04/24/18  1125

## 2018-04-23 NOTE — Progress Notes (Signed)
   Subjective:    Patient ID: Jennifer Hinton, female    DOB: 1935-09-08, 83 y.o.   MRN: [id number]   CC: Fatigue   HPI: Patient is a 83 year old female with past medical history significant for atrial fibrillation, CHF, hyperlipidemia, hypertension who presents today with daughter complaining of fatigue and weakness for the past few days. She denies any dizziness or shortness of breath. Patient reported two episodes of nosebleeds in the past few weeks.  Patient denies any chest pain, abdominal pain, headache, tarry stools.  Patient was hospitalized back in October and was transfused 4 units.  Colonoscopy was negative for any GI bleed.  Smoking status reviewed   ROS: all other systems were reviewed and are negative other than in the HPI   Past Medical History:  Diagnosis Date  . Anemia due to blood loss, acute 11/2017  . Atrial fibrillation, permanent   . Chronic diastolic CHF (congestive heart failure) ([place])   . Hyperlipidemia   . Hypertension   . Pulmonary hypertension ([place])   . RA (rheumatoid arthritis) ([place])     Past Surgical History:  Procedure Laterality Date  . COLONOSCOPY WITH PROPOFOL N/A 12/16/2017   Procedure: COLONOSCOPY WITH PROPOFOL;  Surgeon: Wonda Horner, MD;  Location: [facility];  Service: Endoscopy;  Laterality: N/A;  . ESOPHAGOGASTRODUODENOSCOPY (EGD) WITH PROPOFOL N/A 12/16/2017   Procedure: ESOPHAGOGASTRODUODENOSCOPY (EGD) WITH PROPOFOL;  Surgeon: Wonda Horner, MD;  Location: [facility];  Service: Endoscopy;  Laterality: N/A;  . NO PAST SURGERIES    . POLYPECTOMY  12/16/2017   Procedure: POLYPECTOMY;  Surgeon: Wonda Horner, MD;  Location: [facility];  Service: Endoscopy;;    Past medical history, surgical, family, and social history reviewed and updated in the EMR as appropriate.  Objective:  There were no vitals taken for this visit.  Vitals and nursing note reviewed  General: NAD, pleasant, able to participate in exam Cardiac: RRR, normal  heart sounds, no murmurs. 2+ radial and PT pulses bilaterally Respiratory: CTAB, normal effort, No wheezes, rales or rhonchi Abdomen: soft, nontender, nondistended, no hepatic or splenomegaly, +BS Extremities: no edema or cyanosis. WWP. Skin: warm and dry, no rashes noted Neuro: alert and oriented x4, no focal deficits Psych: Normal affect and mood   Assessment & Plan:   Fatigue associated with anemia Patient presented today to clinic complaining of fatigue weakness for the past few weeks.  Patient denies any shortness of breath, dizziness, fall or melena.  Point-of-care hemoglobin 4.5.  Patient is currently on Eliquis.  She was transitioned from warfarin in November.  Given profound anemia and history of recent transfusions in the setting of iron deficiency anemia, we will send patient to the ED for further management.  Patient will likely need admission.  Family medicine teaching service has been notified.   [name], MD [place] PGY-3

## 2018-04-23 NOTE — Progress Notes (Signed)
ER RN called for report. Room ready.

## 2018-04-24 DIAGNOSIS — R6881 Early satiety: Secondary | ICD-10-CM

## 2018-04-24 DIAGNOSIS — Z7901 Long term (current) use of anticoagulants: Secondary | ICD-10-CM

## 2018-04-24 DIAGNOSIS — R634 Abnormal weight loss: Secondary | ICD-10-CM

## 2018-04-24 DIAGNOSIS — K922 Gastrointestinal hemorrhage, unspecified: Secondary | ICD-10-CM

## 2018-04-24 DIAGNOSIS — D649 Anemia, unspecified: Secondary | ICD-10-CM

## 2018-04-24 DIAGNOSIS — I4891 Unspecified atrial fibrillation: Secondary | ICD-10-CM

## 2018-04-24 LAB — BASIC METABOLIC PANEL
Anion gap: 11 (ref 5–15)
Anion gap: 10 (ref 5–15)
BUN: 78 mg/dL — ABNORMAL HIGH (ref 8–23)
BUN: 79 mg/dL — ABNORMAL HIGH (ref 8–23)
CO2: 14 mmol/L — ABNORMAL LOW (ref 22–32)
CO2: 14 mmol/L — ABNORMAL LOW (ref 22–32)
Calcium: 8.3 mg/dL — ABNORMAL LOW (ref 8.9–10.3)
Calcium: 8 mg/dL — ABNORMAL LOW (ref 8.9–10.3)
Chloride: 108 mmol/L (ref 98–111)
Chloride: 110 mmol/L (ref 98–111)
Creatinine, Ser: 3.57 mg/dL — ABNORMAL HIGH (ref 0.44–1.00)
Creatinine, Ser: 3.8 mg/dL — ABNORMAL HIGH (ref 0.44–1.00)
GFR calc Af Amer: 13 mL/min — ABNORMAL LOW (ref >60)
GFR calc Af Amer: 12 mL/min — ABNORMAL LOW (ref >60)
GFR calc non Af Amer: 11 mL/min — ABNORMAL LOW (ref >60)
GFR, EST NON AFRICAN AMERICAN: 11 mL/min — AB (ref >60)
Glucose, Bld: 94 mg/dL (ref 70–99)
Glucose, Bld: 145 mg/dL — ABNORMAL HIGH (ref 70–99)
POTASSIUM: 3.7 mmol/L (ref 3.5–5.1)
Potassium: 3.3 mmol/L — ABNORMAL LOW (ref 3.5–5.1)
Sodium: 133 mmol/L — ABNORMAL LOW (ref 135–145)
Sodium: 134 mmol/L — ABNORMAL LOW (ref 135–145)

## 2018-04-24 LAB — PREPARE RBC (CROSSMATCH)

## 2018-04-24 LAB — HEMOGLOBIN AND HEMATOCRIT, BLOOD
HCT: 21.2 % — ABNORMAL LOW (ref 36.0–46.0)
HCT: 25 % — ABNORMAL LOW (ref 36.0–46.0)
Hemoglobin: 6.9 g/dL — CL (ref 12.0–15.0)
Hemoglobin: 8.1 g/dL — ABNORMAL LOW (ref 12.0–15.0)

## 2018-04-24 LAB — MAGNESIUM: Magnesium: 1.8 mg/dL (ref 1.7–2.4)

## 2018-04-24 MED ORDER — SODIUM CHLORIDE 0.9% IV SOLUTION
Freq: Once | INTRAVENOUS | Status: AC
  Administered 2018-04-24: 15:00:00 via INTRAVENOUS

## 2018-04-24 MED ORDER — POTASSIUM CHLORIDE CRYS ER 20 MEQ PO TBCR
40.0000 meq | EXTENDED_RELEASE_TABLET | Freq: Once | ORAL | Status: AC
  Administered 2018-04-24: 40 meq via ORAL
  Filled 2018-04-24: qty 2

## 2018-04-24 MED ORDER — SODIUM CHLORIDE 0.9 % IV SOLN
INTRAVENOUS | Status: DC
  Administered 2018-04-24 – 2018-04-25 (×2): via INTRAVENOUS

## 2018-04-24 MED ORDER — SODIUM CHLORIDE 0.9% IV SOLUTION
Freq: Once | INTRAVENOUS | Status: AC
  Administered 2018-04-26: 08:00:00 via INTRAVENOUS

## 2018-04-24 NOTE — Consult Note (Signed)
[facility] Consultation Note  Referring Provider:  Family Medicine Teaching service Primary Care Physician:  [name], MD  Reason for Consultation:  Anemia, blood in stool  HPI: Jennifer Hinton is a 83 y.o. female presenting with progressive shortness of breath.  Has multiple comorbidities, including atrial fibrillation and valvular heart disease, on apixaban.  Had admission for anemia and hemoccult-positive stool in October 2019.  Had endoscopy and colonoscopy at that time, which were unrevealing.  Has had recurrent shortness of breath and occasional dark stools over the past several weeks; found to have Hgb ~ 7 and was readmitted.  Has had some weight loss due to early satiety over the past several weeks.  No dysphagia or hematochezia or change in bowel habits.  No abdominal pain.   Past Medical History:  Diagnosis Date  . Anemia due to blood loss, acute 11/2017  . Atrial fibrillation, permanent   . Chronic diastolic CHF (congestive heart failure) ([place])   . Hyperlipidemia   . Hypertension   . Pulmonary hypertension ([place])   . RA (rheumatoid arthritis) ([place])     Past Surgical History:  Procedure Laterality Date  . COLONOSCOPY WITH PROPOFOL N/A 12/16/2017   Procedure: COLONOSCOPY WITH PROPOFOL;  Surgeon: Wonda Horner, MD;  Location: [facility];  Service: Endoscopy;  Laterality: N/A;  . ESOPHAGOGASTRODUODENOSCOPY (EGD) WITH PROPOFOL N/A 12/16/2017   Procedure: ESOPHAGOGASTRODUODENOSCOPY (EGD) WITH PROPOFOL;  Surgeon: Wonda Horner, MD;  Location: [facility];  Service: Endoscopy;  Laterality: N/A;  . NO PAST SURGERIES    . POLYPECTOMY  12/16/2017   Procedure: POLYPECTOMY;  Surgeon: Wonda Horner, MD;  Location: [facility];  Service: Endoscopy;;    Prior to Admission medications   Medication Sig Start Date End Date Taking? Authorizing Provider  acetaminophen (TYLENOL) 650 MG CR tablet Take 650 mg by mouth as needed for pain (knee pain).    Yes [provider]  amLODipine (NORVASC) 10 MG tablet TAKE 1 TABLET BY MOUTH EVERY DAY Patient taking differently: Take 10 mg by mouth daily.  12/23/17  Yes Lelon Perla, MD  carvedilol (COREG) 25 MG tablet TAKE 1 TABLET BY MOUTH TWICE A DAY WITH A MEAL. Patient taking differently: Take 25 mg by mouth 2 (two) times daily with a meal.  03/18/18  Yes Meng, Hao, PA  ELIQUIS 2.5 MG TABS tablet TAKE 1 TABLET BY MOUTH TWICE A DAY Patient taking differently: Take 2.5 mg by mouth 2 (two) times daily.  02/25/18  Yes Diallo, Abdoulaye, MD  feeding supplement, ENSURE ENLIVE, (ENSURE ENLIVE) LIQD Take 237 mLs by mouth 2 (two) times daily between meals. Patient taking differently: Take 237 mLs by mouth daily.  12/19/17  Yes Debbe Odea, MD  ferrous sulfate 325 (65 FE) MG tablet Take 1 tablet (325 mg total) by mouth 2 (two) times daily with a meal. 12/17/17  Yes Rizwan, Eunice Blase, MD  furosemide (LASIX) 40 MG tablet Take 1 tablet (40 mg total) by mouth as needed for fluid (take if you gain 3lbs in a day or 5lbs in a week.). Patient taking differently: Take 20-40 mg by mouth as needed for fluid (take if you gain 3lbs in a day or 5lbs in a week.).  02/03/18 05/04/18 Yes Barrett, Evelene Croon, PA-C  hydrALAZINE (APRESOLINE) 100 MG tablet TAKE 1 TABLET (100 MG TOTAL) BY MOUTH 3 (THREE) TIMES DAILY. 03/20/18  Yes Lelon Perla, MD  bisacodyl (DULCOLAX) 10 MG suppository Place 1 suppository (10 mg total) rectally daily as needed  for moderate constipation. Patient not taking: Reported on 02/03/2018 12/19/17   Debbe Odea, MD  polyethylene glycol [facility]) packet Take 17 g by mouth daily as needed for mild constipation. Patient not taking: Reported on 02/03/2018 12/19/17   Debbe Odea, MD  senna (SENOKOT) 8.6 MG TABS tablet Take 1 tablet (8.6 mg total) by mouth at bedtime as needed for mild constipation. Patient not taking: Reported on 02/03/2018 12/19/17   Debbe Odea, MD    Current Facility-Administered Medications   Medication Dose Route Frequency Provider Last Rate Last Dose  . 0.9 %  sodium chloride infusion (Manually program via Guardrails IV Fluids)   Intravenous Once Matilde Haymaker, MD      . acetaminophen (TYLENOL) tablet 650 mg  650 mg Oral Q6H PRN Rory Percy, DO       Or  . acetaminophen (TYLENOL) suppository 650 mg  650 mg Rectal Q6H PRN Rory Percy, DO      . amLODipine (NORVASC) tablet 10 mg  10 mg Oral Daily Rory Percy, DO   10 mg at 04/24/18 1035  . carvedilol (COREG) tablet 25 mg  25 mg Oral BID WC Rumball, Alison, DO   25 mg at 04/24/18 1037  . feeding supplement (ENSURE ENLIVE) (ENSURE ENLIVE) liquid 237 mL  237 mL Oral BID BM Rumball, Alison, DO      . hydrALAZINE (APRESOLINE) tablet 100 mg  100 mg Oral TID Rory Percy, DO   100 mg at 04/24/18 1034  . pantoprazole (PROTONIX) injection 40 mg  40 mg Intravenous Q12H Rory Percy, DO   40 mg at 04/24/18 1036    Allergies as of 04/23/2018  . (No Known Allergies)    Family History  Problem Relation Age of Onset  . Heart disease Brother   . Hyperlipidemia Brother     Social History   Socioeconomic History  . Marital status: Widowed    Spouse name: Not on file  . Number of children: 6  . Years of education: 9th grade   . Highest education level: Not on file  Occupational History  . 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 Financial resource strain: Not on file  . Food insecurity:    Worry: Not on file    Inability: Not on file  . Transportation needs:    Medical: Not on file    Non-medical: Not on file  Tobacco Use  . Smoking status: Never Smoker  . Smokeless tobacco: Never Used  Substance and Sexual Activity  . Alcohol use: No  . Drug use: No  . Sexual activity: Not on file  Lifestyle  . Physical activity:    Days per week: Not on file    Minutes per session: Not on file  . Stress: Not  on file  Relationships  . Social connections:    Talks on phone: Not on file    Gets together: Not on file    Attends religious service: Not on file    Active member of club or organization: Not on file    Attends meetings of clubs or organizations: Not on file    Relationship status: Not on file  . Intimate partner violence:    Fear of current or ex partner: Not on file    Emotionally abused: Not on file    Physically abused: Not on file  Forced sexual activity: Not on file  Other Topics Concern  . Not on file  Social History Narrative   Had 6 children.   5 children living (oldest daughter passed away).    2 living in [place], 2 living in [place], 1 living in [place].       Raising your 60 yo great grandson Dorris Fetch, also a clinic patient).    Drives. Recently renewed license. Does not own a car so transportation is sometimes a limiting factor.              Review of Systems: As per HPI, all others negative  Physical Exam: Vital signs in last 24 hours: Temp:  [98.1 F (36.7 C)-98.6 F (37 C)] 98.1 F (36.7 C) (03/06 0841) Pulse Rate:  [64-103] 77 (03/06 0841) Resp:  [15-29] 20 (03/06 0841) BP: (119-145)/(57-82) 142/67 (03/06 0841) SpO2:  [96 %-100 %] 96 % (03/06 0841) Weight:  [57.6 kg] 57.6 kg (03/06 0100) Last BM Date: 04/23/18 General:   Alert, thin, somewhat cachectic-appearing, pleasant and cooperative in NAD Head:  Normocephalic and atraumatic. Eyes:  Sclera clear, no icterus.   Conjunctiva pink. Ears:  Normal auditory acuity. Nose:  No deformity, discharge,  or lesions. Mouth:  No deformity or lesions.  Oropharynx pink & moist. Neck:  Supple; no masses or thyromegaly. Lungs:  Clear throughout to auscultation.   No wheezes, crackles, or rhonchi. No acute distress. Heart:  Regular rate and rhythm; III/VI SEM apex and RUSB Abdomen:  Soft, nontender and nondistended. No masses, hepatosplenomegaly or hernias noted. Normal bowel sounds, without guarding, and without rebound.      Msk:  Symmetrical without gross deformities. Normal posture. Pulses:  Normal pulses noted. Extremities:  Without clubbing or edema. Neurologic:  Alert and  oriented x4;  grossly normal neurologically. Skin:  Intact without significant lesions or rashes.. Psych:  Alert and cooperative. Normal mood and affect.   Lab Results: Recent Labs    04/23/18 1530 04/23/18 1603 04/24/18 0502  WBC  --  3.9* 4.3  HGB 4.5* 5.0* 6.8*  HCT  --  16.1* 20.9*  PLT  --  185 154   BMET Recent Labs    04/23/18 1603 04/24/18 0502  NA 133* 133*  K 3.0* 3.3*  CL 106 108  CO2 17* 14*  GLUCOSE 139* 94  BUN 82* 78*  CREATININE 3.66* 3.57*  CALCIUM 8.4* 8.3*   LFT Recent Labs    04/23/18 1603  PROT 7.5  ALBUMIN 2.5*  AST 28  ALT 8  ALKPHOS 39  BILITOT 0.6   PT/INR Recent Labs    04/23/18 1603  LABPROT 17.7*  INR 1.5*    Studies/Results: Dg Chest 1 View  Result Date: 04/23/2018 CLINICAL DATA:  Initial evaluation for acute shortness of breath. EXAM: CHEST  1 VIEW COMPARISON:  Prior radiograph from 12/11/2017. FINDINGS: Advanced cardiomegaly, similar to previous. Mediastinal silhouette within normal limits. Aortic atherosclerosis. Lungs normally inflated. Prominent perihilar vascular congestion with mild diffuse interstitial congestion. Probable left pleural effusion. No focal infiltrates. No pneumothorax. No acute osseous finding. IMPRESSION: 1. Cardiomegaly with mild diffuse pulmonary interstitial edema and small left pleural effusion, suggesting CHF. 2. Aortic atherosclerosis. Electronically Signed   By: Jeannine Boga M.D.   On: 04/23/2018 16:59    Impression:  1.  Recurrent anemia.  EGD and colonoscopy unrevealing October 2019. 2.  Dark stools (mild). 3.  Chronic anticoagulation. 4.  Multiple medical problems.  Plan:  1.  Soft diet today, NPO after midnight.  2.  Follow CBCs. 3.  Hold apixaban. 4.  PPI. 5.  Repeat EGD tomorrow with Dr. Cristina Gong; if this is negative,  would advise capsule endoscopy as next step in management. 6.  [facility] will follow.    LOS: 1 day   Kristan Votta M  04/24/2018, 11:17 AM  Cell [phone] If no answer or after 5 PM call [phone]

## 2018-04-24 NOTE — Progress Notes (Addendum)
Family Medicine Teaching Service Daily Progress Note Intern Pager: [phone]  Patient name: Jennifer Hinton Medical record number: [id number] Date of birth: [birth year] Age: 83 y.o. Gender: female  Primary Care Provider: [name], MD Consultants: GI Code Status: Full  Pt Overview and Major Events to Date:  3/5-admission for symptomatic anemia in the setting of likely lower GI bleed  Assessment and Plan: Jennifer Hinton is a 83 y.o. female presenting with symptomatic anemia 2/2 GI bleed. PMH is significant for HLD, gout, HTN, afib, diastolic CHF, pHTN, CKDIV.  We will transfuse and work-up for possible lower GI bleed.  Symptomatic anemia secondary to GI bleed Her hemoglobin has significantly improved after 2 units RBCs, last hemoglobin on 3/5 of 4.5 is increased to 6.8.  Denies nausea/stomach pain/vomiting.  Reports continued melena. -Transfuse 1 unit, consider second transfusion -Transfusion threshold < 8 -Follow-up posttransfusion hemoglobin -Consult GI, appreciate recommendations -Continue Protonix -Possible EGD 3/7  AKI on CKD stage IV Admission creatinine 3.66 (baseline 1.5 - 1.7).  BUN/creatinine ratio is greater than 20/1.  Likely prerenal. -Monitor creatinine with BMP following transfusion  Hypokalemia 3.0 on admission up to 3.3 following 2 units RBCs transfused and 50 mEq potassium chloride IV. -Continue to monitor -Replete PRN  Hypertensive Systolics 1 03-546 since admission, diastolics 56-81. -Continue amlodipine 10 mg daily -Continue carvedilol 25 mg twice daily -Continue hydralazine 100 mg 3 times daily  Diastolic CHF with pulmonary hypertension -Continue Coreg -Holding home Lasix  A. fib Holding Eliquis in the setting of GI bleed. -Continue to hold Eliquis  Protein calorie malnutrition -Nutrition consult following colonoscopy  FEN/GI: NPO for colonoscopy PPx: SCDs  Disposition: Anticipate 1-2 additional hospital days for appropriate procedure and  intervention.  Subjective:  No new complaints today.  She she reports that she did feel significantly fatigued with activity which is why she was initially seen yesterday.  She currently reports no stomach pain or nausea and never had any emesis.  Her only concern at this time is that she is feeling quite parched.  Objective: Temp:  [98.1 F (36.7 C)-98.6 F (37 C)] 98.4 F (36.9 C) (03/06 0546) Pulse Rate:  [64-103] 81 (03/06 0546) Resp:  [15-29] 18 (03/06 0546) BP: (119-145)/(57-82) 139/62 (03/06 0546) SpO2:  [96 %-100 %] 100 % (03/06 0546) Weight:  [57.6 kg] 57.6 kg (03/06 0100)  Physical Exam: General: Pale but well-appearing and energetic 83 year old woman. HEENT: Pale conjunctiva.  Dry mucous membranes.  No appreciable JVD. Cardio: Irregular rhythm.  Regular rate.  2/6 systolic murmur   Pulm: Clear to auscultation bilaterally, no crackles, wheezing, or diminished breath sounds. Normal respiratory effort Abdomen: Bowel sounds normal. Abdomen soft and non-tender.  Extremities: No peripheral edema. Warm/ well perfused.  Strong radial pulse.  Pale nailbeds and pale palmar creases. Neuro: Cranial nerves grossly intact   Laboratory: Recent Labs  Lab 04/23/18 1530 04/23/18 1603  WBC  --  3.9*  HGB 4.5* 5.0*  HCT  --  16.1*  PLT  --  185   Recent Labs  Lab 04/23/18 1603 04/24/18 0502  NA 133* 133*  K 3.0* 3.3*  CL 106 108  CO2 17* 14*  BUN 82* 78*  CREATININE 3.66* 3.57*  CALCIUM 8.4* 8.3*  PROT 7.5  --   BILITOT 0.6  --   ALKPHOS 39  --   ALT 8  --   AST 28  --   GLUCOSE 139* 94      Imaging/Diagnostic Tests: Dg Chest 1 View  Result Date: 04/23/2018  CLINICAL DATA:  Initial evaluation for acute shortness of breath. EXAM: CHEST  1 VIEW COMPARISON:  Prior radiograph from 12/11/2017. FINDINGS: Advanced cardiomegaly, similar to previous. Mediastinal silhouette within normal limits. Aortic atherosclerosis. Lungs normally inflated. Prominent perihilar vascular  congestion with mild diffuse interstitial congestion. Probable left pleural effusion. No focal infiltrates. No pneumothorax. No acute osseous finding. IMPRESSION: 1. Cardiomegaly with mild diffuse pulmonary interstitial edema and small left pleural effusion, suggesting CHF. 2. Aortic atherosclerosis. Electronically Signed   By: Jeannine Boga M.D.   On: 04/23/2018 16:59     Matilde Haymaker, MD 04/24/2018, 6:43 AM PGY-1, [place] Intern pager: [phone], text pages welcome

## 2018-04-25 ENCOUNTER — Inpatient Hospital Stay (HOSPITAL_COMMUNITY): Payer: Medicare Other | Admitting: Registered Nurse

## 2018-04-25 ENCOUNTER — Inpatient Hospital Stay (HOSPITAL_COMMUNITY): Payer: Medicare Other

## 2018-04-25 ENCOUNTER — Encounter (HOSPITAL_COMMUNITY): Payer: Self-pay

## 2018-04-25 ENCOUNTER — Encounter (HOSPITAL_COMMUNITY)
Admission: EM | Disposition: A | Discharge status: Skilled Nursing Facility | Payer: Self-pay | Source: Home / Self Care | Attending: Family Medicine

## 2018-04-25 DIAGNOSIS — N19 Unspecified kidney failure: Secondary | ICD-10-CM

## 2018-04-25 HISTORY — PX: ESOPHAGOGASTRODUODENOSCOPY (EGD) WITH PROPOFOL: SHX5813

## 2018-04-25 LAB — BASIC METABOLIC PANEL
Anion gap: 8 (ref 5–15)
BUN: 77 mg/dL — ABNORMAL HIGH (ref 8–23)
CO2: 15 mmol/L — AB (ref 22–32)
Calcium: 8.3 mg/dL — ABNORMAL LOW (ref 8.9–10.3)
Chloride: 113 mmol/L — ABNORMAL HIGH (ref 98–111)
Creatinine, Ser: 3.75 mg/dL — ABNORMAL HIGH (ref 0.44–1.00)
GFR calc Af Amer: 12 mL/min — ABNORMAL LOW (ref >60)
GFR calc non Af Amer: 11 mL/min — ABNORMAL LOW (ref >60)
GLUCOSE: 111 mg/dL — AB (ref 70–99)
Potassium: 3.6 mmol/L (ref 3.5–5.1)
Sodium: 136 mmol/L (ref 135–145)

## 2018-04-25 LAB — CBC
HCT: 20.9 % — ABNORMAL LOW (ref 36.0–46.0)
HCT: 22.8 % — ABNORMAL LOW (ref 36.0–46.0)
Hemoglobin: 6.8 g/dL — CL (ref 12.0–15.0)
Hemoglobin: 7.4 g/dL — ABNORMAL LOW (ref 12.0–15.0)
MCH: 28.3 pg (ref 26.0–34.0)
MCH: 28.2 pg (ref 26.0–34.0)
MCHC: 32.5 g/dL (ref 30.0–36.0)
MCHC: 32.5 g/dL (ref 30.0–36.0)
MCV: 87.1 fL (ref 80.0–100.0)
MCV: 87 fL (ref 80.0–100.0)
Platelets: 154 10*3/uL (ref 150–400)
Platelets: 163 10*3/uL (ref 150–400)
RBC: 2.4 MIL/uL — ABNORMAL LOW (ref 3.87–5.11)
RBC: 2.62 MIL/uL — ABNORMAL LOW (ref 3.87–5.11)
RDW: 19.4 % — ABNORMAL HIGH (ref 11.5–15.5)
RDW: 19.9 % — ABNORMAL HIGH (ref 11.5–15.5)
WBC: 4.3 10*3/uL (ref 4.0–10.5)
WBC: 5 10*3/uL (ref 4.0–10.5)
nRBC: 0 % (ref 0.0–0.2)
nRBC: 0 % (ref 0.0–0.2)

## 2018-04-25 LAB — BRAIN NATRIURETIC PEPTIDE: B Natriuretic Peptide: 1146.5 pg/mL — ABNORMAL HIGH (ref 0.0–100.0)

## 2018-04-25 LAB — FERRITIN: Ferritin: 76 ng/mL (ref 11–307)

## 2018-04-25 SURGERY — IMAGING PROCEDURE, GI TRACT, INTRALUMINAL, VIA CAPSULE

## 2018-04-25 SURGERY — ESOPHAGOGASTRODUODENOSCOPY (EGD) WITH PROPOFOL
Anesthesia: Monitor Anesthesia Care | Laterality: Left

## 2018-04-25 MED ORDER — PROPOFOL 500 MG/50ML IV EMUL
INTRAVENOUS | Status: DC | PRN
  Administered 2018-04-25: 50 ug/kg/min via INTRAVENOUS

## 2018-04-25 MED ORDER — LIDOCAINE 2% (20 MG/ML) 5 ML SYRINGE
INTRAMUSCULAR | Status: DC | PRN
  Administered 2018-04-25: 60 mg via INTRAVENOUS

## 2018-04-25 MED ORDER — HYDRALAZINE HCL 20 MG/ML IJ SOLN
5.0000 mg | INTRAMUSCULAR | Status: DC | PRN
  Administered 2018-04-25: 5 mg via INTRAVENOUS
  Filled 2018-04-25: qty 1

## 2018-04-25 MED ORDER — CLONIDINE HCL 0.2 MG PO TABS
0.2000 mg | ORAL_TABLET | Freq: Two times a day (BID) | ORAL | Status: DC
  Administered 2018-04-25 – 2018-05-01 (×12): 0.2 mg via ORAL
  Filled 2018-04-25 (×12): qty 1

## 2018-04-25 MED ORDER — PROPOFOL 10 MG/ML IV BOLUS
INTRAVENOUS | Status: DC | PRN
  Administered 2018-04-25 (×2): 10 mg via INTRAVENOUS

## 2018-04-25 SURGICAL SUPPLY — 15 items

## 2018-04-25 NOTE — Anesthesia Procedure Notes (Signed)
Date/Time: 04/25/2018 8:56 AM Performed by: Trinna Post., CRNA Pre-anesthesia Checklist: Patient identified, Emergency Drugs available, Suction available, Patient being monitored and Timeout performed Patient Re-evaluated:Patient Re-evaluated prior to induction Oxygen Delivery Method: Nasal cannula Preoxygenation: Pre-oxygenation with 100% oxygen Induction Type: IV induction Placement Confirmation: positive ETCO2

## 2018-04-25 NOTE — Progress Notes (Signed)
Patient's hemoglobin went up from 6.8 to 7.4 this morning following transfusion of 1 unit of packed cells.  Reviewed risks of endoscopy with patient, she is agreeable.  Also, we plan to do a capsule endoscopy following her EGD, unless the EGD shows an obvious source for her recurring anemia.  This would complete her GI work-up, since she previously had upper endoscopy and colonoscopy last October.  I have discussed the nature, purpose, and risks of capsule endoscopy with the patient and she is agreeable to that as well.  Cleotis Nipper, M.D. Pager [phone] If no answer or after 5 PM call [phone]

## 2018-04-25 NOTE — Anesthesia Postprocedure Evaluation (Signed)
Anesthesia Post Note  Patient: Jennifer Hinton  Procedure(s) Performed: ESOPHAGOGASTRODUODENOSCOPY (EGD) WITH PROPOFOL (Left )     Patient location during evaluation: PACU Anesthesia Type: MAC Level of consciousness: awake and alert Pain management: pain level controlled Vital Signs Assessment: post-procedure vital signs reviewed and stable Respiratory status: spontaneous breathing, nonlabored ventilation, respiratory function stable and patient connected to nasal cannula oxygen Cardiovascular status: stable and blood pressure returned to baseline Postop Assessment: no apparent nausea or vomiting Anesthetic complications: no    Last Vitals:  Vitals:   04/25/18 1500 04/25/18 1716  BP: (!) 156/82 (!) 175/85  Pulse: 86 86  Resp:  (!) 23  Temp:  37.1 C  SpO2:  98%    Last Pain:  Vitals:   04/25/18 1716  TempSrc: Oral  PainSc:                  Samvel Zinn

## 2018-04-25 NOTE — Consult Note (Addendum)
Renal Service Consult Note [facility]  Jennifer Hinton 04/25/2018 Sol [place]azing Requesting Physician:  Dr Andria Frames, Viona Gilmore.   Reason for Consult:  Acute / CRF , ^BP HPI: The patient is a 83 y.o. year-old with hx of perm afib, pulm HTN, RA, GIB, fe def anemia and CKD /AKI admitted for severe anemia Hb 4.5 from labs at [facility] office. Had EGD/ colon OCt 2019 w/o source of bleeding. Seen by GI and is on for endoscopy today. Chronic anticoag for afib.  Worsening renal failrue and uncont HTN, asked to see for this.    Patient denies any knowledge of hx of kidney failure/ damage, disease, etc.  Pt is poor historian. No voiding issues.  No SOB or cough or orthopnea.    Admit CXR read as CHF , old films however have IS pattern as well.  May be chronic.   Pt is widowed, has a friend in the room.  No smoking history.    Echart  05/2014 - admit for SOB, uncont HTN and new onset CHF. Echo showed pulm HTN, dilated LA/ RA, severe TR and EF 60-65%. Diuresed on IV lasix, dc on po 64m bid. Required mult BP meds at high doses.   Nov 2019 - admit for GIB , anemia , on coumadin for afib, hx pulm HTN /CHF. Creat 1.8. FOBT+, EGD and colon done w/o source of bleeding. 4u prbc, microcytic > po iron and got IV iron here.    ROS  denies CP  no joint pain   no HA  no blurry vision  no rash  no diarrhea  no nausea/ vomiting  no dysuria  no difficulty voiding  no change in urine color    Past Medical History  Past Medical History:  Diagnosis Date  . Anemia due to blood loss, acute 11/2017  . Atrial fibrillation, permanent   . Chronic diastolic CHF (congestive heart failure) ([place]   . Hyperlipidemia   . Hypertension   . Pulmonary hypertension ([place]   . RA (rheumatoid arthritis) ([place]    Past Surgical History  Past Surgical History:  Procedure Laterality Date  . COLONOSCOPY WITH PROPOFOL N/A 12/16/2017   Procedure: COLONOSCOPY WITH PROPOFOL;  Surgeon: GWonda Horner MD;  Location: [facility]  ENDOSCOPY;  Service: Endoscopy;  Laterality: N/A;  . ESOPHAGOGASTRODUODENOSCOPY (EGD) WITH PROPOFOL N/A 12/16/2017   Procedure: ESOPHAGOGASTRODUODENOSCOPY (EGD) WITH PROPOFOL;  Surgeon: GWonda Horner MD;  Location: [facility] ENDOSCOPY;  Service: Endoscopy;  Laterality: N/A;  . NO PAST SURGERIES    . POLYPECTOMY  12/16/2017   Procedure: POLYPECTOMY;  Surgeon: GWonda Horner MD;  [facility];  Service: Endoscopy;;   Family History  Family History  Problem Relation Age of Onset  . Heart disease Brother   . Hyperlipidemia Brother    Social History  reports that she has never smoked. She has never used smokeless tobacco. She reports that she does not drink alcohol or use drugs. Allergies No Known Allergies Home medications Prior to Admission medications   Medication Sig Start Date End Date Taking? Authorizing Provider  acetaminophen (TYLENOL) 650 MG CR tablet Take 650 mg by mouth as needed for pain (knee pain).    Yes [provider]  amLODipine (NORVASC) 10 MG tablet TAKE 1 TABLET BY MOUTH EVERY DAY Patient taking differently: Take 10 mg by mouth daily.  12/23/17  Yes CLelon Perla MD  carvedilol (COREG) 25 MG tablet TAKE 1 TABLET BY MOUTH TWICE A DAY WITH A MEAL. Patient  taking differently: Take 25 mg by mouth 2 (two) times daily with a meal.  03/18/18  Yes Meng, Hao, PA  ELIQUIS 2.5 MG TABS tablet TAKE 1 TABLET BY MOUTH TWICE A DAY Patient taking differently: Take 2.5 mg by mouth 2 (two) times daily.  02/25/18  Yes Diallo, Abdoulaye, MD  feeding supplement, ENSURE ENLIVE, (ENSURE ENLIVE) LIQD Take 237 mLs by mouth 2 (two) times daily between meals. Patient taking differently: Take 237 mLs by mouth daily.  12/19/17  Yes Debbe Odea, MD  ferrous sulfate 325 (65 FE) MG tablet Take 1 tablet (325 mg total) by mouth 2 (two) times daily with a meal. 12/17/17  Yes Rizwan, Eunice Blase, MD  furosemide (LASIX) 40 MG tablet Take 1 tablet (40 mg total) by mouth as needed for fluid (take if  you gain 3lbs in a day or 5lbs in a week.). Patient taking differently: Take 20-40 mg by mouth as needed for fluid (take if you gain 3lbs in a day or 5lbs in a week.).  02/03/18 05/04/18 Yes Barrett, Evelene Croon, PA-C  hydrALAZINE (APRESOLINE) 100 MG tablet TAKE 1 TABLET (100 MG TOTAL) BY MOUTH 3 (THREE) TIMES DAILY. 03/20/18  Yes Lelon Perla, MD  bisacodyl (DULCOLAX) 10 MG suppository Place 1 suppository (10 mg total) rectally daily as needed for moderate constipation. Patient not taking: Reported on 02/03/2018 12/19/17   Debbe Odea, MD  polyethylene glycol [facility]) packet Take 17 g by mouth daily as needed for mild constipation. Patient not taking: Reported on 02/03/2018 12/19/17   Debbe Odea, MD  senna (SENOKOT) 8.6 MG TABS tablet Take 1 tablet (8.6 mg total) by mouth at bedtime as needed for mild constipation. Patient not taking: Reported on 02/03/2018 12/19/17   Debbe Odea, MD   Liver Function Tests Recent Labs  Lab 04/23/18 1603  AST 28  ALT 8  ALKPHOS 39  BILITOT 0.6  PROT 7.5  ALBUMIN 2.5*   No results for input(s): LIPASE, AMYLASE in the last 168 hours. CBC Recent Labs  Lab 04/23/18 1603 04/24/18 0502 04/24/18 1200 04/24/18 1815 04/25/18 0432  WBC 3.9* 4.3  --   --  5.0  NEUTROABS 3.4  --   --   --   --   HGB 5.0* 6.8* 6.9* 8.1* 7.4*  HCT 16.1* 20.9* 21.2* 25.0* 22.8*  MCV 97.6 87.1  --   --  87.0  PLT 185 154  --   --  425   Basic Metabolic Panel Recent Labs  Lab 04/23/18 1603 04/24/18 0502 04/24/18 1815 04/25/18 0432  NA 133* 133* 134* 136  K 3.0* 3.3* 3.7 3.6  CL 106 108 110 113*  CO2 17* 14* 14* 15*  GLUCOSE 139* 94 145* 111*  BUN 82* 78* 79* 77*  CREATININE 3.66* 3.57* 3.80* 3.75*  CALCIUM 8.4* 8.3* 8.0* 8.3*   Iron/TIBC/Ferritin/ %Sat    Component Value Date/Time   IRON 19 (L) 04/23/2018 1705   TIBC 228 (L) 04/23/2018 1705   FERRITIN 51 04/23/2018 1705   IRONPCTSAT 8 (L) 04/23/2018 1705    Vitals:   04/25/18 0940 04/25/18 0949  04/25/18 0950 04/25/18 1025  BP: (!) 204/101  (!) 192/98 (!) 182/84  Pulse: 88  85 78  Resp: (!) 24  (!) 25 18  Temp:    98.3 F (36.8 C)  TempSrc:    Oral  SpO2: 94%  94% 97%  Weight:  59 kg    Height:  5' 4"  (1.626 m)     Exam  Gen frail elderly AAF, nasal O2, no distress, calm, cachectic No rash, cyanosis or gangrene Sclera anicteric, throat clear  No jvd or bruits, flat neck veins Chest clear bilat, no wheezing or rales, decreased BS bilat RRR no MRG Abd soft ntnd no mass or ascites +bs GU defer MS no joint effusions or deformity Ext no LE or UE or other edema, no wounds or ulcers Neuro is alert, Ox 3 , nf, gen'd weakness   Date   Creat  eGFR    CKD 2008 > 2015  0.8- 1.0  2016- 05/2017  1.2- 1.6 38- 54    III Oct- Dec/2019 1.7- 2.4 20- 31    IV  Apr 23, 2018  3.66  13    V  Apr 25, 2018  3.75  12    V   Home meds:  - amlodipine 10 / carvedilol 25 bid/ hydralazine 100 tid/ furosemide 20-40 qd prn  - eliquis 2.5 bid  - prn's / vitmains/ laxatives    UA oct 2019 > negative, no protein   UA 04/23/18 > clear prot 30, 21-50 rbc/ 0-5 wbc, rare bact   Renal US 2009 > 11.3/ 11.4 cm kidneys w/ Edison Pace bilat, no hydro      CXR 04/23/18 > Cardiomegaly with mild diffuse pulmonary interstitial edema and small left pleural effusion, suggesting CHF.    ECHO 05/2017 > LVEF 60-65%, mod LVH. Massive LAE and RAE, severe TR, mild pulm regurg, PA peak 2m, IVC dilated est CVP 848m  Assessment: 1. Renal failure - creat worsening over the last 4-5 years. Has severe HTN, pulm HTN.  Don't see any vol overload on exam, so not sure CXR is really CHF, she has some chronic IS changes on prior films.  Needs renal w/u and renal f/u.  Plan renal [place]/ serologies, UPC ratio. Does have microhematuria today. She does not have indication for dialysis at this time, but eGFR is c/w stage V renal failure unfortunately.  Will get noncon CT to eval lungs.  2. Uncont HTN - currently getting coreg/ norvasc/ hydralazine  as at home.  Will add clonidine.   3. Permanent atrial fib - on eliquis, on hold now for poss GIB 4. Recent admit for GIB 5. Hx CHF - normal LVEF 6. Pulm HTN -severe by last echo 7. H/o RA    Plan: 1. As above. Will follow.       [facility] 04/25/2018, 11:37 AM

## 2018-04-25 NOTE — Interval H&P Note (Signed)
History and Physical Interval Note:  04/25/2018 9:00 AM  Jennifer Hinton  has presented today for surgery, with the diagnosis of melena, anemia.  The various methods of treatment have been discussed with the patient. After consideration of risks, benefits and other options for treatment, the patient has consented to  Procedure(s): ESOPHAGOGASTRODUODENOSCOPY (EGD) WITH PROPOFOL (Left) as a surgical intervention.  The patient's history has been reviewed, patient examined, no change in status, stable for surgery.  I have reviewed the patient's chart and labs.  Questions were answered to the patient's satisfaction.     [name]

## 2018-04-25 NOTE — Progress Notes (Signed)
Family Medicine Teaching Service Daily Progress Note Intern Pager: [phone]  Patient name: Jennifer Hinton Medical record number: [id number] Date of birth: 05-05-1935 Age: 83 y.o. Gender: female  Primary Care Provider: [name], MD Consultants: GI Code Status: Full  Pt Overview and Major Events to Date:  3/5-admission for symptomatic anemia in the setting of likely lower GI bleed  Assessment and Plan: Jennifer Hinton is a 83 y.o. female presenting with symptomatic anemia 2/2 GI bleed. PMH is significant for HLD, gout, HTN, afib, diastolic CHF, pHTN, CKDIV.  We will transfuse and work-up for possible lower GI bleed.  Symptomatic anemia secondary to GI bleed Hemoglobin responded to transfusion yesterday with an increased to 8.1.  Followed by a downtrend to 7.4 on the morning CBC on 3/7.  We will plan for additional transfusion following upper GI scope this morning.  Upper GI study was ultimately unrevealing, pill endoscopy started in about 10 AM. -Transfuse 1 unit following upper GI scope -Transfusion threshold < 8 -Follow-up posttransfusion hemoglobin -Follow-up with GI after upper GI scope -Continue Protonix  AKI on CKD stage IV Admission creatinine 3.66 (baseline 1.5 - 1.7).  Creatinine 3.75 in the morning of 3/7 with a BUN of 77. -Monitor BMP -consult nephrology  Hypertensive Hypertensive up to 200/91 at 8 AM on 3/7.  Following measurement at 9:00 AM was 161/107.  The team was not notified of these elevated blood pressures.  The patient is now at her upper GI endoscopy.  We will follow-up with repeat blood pressure and consult to nephrology.  Suspicious for fluid overload with a physical exam remarkable for elevated JVD and rales on pulmonary auscultation. -start hydralazine 10 mg Q 4 hours PRN for sys > 180 and dia > 110 -Continue amlodipine 10 mg daily -Continue carvedilol 25 mg twice daily -Continue hydralazine 100 mg 3 times daily -Consult to  nephrology  Hypokalemia-improved Potassium 3.6 this morning, 3/7. -Continue to monitor -Replete PRN  Diastolic CHF with pulmonary hypertension -Continue Coreg -Holding home Lasix  A. fib Holding Eliquis in the setting of GI bleed. -Continue to hold Eliquis  Protein calorie malnutrition -Nutrition consult following colonoscopy  FEN/GI: NPO for colonoscopy PPx: SCDs  Disposition: Anticipate 1-2 additional hospital days for appropriate procedure and intervention.  Subjective:  Seen shortly after endoscopy this morning.  She remained slightly tired/sedated compared to previous encounters.  She reported no new complaints/symptoms this morning.  Objective: Temp:  [98 F (36.7 C)-98.7 F (37.1 C)] 98.7 F (37.1 C) (03/07 0501) Pulse Rate:  [65-77] 74 (03/07 0501) Resp:  [18-22] 18 (03/07 0501) BP: (121-143)/(59-78) 143/78 (03/07 0501) SpO2:  [92 %-97 %] 92 % (03/07 0501) Weight:  [57.6 kg] 57.6 kg (03/06 2046)  Physical Exam: General: Alert and cooperative though tired compared to previous encounters.  No acute distress. HEENT: Moist mucous membranes, JVD to angle of jaw Cardio: Regular rate and rhythm. Rhythm is regular.  2/6 systolic murmur.   Pulm: Breathing comfortably on 2 L nasal cannula.  Rales noted bilaterally at lung bases. Abdomen: Bowel sounds normal. Abdomen soft and non-tender.  Extremities: Trace lower extremity edema.  Warm/well perfused.  Strong radial pulse. Neuro: Cranial nerves grossly intact.    Laboratory: Recent Labs  Lab 04/23/18 1603 04/24/18 0502 04/24/18 1200 04/24/18 1815 04/25/18 0432  WBC 3.9* 4.3  --   --  5.0  HGB 5.0* 6.8* 6.9* 8.1* 7.4*  HCT 16.1* 20.9* 21.2* 25.0* 22.8*  PLT 185 154  --   --  163  Recent Labs  Lab 04/23/18 1603 04/24/18 0502 04/24/18 1815 04/25/18 0432  NA 133* 133* 134* 136  K 3.0* 3.3* 3.7 3.6  CL 106 108 110 113*  CO2 17* 14* 14* 15*  BUN 82* 78* 79* 77*  CREATININE 3.66* 3.57* 3.80* 3.75*   CALCIUM 8.4* 8.3* 8.0* 8.3*  PROT 7.5  --   --   --   BILITOT 0.6  --   --   --   ALKPHOS 39  --   --   --   ALT 8  --   --   --   AST 28  --   --   --   GLUCOSE 139* 94 145* 111*      Imaging/Diagnostic Tests: Dg Chest 1 View  Result Date: 04/23/2018 CLINICAL DATA:  Initial evaluation for acute shortness of breath. EXAM: CHEST  1 VIEW COMPARISON:  Prior radiograph from 12/11/2017. FINDINGS: Advanced cardiomegaly, similar to previous. Mediastinal silhouette within normal limits. Aortic atherosclerosis. Lungs normally inflated. Prominent perihilar vascular congestion with mild diffuse interstitial congestion. Probable left pleural effusion. No focal infiltrates. No pneumothorax. No acute osseous finding. IMPRESSION: 1. Cardiomegaly with mild diffuse pulmonary interstitial edema and small left pleural effusion, suggesting CHF. 2. Aortic atherosclerosis. Electronically Signed   By: Jeannine Boga M.D.   On: 04/23/2018 16:59     Matilde Haymaker, MD 04/25/2018, 6:52 AM PGY-1, [place] Intern pager: [phone], text pages welcome

## 2018-04-25 NOTE — Transfer of Care (Signed)
Immediate Anesthesia Transfer of Care Note  Patient: Jennifer Hinton  Procedure(s) Performed: ESOPHAGOGASTRODUODENOSCOPY (EGD) WITH PROPOFOL (Left )  Patient Location: PACU and Endoscopy Unit  Anesthesia Type:MAC  Level of Consciousness: awake and drowsy  Airway & Oxygen Therapy: Patient Spontanous Breathing  Post-op Assessment: Report given to RN and Post -op Vital signs reviewed and stable  Post vital signs: Reviewed and stable  Last Vitals:  Vitals Value Taken Time  BP    Temp    Pulse 79 04/25/2018  9:17 AM  Resp 28 04/25/2018  9:17 AM  SpO2 89 % 04/25/2018  9:17 AM  Vitals shown include unvalidated device data.  Last Pain:  Vitals:   04/25/18 0842  TempSrc: Oral  PainSc: 0-No pain         Complications: No apparent anesthesia complications

## 2018-04-25 NOTE — Progress Notes (Signed)
Upper endoscopy unrevealing for source of significant drop in hemoglobin.  Capsule will be placed in endoscopy unit.  Reading will hopefully be available either Sunday afternoon or Monday.    It would be ideal to hold off on patient's discharge until after the capsule reading is performed, to help guide decision at pertaining to resumption of anticoagulation.  Please call me at any time if you would like to discuss this patient's case.  Cleotis Nipper, M.D. Pager [phone] If no answer or after 5 PM call [phone]

## 2018-04-25 NOTE — Op Note (Signed)
[facility] Patient Name: Jennifer Hinton Procedure Date : 04/25/2018 MRN: [id number] Attending MD: Ronald Lobo , MD Date of Birth: 04-Jun-1935 CSN: [id number] Age: 83 Admit Type: Inpatient Procedure:                Upper GI endoscopy Indications:              Acute post hemorrhagic anemia--patient presented                            with weakness and shortness of breath, hgb 5.0,                            stool heme+ but without overt GI bleeding; similar                            presentation Oct. 2019 at which time egd/colon were                            unrevealing Providers:                Ronald Lobo, MD, Burtis Junes, RN, Laverda Sorenson,                            Technician, Dewitt Hoes, CRNA Referring MD:              Medicines:                Monitored Anesthesia Care Complications:            No immediate complications. Estimated Blood Loss:     Estimated blood loss: none. Procedure:                Pre-Anesthesia Assessment:                           - Prior to the procedure, a History and Physical                            was performed, and patient medications and                            allergies were reviewed. The patient's tolerance of                            previous anesthesia was also reviewed. The risks                            and benefits of the procedure and the sedation                            options and risks were discussed with the patient.                            All questions were answered, and informed consent  was obtained. Prior Anticoagulants: The patient has                            taken Eliquis (apixaban), last dose was 2 days                            prior to procedure. ASA Grade Assessment: IV - A                            patient with severe systemic disease that is a                            constant threat to life. After reviewing the risks                            and  benefits, the patient was deemed in                            satisfactory condition to undergo the procedure.                           After obtaining informed consent, the endoscope was                            passed under direct vision. Throughout the                            procedure, the patient's blood pressure, pulse, and                            oxygen saturations were monitored continuously. The                            GIF-H190 ([id number]) Olympus gastroscope was                            introduced through the mouth, and advanced to the                            second part of duodenum. The upper GI endoscopy was                            accomplished without difficulty. The patient                            tolerated the procedure well. Scope In: Scope Out: Findings:      The examined esophagus was normal.      A 2 cm hiatal hernia was present.      The entire examined stomach was normal.      There is no endoscopic evidence of bleeding, inflammation, ulceration,       angiodysplasia, mass or erosion in the entire examined stomach.      The cardia and gastric fundus were normal on retroflexion.  Patchy mildly erythematous and edematous mucosa without ulceration,       erosion, or active bleeding and with no stigmata of bleeding was found       in the second portion of the duodenum.      The exam of the duodenum was otherwise normal. Impression:               - No source of blood loss, heme positive stool, or                            recurring severe anemia evident on this exam.                           - Normal esophagus.                           - 2 cm hiatal hernia.                           - Normal stomach.                           - Erythematous duodenopathy.                           - No specimens collected. Recommendation:           - To visualize the small bowel, perform video                            capsule endoscopy today. Procedure  Code(s):        --- Professional ---                           [phone], Esophagogastroduodenoscopy, flexible,                            transoral; diagnostic, including collection of                            specimen(s) by brushing or washing, when performed                            (separate procedure) Diagnosis Code(s):        --- Professional ---                           D62, Acute posthemorrhagic anemia                           K31.89, Other diseases of stomach and duodenum CPT copyright 2018 American Medical Association. All rights reserved. The codes documented in this report are preliminary and upon coder review may  be revised to meet current compliance requirements. Ronald Lobo, MD 04/25/2018 9:27:10 AM This report has been signed electronically. Number of Addenda: 0

## 2018-04-25 NOTE — Anesthesia Preprocedure Evaluation (Signed)
Anesthesia Evaluation  Patient identified by MRN, date of birth, ID band Patient awake    Reviewed: Allergy & Precautions, NPO status , Patient's Chart, lab work & pertinent test results  History of Anesthesia Complications Negative for: history of anesthetic complications  Airway Mallampati: IV  TM Distance: >3 FB Neck ROM: Full  Mouth opening: Limited Mouth Opening  Dental  (+) Teeth Intact   Pulmonary shortness of breath,    breath sounds clear to auscultation       Cardiovascular hypertension, Pt. on medications +CHF   Rhythm:Irregular     Neuro/Psych negative neurological ROS  negative psych ROS   GI/Hepatic Neg liver ROS,   Endo/Other    Renal/GU ARF and CRFRenal disease     Musculoskeletal   Abdominal   Peds  Hematology  (+) Blood dyscrasia, anemia ,   Anesthesia Other Findings   Reproductive/Obstetrics                             Anesthesia Physical Anesthesia Plan  ASA: III  Anesthesia Plan: MAC   Post-op Pain Management:    Induction: Intravenous  PONV Risk Score and Plan: 2 and Treatment may vary due to age or medical condition and Propofol infusion  Airway Management Planned: Nasal Cannula  Additional Equipment: None  Intra-op Plan:   Post-operative Plan:   Informed Consent: I have reviewed the patients History and Physical, chart, labs and discussed the procedure including the risks, benefits and alternatives for the proposed anesthesia with the patient or authorized representative who has indicated his/her understanding and acceptance.     Dental advisory given  Plan Discussed with: CRNA and Surgeon  Anesthesia Plan Comments:         Anesthesia Quick Evaluation

## 2018-04-25 NOTE — Progress Notes (Signed)
Pt ingested pill cam at 0955. Education given to pt and Surveyor, mining. Pt tolerated procedure well.

## 2018-04-25 NOTE — Progress Notes (Addendum)
Family Medicine Teaching Service Daily Progress Note Intern Pager: [phone]  Patient name: Jennifer Hinton Medical record number: [id number] Date of birth: Jun 07, 1935 Age: 83 y.o. Gender: female  Primary Care Provider: [name], MD Consultants: GI, nephrology Code Status: Full  Pt Overview and Major Events to Date:  3/5 admitted for GI Bleed 3/7 EGD  Assessment and Plan: [name] a 83 y.o.female whopresented with symptomatic anemia 2/2 GI bleed. PMH is significant forHLD, gout, HTN, afib, diastolic CHF, pHTN, CKD IV.   Symptomatic anemia secondary to GI bleed. Hgb downtrending 8.1>7.4>6.9 this morning. Has received total of 3U RBC this hospital stay. Uncertain source as had recent colonoscopy 12/16/17 with single tubular adenoma polyp and EGD on 04/25/18 negative as well. -Monitor Hgb, Transfusion threshold < 8 -Will need another 1U RBC today after starting IV lasix -Video capsule study per GI, appreciate recommendations -Continue Protonix  AKI on CKD stage IV.  Cr 3.68 (baseline 1.5 - 1.7). Renal US showed increased echogenicity in R kidney, no hydronephrosis -Discussed with nephrology, start IV lasix 80 TID. Ideally would be beneficial to diurese before transfusing today - Will give Kdur 40 mEq x2 today with IV diuresis -Complement, UPC pending -Monitor BMP - avoid nephrotoxic medications  HFpEF exacerbation 2/2 fluid overload from multiple transfusions.  CT chest showing bilateral pleural effusions R>L c/w CHF. BPs improved today 120/56 but patient with increased WOB on 2L [place]. -Continue amlodipine 10mg  qd, coreg 25mg  BID, hydralazine 100mg  TID, clonidine 0.2mg  BID -nephrology following, appreciate recommendations. See above for IV diureses -hydralazine 5mg  Q4 hours PRN for SBP>180 and DBP>110  A. fib - Holding Eliquis in the setting of GI bleed.  Protein calorie malnutrition -Nutrition consult  FEN/GI: soft diet PPx: IV protonix  Disposition: pending GI  work up  Subjective:  Denies SOB, feels okay this morning. Overnight RN noticed this morning that patient is working harder to breathe at rest and even more significantly with getting up to bedside commode. No chest pain  Objective: Temp:  [98.3 F (36.8 C)-99.3 F (37.4 C)] 98.6 F (37 C) (03/08 0323) Pulse Rate:  [72-91] 80 (03/08 0323) Resp:  [18-28] 22 (03/08 0323) BP: (137-204)/(65-107) 137/77 (03/08 0323) SpO2:  [92 %-98 %] 92 % (03/08 0323) Weight:  [58.4 kg-59 kg] 58.4 kg (03/07 1947) Physical Exam: General: sitting up in bed, in NAD Cardiovascular: regular, S1 and S2. Systolic murmur Respiratory: Increased work of breathing with accessory muscle use on 2L [place]. Rales at bilateral bases. No wheezing Abdomen: soft, nontender, nondistended, + bowel sounds Extremities: no LE edema   Laboratory: Recent Labs  Lab 04/24/18 0502  04/24/18 1815 04/25/18 0432 04/26/18 0455  WBC 4.3  --   --  5.0 4.5  HGB 6.8*   < > 8.1* 7.4* 6.9*  HCT 20.9*   < > 25.0* 22.8* 21.5*  PLT 154  --   --  163 147*   < > = values in this interval not displayed.   Recent Labs  Lab 04/23/18 1603  04/24/18 1815 04/25/18 0432 04/26/18 0455  NA 133*   < > 134* 136 137  K 3.0*   < > 3.7 3.6 3.4*  CL 106   < > 110 113* 112*  CO2 17*   < > 14* 15* 17*  BUN 82*   < > 79* 77* 73*  CREATININE 3.66*   < > 3.80* 3.75* 3.68*  CALCIUM 8.4*   < > 8.0* 8.3* 8.1*  PROT 7.5  --   --   --   --  BILITOT 0.6  --   --   --   --   ALKPHOS 39  --   --   --   --   ALT 8  --   --   --   --   AST 28  --   --   --   --   GLUCOSE 139*   < > 145* 111* 95   < > = values in this interval not displayed.     Imaging/Diagnostic Tests: Ct Chest Wo Contrast  Addendum Date: 04/25/2018   ADDENDUM REPORT: 04/25/2018 20:00 ADDENDUM: ADDENDED IMPRESSION: 7. Metallic foreign body in the midthoracic esophagus of uncertain etiology, new since 04/23/2018 chest radiograph. These results will be called to the ordering clinician or  representative by the Radiologist Assistant, and communication documented in the PACS or zVision Dashboard. Electronically Signed   By: Ilona Sorrel M.D.   On: 04/25/2018 20:00   Result Date: 04/25/2018 CLINICAL DATA:  Inpatient. CHF. Clinical concern for interstitial lung disease. EXAM: CT CHEST WITHOUT CONTRAST TECHNIQUE: Multidetector CT imaging of the chest was performed following the standard protocol without IV contrast. COMPARISON:  04/23/2018 chest radiograph. FINDINGS: Cardiovascular: Moderate cardiomegaly. Trace pericardial effusion/thickening. Three-vessel coronary atherosclerosis. Atherosclerotic thoracic aorta with ectatic 4.2 cm ascending thoracic aorta. Dilated main pulmonary artery (3.5 cm diameter). Mediastinum/Nodes: No discrete thyroid nodules. There is a 1.8 x 1.3 x 1.2 cm metallic foreign body in the midthoracic esophagus just below the level of the carina (series 3/image 71), which is new since 04/23/2018 chest radiograph. Esophagus otherwise appears unremarkable. No axillary adenopathy. Mild right paratracheal adenopathy up to 1.2 cm (series 3/image 43). No discrete hilar adenopathy on this limited noncontrast scan. Lungs/Pleura: No pneumothorax. Small dependent bilateral pleural effusions, right greater than left. Passive atelectasis in the dependent lower lobes bilaterally. Diffuse interlobular septal thickening and extensive patchy ground-glass opacity throughout both lungs. No lung masses. No significant regions of subpleural reticulation, traction bronchiectasis, architectural distortion or frank honeycombing. Upper abdomen: No acute abnormality. Musculoskeletal: No aggressive appearing focal osseous lesions. Moderate thoracic spondylosis. IMPRESSION: 1. Moderate cardiomegaly. Small dependent bilateral pleural effusions, right greater than left. Diffuse interlobular septal thickening and extensive patchy ground-glass opacity in both lungs. This spectrum of findings is most compatible  with congestive heart failure. 2. No bronchiectasis, honeycombing or other findings to suggest interstitial lung disease. 3. Dilated main pulmonary artery, suggesting pulmonary arterial hypertension. 4. Three-vessel coronary atherosclerosis. 5. Ectatic 4.2 cm ascending thoracic aorta. Recommend annual imaging followup by CTA or MRA. This recommendation follows 2010 ACCF/AHA/AATS/ACR/ASA/SCA/SCAI/SIR/STS/SVM Guidelines for the Diagnosis and Management of Patients with Thoracic Aortic Disease. Circulation. 2010; 121: Z610-R604. Aortic aneurysm NOS (ICD10-I71.9) 6. Nonspecific mild right paratracheal adenopathy, potentially reactive. No follow-up is required unless as otherwise clinically warranted. This recommendation follows ACR consensus guidelines: Managing Incidental Findings on Thoracic CT: Mediastinal and Cardiovascular Findings. A White Paper of the ACR Incidental Findings Committee. J Am Coll Radiol. 2018; 15: 5409-8119. Aortic Atherosclerosis (ICD10-I70.0). Electronically Signed: By: Ilona Sorrel M.D. On: 04/25/2018 20:02   US Renal  Result Date: 04/25/2018 CLINICAL DATA:  Renal failure. EXAM: RENAL / URINARY TRACT ULTRASOUND COMPLETE COMPARISON:  01/20/2008 FINDINGS: Right Kidney: Renal measurements: 12.0 x 5.7 x 6.1 cm = volume: 220 mL. Right kidney is diffusely echogenic with poor definition of the internal architecture. Negative for right hydronephrosis. Very hypoechoic structure in the right kidney upper pole measures 0.9 cm and compatible with a small cyst. Anechoic structure in the right kidney lower pole measures  0.9 cm and compatible with a cyst. There is ascites in the right upper quadrant of the abdomen. Left Kidney: Renal measurements: 6.5 x 3.4 x 3.9 cm = volume: 44 mL. Limited evaluation of the left kidney. Left kidney appears to be atrophic without hydronephrosis. Bladder: Not visualized. Other: Ascites.  Bilateral pleural effusions. IMPRESSION: 1. Negative for hydronephrosis. 2. Increased  echogenicity in the right kidney could be associated with chronic medical renal disease. Left kidney appears to be atrophic. 3. Small amount of upper abdominal ascites. Bilateral pleural effusions. 4. Small right renal cysts. Electronically Signed   By: Markus Daft M.D.   On: 04/25/2018 15:43    Bufford Lope, DO 04/26/2018, 7:07 AM PGY-3, [place] [facility] Intern pager: [phone], text pages welcome

## 2018-04-26 ENCOUNTER — Encounter (HOSPITAL_COMMUNITY): Payer: Self-pay | Admitting: Gastroenterology

## 2018-04-26 LAB — HEPATITIS PANEL, ACUTE
HCV Ab: 0.2 s/co ratio (ref 0.0–0.9)
HEP B S AG: NEGATIVE
Hep A IgM: NEGATIVE
Hep B C IgM: NEGATIVE

## 2018-04-26 LAB — IRON AND TIBC
Iron: 14 ug/dL — ABNORMAL LOW (ref 28–170)
Saturation Ratios: 7 % — ABNORMAL LOW (ref 10.4–31.8)
TIBC: 209 ug/dL — ABNORMAL LOW (ref 250–450)
UIBC: 195 ug/dL

## 2018-04-26 LAB — MPO/PR-3 (ANCA) ANTIBODIES
ANCA Proteinase 3: 5.1 U/mL — ABNORMAL HIGH (ref 0.0–3.5)
Myeloperoxidase Abs: 47.2 U/mL — ABNORMAL HIGH (ref 0.0–9.0)

## 2018-04-26 LAB — PREPARE RBC (CROSSMATCH)

## 2018-04-26 LAB — CBC
HCT: 21.5 % — ABNORMAL LOW (ref 36.0–46.0)
HEMOGLOBIN: 6.9 g/dL — AB (ref 12.0–15.0)
MCH: 28.4 pg (ref 26.0–34.0)
MCHC: 32.1 g/dL (ref 30.0–36.0)
MCV: 88.5 fL (ref 80.0–100.0)
Platelets: 147 10*3/uL — ABNORMAL LOW (ref 150–400)
RBC: 2.43 MIL/uL — ABNORMAL LOW (ref 3.87–5.11)
RDW: 19.5 % — ABNORMAL HIGH (ref 11.5–15.5)
WBC: 4.5 10*3/uL (ref 4.0–10.5)
nRBC: 0 % (ref 0.0–0.2)

## 2018-04-26 LAB — PROTEIN / CREATININE RATIO, URINE
Creatinine, Urine: 54.39 mg/dL
Protein Creatinine Ratio: 1.54 mg/mg{Cre} — ABNORMAL HIGH (ref 0.00–0.15)
Total Protein, Urine: 84 mg/dL

## 2018-04-26 LAB — BASIC METABOLIC PANEL
ANION GAP: 8 (ref 5–15)
BUN: 73 mg/dL — ABNORMAL HIGH (ref 8–23)
CO2: 17 mmol/L — ABNORMAL LOW (ref 22–32)
Calcium: 8.1 mg/dL — ABNORMAL LOW (ref 8.9–10.3)
Chloride: 112 mmol/L — ABNORMAL HIGH (ref 98–111)
Creatinine, Ser: 3.68 mg/dL — ABNORMAL HIGH (ref 0.44–1.00)
GFR, EST AFRICAN AMERICAN: 13 mL/min — AB (ref >60)
GFR, EST NON AFRICAN AMERICAN: 11 mL/min — AB (ref >60)
Glucose, Bld: 95 mg/dL (ref 70–99)
Potassium: 3.4 mmol/L — ABNORMAL LOW (ref 3.5–5.1)
Sodium: 137 mmol/L (ref 135–145)

## 2018-04-26 MED ORDER — FUROSEMIDE 10 MG/ML IJ SOLN
80.0000 mg | Freq: Three times a day (TID) | INTRAMUSCULAR | Status: DC
  Administered 2018-04-26 – 2018-04-29 (×9): 80 mg via INTRAVENOUS
  Filled 2018-04-26 (×9): qty 8

## 2018-04-26 MED ORDER — POTASSIUM CHLORIDE CRYS ER 20 MEQ PO TBCR
40.0000 meq | EXTENDED_RELEASE_TABLET | Freq: Two times a day (BID) | ORAL | Status: AC
  Administered 2018-04-26 (×2): 40 meq via ORAL
  Filled 2018-04-26 (×2): qty 2

## 2018-04-26 MED ORDER — SODIUM CHLORIDE 0.9% IV SOLUTION: Freq: Once | INTRAVENOUS | Status: DC

## 2018-04-26 NOTE — Progress Notes (Signed)
Capsule study unsuccessful:  Even though no esoph stricture was seen on her egd, the pill cam never exited her esophagus (this accounts for the "metallic density" noted on her chest CT yesterday evening).  Interestingly, the patient denies any h/o dysphagia sx.  Pt denies sense of something stuck in her esophagus and indicates that she is eating solid food without difficulty (including eggs, bacon, and oatmeal this morning) so the pill cam has probably passed successfully into the stomach by now.  Meanwhile, pt's hgb has dropped again overnight (from 7.4 to 6.9) so I have recommended that we repeat the Pill Cam study tomorrow, using endoscopic placement into the duodenum.  Pt tentatively agreeable, but will let me know via nurse if she decides to cancel it.  Cleotis Nipper, M.D. Pager [phone] If no answer or after 5 PM call [phone]

## 2018-04-26 NOTE — Progress Notes (Signed)
   04/26/18 0533  Provider Notification  Provider Name/Title Family Medicine  Date Provider Notified 04/26/18  Time Provider Notified 0533  Notification Type Page  Notification Reason Change in status (Hgb 6.9)  Response Other (Comment) (MD made aware - no new orders @ this time)  Date of Provider Response 04/26/18  Time of Provider Response 0547   Patient's hemoglobin is 6.9.  MD notified at 34.  Orders wrote to transfuse one unit PRBC at 0701.  Will implement orders and continue to monitor patient.  Earleen Reaper RN

## 2018-04-26 NOTE — Progress Notes (Addendum)
[facility] Progress Note  Subjective: became more dyspneic overnight. CT chest showed diffuse ground glass c/w edema, no PNA.   Vitals:   04/25/18 1716 04/25/18 1947 04/26/18 0323 04/26/18 0852  BP: (!) 175/85 (!) 144/65 137/77 (!) 120/56  Pulse: 86 78 80 77  Resp: (!) 23 20 (!) 22 (!) 22  Temp: 98.7 F (37.1 C) 99.3 F (37.4 C) 98.6 F (37 C) 98.5 F (36.9 C)  TempSrc: Oral Oral Oral Oral  SpO2: 98% 97% 92% 97%  Weight:  58.4 kg    Height:        Inpatient medications: . sodium chloride   Intravenous Once  . amLODipine  10 mg Oral Daily  . carvedilol  25 mg Oral BID WC  . cloNIDine  0.2 mg Oral BID  . feeding supplement (ENSURE ENLIVE)  237 mL Oral BID BM  . furosemide  80 mg Intravenous Q8H  . hydrALAZINE  100 mg Oral TID  . pantoprazole (PROTONIX) IV  40 mg Intravenous Q12H  . potassium chloride  40 mEq Oral BID    acetaminophen **OR** acetaminophen, hydrALAZINE  Iron/TIBC/Ferritin/ %Sat    Component Value Date/Time   IRON 14 (L) 04/26/2018 0455   TIBC 209 (L) 04/26/2018 0455   FERRITIN 76 04/25/2018 1403   IRONPCTSAT 7 (L) 04/26/2018 0455    Exam: Gen frail elderly AAF, nasal O2, cachectic, more SOB today No jvd or bruits, flat neck veins Chest basilar rales bilat, no wheezing, ^wob RRR no MRG Abd soft ntnd no mass or ascites +bs Ext no LE or UE or other edema, no wounds or ulcers, cachectic Neuro is alert, Ox 3 , nf, gen'd weakness   Date                           Creat               eGFR  CKD  2008 > 2015                0.8- 1.0             2016- 05/2017               1.2- 1.6            38- 54     III Oct- Dec/2019  1.7- 2.4            20- 31     IV       Apr 23, 2018                 3.66                 13           V        Apr 25, 2018                 3.75                 12           V   Home meds:  - amlodipine 10 / carvedilol 25 bid/ hydralazine 100 tid/ furosemide 20-40 qd prn  - eliquis 2.5 bid  - prn's / vitmains/  laxatives    UA oct 2019 > negative, no protein   UA 04/23/18 > clear prot 30, 21-50 rbc/ 0-5 wbc, rare bact   Renal US 2009 > 11.3/ 11.4  cm kidneys w/ [place] Pace bilat, no hydro   Renal US 04/25/18 > 12.0 cm R/ 6.5 cm L.  Left kidney atrophic w/o hydro. No hydro on R. R diffusely echogenic.       CXR 04/23/18 > Cardiomegaly with mild diffuse pulmonary interstitial edema and small left pleural effusion, suggesting CHF.    ECHO 05/2017 > LVEF 60-65%, mod LVH. Massive LAE and RAE, severe TR, mild pulm regurg, PA peak 5m, IVC dilated est CVP 847m  Assessment: 1. Renal failure - creat worsening over the last 4-5 years. Has atrophic L kidney which is new compared to 2009 renal [place] either side. Could have RAS w/ hx of severe HTN.  SOB worse and +CHF per CT. Starting IV lasix 8028mid and ^ as needed. No indication for HD yet.  Serologies pending. 2. SOB/ pulm edema - vol overload prob cause, as above  3. Uncont HTN - cont BP meds x 4 here. BP's down today 4. Anemia ckd - h/o recent GIB.  Would try to hold off on PRBC's for now, could push vol status over the edge.  Diurese first.   5. Hx severe pulm HTN  6. Permanent atrial fib - on eliquis, on hold now for poss GIB 7. Recent admit for GIB 8. H/o RA     [facility] 04/26/2018, 12:30 PM  Recent Labs  Lab 04/23/18 1603  04/25/18 0432 04/26/18 0455  NA 133*   < > 136 137  K 3.0*   < > 3.6 3.4*  CL 106   < > 113* 112*  CO2 17*   < > 15* 17*  GLUCOSE 139*   < > 111* 95  BUN 82*   < > 77* 73*  CREATININE 3.66*   < > 3.75* 3.68*  CALCIUM 8.4*   < > 8.3* 8.1*  ALBUMIN 2.5*  --   --   --   INR 1.5*  --   --   --    < > = values in this interval not displayed.   Recent Labs  Lab 04/23/18 1603  AST 28  ALT 8  ALKPHOS 39  BILITOT 0.6  PROT 7.5   Recent Labs  Lab 04/23/18 1603  04/25/18 0432 04/26/18 0455  WBC 3.9*   < > 5.0 4.5  NEUTROABS 3.4  --   --   --   HGB 5.0*   < > 7.4* 6.9*  HCT 16.1*   < >  22.8* 21.5*  MCV 97.6   < > 87.0 88.5  PLT 185   < > 163 147*   < > = values in this interval not displayed.

## 2018-04-27 ENCOUNTER — Inpatient Hospital Stay (HOSPITAL_COMMUNITY): Payer: Medicare Other | Admitting: Certified Registered"

## 2018-04-27 ENCOUNTER — Inpatient Hospital Stay (HOSPITAL_COMMUNITY): Payer: Medicare Other

## 2018-04-27 ENCOUNTER — Encounter (HOSPITAL_COMMUNITY): Payer: Self-pay | Admitting: Certified Registered"

## 2018-04-27 ENCOUNTER — Encounter (HOSPITAL_COMMUNITY)
Admission: EM | Disposition: A | Discharge status: Skilled Nursing Facility | Payer: Self-pay | Source: Home / Self Care | Attending: Family Medicine

## 2018-04-27 DIAGNOSIS — E43 Unspecified severe protein-calorie malnutrition: Secondary | ICD-10-CM

## 2018-04-27 HISTORY — PX: GIVENS CAPSULE STUDY: SHX5432

## 2018-04-27 HISTORY — PX: ESOPHAGOGASTRODUODENOSCOPY (EGD) WITH PROPOFOL: SHX5813

## 2018-04-27 LAB — BPAM RBC
Blood Product Expiration Date: 202003252359
Blood Product Expiration Date: 202003312359
Blood Product Expiration Date: 202003312359
Blood Product Expiration Date: 202003312359
Blood Product Expiration Date: 202003312359
Blood Product Expiration Date: 202004012359
ISSUE DATE / TIME: 202003051937
ISSUE DATE / TIME: 202003052318
ISSUE DATE / TIME: 202003061444
ISSUE DATE / TIME: 202003082051
ISSUE DATE / TIME: 202003082357
Unit Type and Rh: 6200
Unit Type and Rh: 6200
Unit Type and Rh: 6200
Unit Type and Rh: 6200
Unit Type and Rh: 6200
Unit Type and Rh: 6200

## 2018-04-27 LAB — TYPE AND SCREEN
ABO/RH(D): A POS
Antibody Screen: NEGATIVE
UNIT DIVISION: 0
Unit division: 0
Unit division: 0
Unit division: 0
Unit division: 0
Unit division: 0

## 2018-04-27 LAB — BASIC METABOLIC PANEL
ANION GAP: 7 (ref 5–15)
BUN: 76 mg/dL — ABNORMAL HIGH (ref 8–23)
CO2: 16 mmol/L — AB (ref 22–32)
Calcium: 8 mg/dL — ABNORMAL LOW (ref 8.9–10.3)
Chloride: 112 mmol/L — ABNORMAL HIGH (ref 98–111)
Creatinine, Ser: 3.81 mg/dL — ABNORMAL HIGH (ref 0.44–1.00)
GFR calc Af Amer: 12 mL/min — ABNORMAL LOW (ref >60)
GFR calc non Af Amer: 11 mL/min — ABNORMAL LOW (ref >60)
Glucose, Bld: 113 mg/dL — ABNORMAL HIGH (ref 70–99)
Potassium: 4 mmol/L (ref 3.5–5.1)
Sodium: 135 mmol/L (ref 135–145)

## 2018-04-27 LAB — CBC
HCT: 25.4 % — ABNORMAL LOW (ref 36.0–46.0)
HEMOGLOBIN: 8.1 g/dL — AB (ref 12.0–15.0)
MCH: 27.8 pg (ref 26.0–34.0)
MCHC: 31.9 g/dL (ref 30.0–36.0)
MCV: 87.3 fL (ref 80.0–100.0)
Platelets: 136 10*3/uL — ABNORMAL LOW (ref 150–400)
RBC: 2.91 MIL/uL — AB (ref 3.87–5.11)
RDW: 19 % — ABNORMAL HIGH (ref 11.5–15.5)
WBC: 4.2 10*3/uL (ref 4.0–10.5)
nRBC: 0 % (ref 0.0–0.2)

## 2018-04-27 LAB — ANTINUCLEAR ANTIBODIES, IFA: ANA Ab, IFA: NEGATIVE

## 2018-04-27 LAB — C3 COMPLEMENT: C3 COMPLEMENT: 57 mg/dL — AB (ref 82–167)

## 2018-04-27 LAB — ANTI-DNA ANTIBODY, DOUBLE-STRANDED: ds DNA Ab: 10 IU/mL — ABNORMAL HIGH (ref 0–9)

## 2018-04-27 LAB — C4 COMPLEMENT: Complement C4, Body Fluid: 11 mg/dL — ABNORMAL LOW (ref 14–44)

## 2018-04-27 SURGERY — ESOPHAGOGASTRODUODENOSCOPY (EGD) WITH PROPOFOL
Anesthesia: Monitor Anesthesia Care

## 2018-04-27 MED ORDER — ENSURE ENLIVE PO LIQD
237.0000 mL | Freq: Three times a day (TID) | ORAL | Status: DC
  Administered 2018-04-28 – 2018-05-07 (×22): 237 mL via ORAL

## 2018-04-27 MED ORDER — PROPOFOL 10 MG/ML IV BOLUS
INTRAVENOUS | Status: DC | PRN
  Administered 2018-04-27: 20 mg via INTRAVENOUS

## 2018-04-27 MED ORDER — RENA-VITE PO TABS
1.0000 | ORAL_TABLET | Freq: Every day | ORAL | Status: DC
  Administered 2018-04-27 – 2018-05-07 (×11): 1 via ORAL
  Filled 2018-04-27 (×11): qty 1

## 2018-04-27 MED ORDER — SODIUM CHLORIDE 0.9 % IV SOLN
INTRAVENOUS | Status: DC
  Administered 2018-04-27: 14:00:00 via INTRAVENOUS

## 2018-04-27 MED ORDER — PROPOFOL 500 MG/50ML IV EMUL
INTRAVENOUS | Status: DC | PRN
  Administered 2018-04-27: 100 ug/kg/min via INTRAVENOUS

## 2018-04-27 MED ORDER — ADULT MULTIVITAMIN W/MINERALS CH: 1.0000 | ORAL_TABLET | Freq: Every day | ORAL | Status: DC

## 2018-04-27 SURGICAL SUPPLY — 16 items
BLOCK BITE 60FR ADLT L/F BLUE (MISCELLANEOUS) ×3 IMPLANT
ELECT REM PT RETURN 9FT ADLT (ELECTROSURGICAL)
ELECTRODE REM PT RTRN 9FT ADLT (ELECTROSURGICAL) IMPLANT
FORCEP RJ3 GP 1.8X160 W-NEEDLE (CUTTING FORCEPS) IMPLANT
FORCEPS BIOP RAD 4 LRG CAP 4 (CUTTING FORCEPS) IMPLANT
NDL SCLEROTHERAPY 25GX240 (NEEDLE) IMPLANT
NEEDLE SCLEROTHERAPY 25GX240 (NEEDLE) IMPLANT
PROBE APC STR FIRE (PROBE) IMPLANT
PROBE INJECTION GOLD (MISCELLANEOUS)
PROBE INJECTION GOLD 7FR (MISCELLANEOUS) IMPLANT
SNARE SHORT THROW 13M SML OVAL (MISCELLANEOUS) IMPLANT
SYR 50ML LL SCALE MARK (SYRINGE) IMPLANT
TOWEL COTTON PACK 4EA (MISCELLANEOUS) ×6 IMPLANT
TUBING ENDO SMARTCAP PENTAX (MISCELLANEOUS) ×6 IMPLANT
TUBING IRRIGATION ENDOGATOR (MISCELLANEOUS) ×3 IMPLANT
WATER STERILE IRR 1000ML POUR (IV SOLUTION) IMPLANT

## 2018-04-27 NOTE — Transfer of Care (Signed)
Immediate Anesthesia Transfer of Care Note  Patient: Jennifer Hinton  Procedure(s) Performed: ESOPHAGOGASTRODUODENOSCOPY (EGD) WITH PROPOFOL (N/A ) GIVENS CAPSULE STUDY (N/A )  Patient Location: Endoscopy Unit  Anesthesia Type:MAC  Level of Consciousness: drowsy and patient cooperative  Airway & Oxygen Therapy: Patient Spontanous Breathing and Patient connected to nasal cannula oxygen  Post-op Assessment: Report given to RN, Post -op Vital signs reviewed and stable and Patient moving all extremities  Post vital signs: Reviewed and stable  Last Vitals:  Vitals Value Taken Time  BP    Temp    Pulse    Resp    SpO2      Last Pain:  Vitals:   04/27/18 1330  TempSrc: Oral  PainSc: 0-No pain         Complications: No apparent anesthesia complications

## 2018-04-27 NOTE — Progress Notes (Signed)
Jennifer Hinton 1:42 PM  Subjective: Patient without any pain in her hospital computer chart reviewed and her case discussed with my partner Dr. Cristina Gong as well as her daughter and she has no new complaints  Objective: Vital signs stable afebrile exam please see preassessment evaluation labs stable  Assessment: Chronic anemia GI blood loss  Plan: Okay to proceed with endoscopy and place capsule into small bowel to continue work-up as requested by Dr. Cristina Gong  [facility] E  Pager [phone] After 5PM or if no answer call [phone]

## 2018-04-27 NOTE — Progress Notes (Signed)
Family Medicine Teaching Service Daily Progress Note Intern Pager: [phone]  Patient name: Jennifer Hinton Medical record number: [id number] Date of birth: Dec 16, 1935 Age: 83 y.o. Gender: female  Primary Care Provider: [name], MD Consultants: GI Code Status: Full code  Pt Overview and Major Events to Date:  Hospital Day 4 Admitted: 04/23/2018   Assessment and Plan: Kimara Bencomo a 83 y.o.female whopresented with symptomatic anemia 2/2 GI bleed. PMH is significant forHLD, gout, HTN, afib, diastolic CHF, pHTN, CKD IV.   Symptomatic anemia secondary to GI bleed. Hgb continues to downtrend after transfusions. Was given another unit pRBC last night w/ post transfusion h/h 8.1. .  Has received total of 4 U RBC this hospital stay. Uncertain source as had recent colonoscopy 12/16/17 with single tubular adenoma polyp and EGD on 04/25/18 negative as well. -Monitor Hgb, Transfusion threshold < 8 -repeat Video capsule study per GI, appreciate recommendations -Continue Protonix -f/u CT abd/pelvis  AKI on CKD stage IV.  Cr 3.68 (baseline 1.5 - 1.7). Renal US showed increased echogenicity in R kidney, no hydronephrosis -Discussed with nephrology, start IV lasix 80 TID. Ideally would be beneficial to diurese before transfusing today -Complement, UPC pending -MonitorBMP - avoid nephrotoxic medications  Hypertension - 135/77 today - amlodipine 10 - carvedilol 25 BID -clonidine 0.2 BID - hydralazine 100 TID -hydralazine 5mg  Q4 hours PRN for SBP>180 and DBP>110  HFpEF exacerbation 2/2 fluid overload from multiple transfusions. CT chest showing bilateral pleural effusions R>L c/w CHF. BPs improved today 120/56 but patient with increased WOB on 2L [place]. UOP 500 on 3/8.  - furosemide 80mg  IV TID -Continue amlodipine 10mg  qd, coreg 25mg  BID, hydralazine 100mg  TID, clonidine 0.2mg  BID -nephrology following, appreciate recommendations. See above for IV diureses  A. fib - Holding Eliquis  in the setting of GI bleed. - continue carvedilol  Protein calorie malnutrition -Nutrition consult  FEN/GI: soft diet PPx: IV protonix  Disposition: pending GI work up   Medications: Scheduled Meds: . sodium chloride   Intravenous Once  . sodium chloride   Intravenous Once  . amLODipine  10 mg Oral Daily  . carvedilol  25 mg Oral BID WC  . cloNIDine  0.2 mg Oral BID  . feeding supplement (ENSURE ENLIVE)  237 mL Oral BID BM  . furosemide  80 mg Intravenous Q8H  . hydrALAZINE  100 mg Oral TID  . pantoprazole (PROTONIX) IV  40 mg Intravenous Q12H   Continuous Infusions: PRN Meds: acetaminophen **OR** acetaminophen, hydrALAZINE  ================================================= ================================================= Subjective:  Patient feels fine today. No dizziness or fatigue, but she is not getting up out of bed very often.  She says she has a 'bad taste' in her mouth and would really like some water.   Objective: Temp:  [97.3 F (36.3 C)-98.7 F (37.1 C)] 97.3 F (36.3 C) (03/09 0337) Pulse Rate:  [60-77] 68 (03/09 0337) Resp:  [14-23] 18 (03/09 0337) BP: (103-141)/(48-72) 117/72 (03/09 0337) SpO2:  [97 %-100 %] 99 % (03/09 0337) Weight:  [56.8 kg] 56.8 kg (03/08 2039) Intake/Output 03/08 0701 - 03/09 0700 In: 720 [P.O.:530; Blood:190] Out: 500 [Urine:500] Physical Exam:  Gen: NAD, alert, non-toxic, well-nourished, well-appearing, sitting comfortably  CV: Regular rate and rhythm.  Normal capillary refill bilaterally.  Radial pulses 2+ bilaterally. No bilateral lower extremity edema. Resp: Clear to auscultation bilaterally.  No wheezing, rales, abnormal lung sounds.  No increased work of breathing appreciated. Abd: Nontender and nondistended on palpation to all 4 quadrants.  Positive bowel sounds. Psych: Cooperative  with exam. Pleasant. Makes eye contact.   Laboratory: Recent Labs  Lab 04/24/18 0502  04/24/18 1815 04/25/18 0432 04/26/18 0455   WBC 4.3  --   --  5.0 4.5  HGB 6.8*   < > 8.1* 7.4* 6.9*  HCT 20.9*   < > 25.0* 22.8* 21.5*  PLT 154  --   --  163 147*   < > = values in this interval not displayed.   Recent Labs  Lab 04/23/18 1603  04/24/18 1815 04/25/18 0432 04/26/18 0455  NA 133*   < > 134* 136 137  K 3.0*   < > 3.7 3.6 3.4*  CL 106   < > 110 113* 112*  CO2 17*   < > 14* 15* 17*  BUN 82*   < > 79* 77* 73*  CREATININE 3.66*   < > 3.80* 3.75* 3.68*  CALCIUM 8.4*   < > 8.0* 8.3* 8.1*  PROT 7.5  --   --   --   --   BILITOT 0.6  --   --   --   --   ALKPHOS 39  --   --   --   --   ALT 8  --   --   --   --   AST 28  --   --   --   --   GLUCOSE 139*   < > 145* 111* 95   < > = values in this interval not displayed.    Imaging/Diagnostic Tests: Ct Chest Wo Contrast  Addendum Date: 04/25/2018   ADDENDUM REPORT: 04/25/2018 20:00 ADDENDUM: ADDENDED IMPRESSION: 7. Metallic foreign body in the midthoracic esophagus of uncertain etiology, new since 04/23/2018 chest radiograph. These results will be called to the ordering clinician or representative by the Radiologist Assistant, and communication documented in the PACS or zVision Dashboard. Electronically Signed   By: Ilona Sorrel M.D.   On: 04/25/2018 20:00   Result Date: 04/25/2018 CLINICAL DATA:  Inpatient. CHF. Clinical concern for interstitial lung disease. EXAM: CT CHEST WITHOUT CONTRAST TECHNIQUE: Multidetector CT imaging of the chest was performed following the standard protocol without IV contrast. COMPARISON:  04/23/2018 chest radiograph. FINDINGS: Cardiovascular: Moderate cardiomegaly. Trace pericardial effusion/thickening. Three-vessel coronary atherosclerosis. Atherosclerotic thoracic aorta with ectatic 4.2 cm ascending thoracic aorta. Dilated main pulmonary artery (3.5 cm diameter). Mediastinum/Nodes: No discrete thyroid nodules. There is a 1.8 x 1.3 x 1.2 cm metallic foreign body in the midthoracic esophagus just below the level of the carina (series 3/image 71),  which is new since 04/23/2018 chest radiograph. Esophagus otherwise appears unremarkable. No axillary adenopathy. Mild right paratracheal adenopathy up to 1.2 cm (series 3/image 43). No discrete hilar adenopathy on this limited noncontrast scan. Lungs/Pleura: No pneumothorax. Small dependent bilateral pleural effusions, right greater than left. Passive atelectasis in the dependent lower lobes bilaterally. Diffuse interlobular septal thickening and extensive patchy ground-glass opacity throughout both lungs. No lung masses. No significant regions of subpleural reticulation, traction bronchiectasis, architectural distortion or frank honeycombing. Upper abdomen: No acute abnormality. Musculoskeletal: No aggressive appearing focal osseous lesions. Moderate thoracic spondylosis. IMPRESSION: 1. Moderate cardiomegaly. Small dependent bilateral pleural effusions, right greater than left. Diffuse interlobular septal thickening and extensive patchy ground-glass opacity in both lungs. This spectrum of findings is most compatible with congestive heart failure. 2. No bronchiectasis, honeycombing or other findings to suggest interstitial lung disease. 3. Dilated main pulmonary artery, suggesting pulmonary arterial hypertension. 4. Three-vessel coronary atherosclerosis. 5. Ectatic 4.2 cm ascending thoracic aorta. Recommend  annual imaging followup by CTA or MRA. This recommendation follows 2010 ACCF/AHA/AATS/ACR/ASA/SCA/SCAI/SIR/STS/SVM Guidelines for the Diagnosis and Management of Patients with Thoracic Aortic Disease. Circulation. 2010; 121: I712-X271. Aortic aneurysm NOS (ICD10-I71.9) 6. Nonspecific mild right paratracheal adenopathy, potentially reactive. No follow-up is required unless as otherwise clinically warranted. This recommendation follows ACR consensus guidelines: Managing Incidental Findings on Thoracic CT: Mediastinal and Cardiovascular Findings. A White Paper of the ACR Incidental Findings Committee. J Am Coll  Radiol. 2018; 15: 2929-0903. Aortic Atherosclerosis (ICD10-I70.0). Electronically Signed: By: Ilona Sorrel M.D. On: 04/25/2018 20:02   US Renal  Result Date: 04/25/2018 CLINICAL DATA:  Renal failure. EXAM: RENAL / URINARY TRACT ULTRASOUND COMPLETE COMPARISON:  01/20/2008 FINDINGS: Right Kidney: Renal measurements: 12.0 x 5.7 x 6.1 cm = volume: 220 mL. Right kidney is diffusely echogenic with poor definition of the internal architecture. Negative for right hydronephrosis. Very hypoechoic structure in the right kidney upper pole measures 0.9 cm and compatible with a small cyst. Anechoic structure in the right kidney lower pole measures 0.9 cm and compatible with a cyst. There is ascites in the right upper quadrant of the abdomen. Left Kidney: Renal measurements: 6.5 x 3.4 x 3.9 cm = volume: 44 mL. Limited evaluation of the left kidney. Left kidney appears to be atrophic without hydronephrosis. Bladder: Not visualized. Other: Ascites.  Bilateral pleural effusions. IMPRESSION: 1. Negative for hydronephrosis. 2. Increased echogenicity in the right kidney could be associated with chronic medical renal disease. Left kidney appears to be atrophic. 3. Small amount of upper abdominal ascites. Bilateral pleural effusions. 4. Small right renal cysts. Electronically Signed   By: Markus Daft M.D.   On: 04/25/2018 15:43      Benay Pike, MD 04/27/2018, 6:43 AM PGY-1, [place] Intern pager: [phone], text pages welcome

## 2018-04-27 NOTE — Care Management Important Message (Signed)
Important Message  Patient Details  Name: Jennifer Hinton MRN: [id number] Date of Birth: 02-18-1936   Medicare Important Message Given:  Yes    Jarmon Javid 04/27/2018, 4:02 PM

## 2018-04-27 NOTE — Op Note (Signed)
[facility] Patient Name: Jennifer Hinton Procedure Date : 04/27/2018 MRN: [id number] Attending MD: Clarene Essex , MD Date of Birth: [birth year] CSN: [id number] Age: 83 Admit Type: Inpatient Procedure:                Upper GI endoscopy Indications:              Iron deficiency anemia secondary to chronic blood                            loss, Heme positive stool want to place capsule and                            small bowel Providers:                Clarene Essex, MD, Baird Cancer, RN, Cletis Athens,                            Technician, Claybon Jabs CRNA, CRNA Referring MD:              Medicines:                Propofol total dose 160 mg IV Complications:            No immediate complications. Estimated Blood Loss:     Estimated blood loss: none. Procedure:                Pre-Anesthesia Assessment:                           - Prior to the procedure, a History and Physical                            was performed, and patient medications and                            allergies were reviewed. The patient's tolerance of                            previous anesthesia was also reviewed. The risks                            and benefits of the procedure and the sedation                            options and risks were discussed with the patient.                            All questions were answered, and informed consent                            was obtained. Prior Anticoagulants: The patient has                            taken Eliquis (apixaban), last dose was 4 days  prior to procedure. ASA Grade Assessment: III - A                            patient with severe systemic disease. After                            reviewing the risks and benefits, the patient was                            deemed in satisfactory condition to undergo the                            procedure.                           After obtaining informed consent, the endoscope was                             passed under direct vision. Throughout the                            procedure, the patient's blood pressure, pulse, and                            oxygen saturations were monitored continuously. The                            [id number]-H190 ([id number] Olympus gastroscope was                            introduced through the mouth, and advanced to the                            fourth part of duodenum. The upper GI endoscopy was                            somewhat difficult due to significant looping.                            Successful completion of the procedure was aided by                            performing the maneuvers documented (below) in this                            report. The patient tolerated the procedure well. Scope In: Scope Out: Findings:      A small hiatal hernia was present.      The entire examined stomach was normal.      The duodenal bulb, first portion of the duodenum, second portion of the       duodenum, third portion of the duodenum and fourth portion of the       duodenum were normal. Using the endoscope, the video capsule enteroscope       was advanced into the duodenal  bulb. Unfortunately it fell back in the       stomach and we tried to push it back in the duodenum and then we tried       to grab it with the Raptor grabber unsuccessfully so we grabbed it with       the Jabier Mutton net and advanced into the duodenum and the capsule was released       from the net and the scope pushed it into the distal duodenum and it did       not follow the scope back into the stomach and and with re-advancing it       had already passed from the duodenum      The exam was otherwise without abnormality. Impression:               - Small hiatal hernia.                           - Normal stomach.                           - Normal duodenal bulb, first portion of the                            duodenum, second portion of the duodenum, third                             portion of the duodenum and fourth portion of the                            duodenum.                           - The examination was otherwise normal.                           - Successful completion of the Video Capsule                            Enteroscope placement.                           - No specimens collected. Recommendation:           - Clear liquid diet today. May advance tomorrow                           - Continue present medications. Await capsule                            reading to hopefully assist with question about                            blood thinners or not going forward                           - Return to GI clinic PRN.                           -  Telephone GI clinic if symptomatic PRN. Procedure Code(s):        --- Professional ---                           [phone], Esophagogastroduodenoscopy, flexible,                            transoral; diagnostic, including collection of                            specimen(s) by brushing or washing, when performed                            (separate procedure) Diagnosis Code(s):        --- Professional ---                           K44.9, Diaphragmatic hernia without obstruction or                            gangrene                           D50.0, Iron deficiency anemia secondary to blood                            loss (chronic)                           R19.5, Other fecal abnormalities CPT copyright 2018 American Medical Association. All rights reserved. The codes documented in this report are preliminary and upon coder review may  be revised to meet current compliance requirements. Clarene Essex, MD 04/27/2018 2:19:59 PM This report has been signed electronically. Number of Addenda: 0

## 2018-04-27 NOTE — Anesthesia Postprocedure Evaluation (Signed)
Anesthesia Post Note  Patient: Jennifer Hinton  Procedure(s) Performed: ESOPHAGOGASTRODUODENOSCOPY (EGD) WITH PROPOFOL (N/A ) GIVENS CAPSULE STUDY (N/A )     Patient location during evaluation: PACU Anesthesia Type: MAC Level of consciousness: awake and alert Pain management: pain level controlled Vital Signs Assessment: post-procedure vital signs reviewed and stable Respiratory status: spontaneous breathing Cardiovascular status: stable Anesthetic complications: no    Last Vitals:  Vitals:   04/27/18 1420 04/27/18 1430  BP: (!) 115/54 (!) 113/46  Pulse: (!) 58 (!) 57  Resp: 16 19  Temp:    SpO2: 96% 96%    Last Pain:  Vitals:   04/27/18 1413  TempSrc: Axillary  PainSc: [name]

## 2018-04-27 NOTE — Progress Notes (Signed)
Patient's first unit of PRBC started at 2100.  Her PIV was patent  When rounding on patient at 2235, it was noticed that patient was asleep but PIV had been pulled out.  Blood was pooling in the bed.  Patient does not remember pulling her PIV out.  MD made aware of situation.  Another unit of blood was ordered.  IV Team obtained another PIV site and 2nd unit of blood was started.  Will obtain H & H after the 2nd unit of PRBC completes.  Per MD, gave 80 mg IV Lasix prior to hanging the second unit of blood.  Will continue to monitor patient.  Earleen Reaper RN

## 2018-04-27 NOTE — Progress Notes (Signed)
Initial Nutrition Assessment  DOCUMENTATION CODES:   Severe malnutrition in context of chronic illness  INTERVENTION:   - Advance diet to Soft when medically appropriate - Increase Ensure Enlive to TID (each provides 350 kcal, 20 g protein) - Add MVI with minerals   NUTRITION DIAGNOSIS:   Severe Malnutrition related to chronic illness as evidenced by energy intake < 75% for > or equal to 3 months, moderate fat depletion, severe muscle depletion.  GOAL:   Patient will meet greater than or equal to 90% of their needs  MONITOR:   PO intake, Supplement acceptance, Diet advancement, Labs, Weight trends  REASON FOR ASSESSMENT:   Consult Assessment of nutrition requirement/status  ASSESSMENT:   83 yo female, admitted for surgery to treat chronic GI bleeding. PMH significant for acute blood loss anemia, A-fib, chronic diastolic CHF, HLD, HTN, AKI on CKD stage 4.  Labs: potassium 3.4 low, chloride 112 elevated, BUN (73) and Creatinine (3.68) elevated, Hgb 6.9 low Meds: Ensure Enlive BID, Lasix, Protonix  Pt sitting up in bed at time of visit, family present. Endorses poor appetite x 2-3 weeks. UBW 130 lbs. Denies nausea, vomiting, or difficulty chewing or swallowing. Encouraged pt to include protein-rich foods with all meals and snacks when diet advances, and to eat those foods first if not feeling hungry. Pt amenable to continuing with Ensure supplements, will increase to TID.  NUTRITION - FOCUSED PHYSICAL EXAM:   Most Recent Value  Orbital Region  Moderate depletion  Upper Arm Region  Moderate depletion  Thoracic and Lumbar Region  Moderate depletion  Buccal Region  Severe depletion  Temple Region  Severe depletion  Clavicle Bone Region  Severe depletion  Clavicle and Acromion Bone Region  Severe depletion  Scapular Bone Region  Severe depletion  Dorsal Hand  Severe depletion  Patellar Region  Severe depletion  Anterior Thigh Region  Severe depletion  Posterior Calf  Region  Severe depletion  Edema (RD Assessment)  None  Hair  Other (Comment) thinning  Eyes  Reviewed  Mouth  Reviewed  Skin  Other (Comment) mild bruising  Nails  Reviewed     Diet Order:  No known food allergies 3/5: NPO 3/6: NPO; Soft; NPO; Soft 3/7-3/8: NPO; Soft 3/9: NPO  PO Intake 3/6-3/9: average 8% x 8 meals recorded (mostly 0%)  EDUCATION NEEDS:  Education needs have been addressed  Skin:  Skin Assessment: Reviewed RN Assessment  Last BM:  3/8  Height:  Ht Readings from Last 1 Encounters:  04/25/18 5\' 4"  (1.626 m)    Weight:  Wt Readings:  04/26/18 56.8 kg  02/03/18 59.2 kg  01/07/18 59.1 kg  12/11/17 60.7 kg  05/27/17 64.9 kg  3.9 kg wt loss x 6 months = 6.4% not indicative of malnutrition  Ideal Body Weight:  54.5 kg  BMI:  Body mass index is 21.49 kg/m., normal  Estimated Nutritional Needs: calculated based on 3/8 wt (~57 kg)  Kcal:  1425-1710 (25-30 kcal/kg)  Protein:  68-86 gm (1.2-1.5 g/kg)  Fluid:  </= 1.5 L daily or per MD  Althea Grimmer, MS, RDN, LDN Pager: [phone] Available Mondays and Fridays, 9am-2pm

## 2018-04-27 NOTE — Progress Notes (Deleted)
HPI: Fu atrial fibrillation and hypertension; also with chronic disastolic HF, pulmonary HTN and tricuspid regurgitation. Patient with severe hypertension.  Last echocardiogram April 2019 showed normal LV function, moderate left ventricular hypertrophy, mild aortic insufficiency, moderate mitral regurgitation, biatrial enlargement, severe tricuspid regurgitation and severe pulmonary hypertension.  Chest CT March 2020 showed 4.2 cm ascending thoracic aorta, dilated pulmonary artery, small bilateral pleural effusions, three-vessel coronary atherosclerosis and metallic body in the esophagus.  Has been evaluated recently for anemia.  Colonoscopy October 2019 showed polyp which was removed and diverticulosis.  EGD March 2020 negative.  Since last seen,  No current facility-administered medications for this visit.    No current outpatient medications on file.   Facility-Administered Medications Ordered in Other Visits  Medication Dose Route Frequency Provider Last Rate Last Dose  . 0.9 %  sodium chloride infusion (Manually program via Guardrails IV Fluids)   Intravenous Once Shawna Orleans, Elsia J, DO      . 0.9 %  sodium chloride infusion (Manually program via Guardrails IV Fluids)   Intravenous Once Matilde Haymaker, MD      . acetaminophen (TYLENOL) tablet 650 mg  650 mg Oral Q6H PRN Buccini, Robert, MD       Or  . acetaminophen (TYLENOL) suppository 650 mg  650 mg Rectal Q6H PRN Buccini, Robert, MD      . amLODipine (NORVASC) tablet 10 mg  10 mg Oral Daily Ronald Lobo, MD   10 mg at 04/26/18 0943  . carvedilol (COREG) tablet 25 mg  25 mg Oral BID WC Buccini, Herbie [place], MD   25 mg at 04/27/18 0901  . cloNIDine (CATAPRES) tablet 0.2 mg  0.2 mg Oral BID Roney Jaffe, MD   0.2 mg at 04/26/18 2339  . feeding supplement (ENSURE ENLIVE) (ENSURE ENLIVE) liquid 237 mL  237 mL Oral TID Zenia Resides, MD      . furosemide (LASIX) injection 80 mg  80 mg Intravenous Q8H Yoo, Elsia J, DO   80 mg at 04/27/18  0539  . hydrALAZINE (APRESOLINE) injection 5 mg  5 mg Intravenous Q4H PRN Matilde Haymaker, MD   5 mg at 04/25/18 1050  . hydrALAZINE (APRESOLINE) tablet 100 mg  100 mg Oral TID Ronald Lobo, MD   100 mg at 04/26/18 2338  . multivitamin (RENA-VIT) tablet 1 tablet  1 tablet Oral QHS Hensel, William A, MD      . pantoprazole (PROTONIX) injection 40 mg  40 mg Intravenous Q12H Ronald Lobo, MD   40 mg at 04/26/18 2338     Past Medical History:  Diagnosis Date  . Anemia due to blood loss, acute 11/2017  . Atrial fibrillation, permanent   . Chronic diastolic CHF (congestive heart failure) ([place])   . Hyperlipidemia   . Hypertension   . Pulmonary hypertension ([place])   . RA (rheumatoid arthritis) ([place])     Past Surgical History:  Procedure Laterality Date  . COLONOSCOPY WITH PROPOFOL N/A 12/16/2017   Procedure: COLONOSCOPY WITH PROPOFOL;  Surgeon: Wonda Horner, MD;  Location: [facility];  Service: Endoscopy;  Laterality: N/A;  . ESOPHAGOGASTRODUODENOSCOPY (EGD) WITH PROPOFOL N/A 12/16/2017   Procedure: ESOPHAGOGASTRODUODENOSCOPY (EGD) WITH PROPOFOL;  Surgeon: Wonda Horner, MD;  Location: [facility];  Service: Endoscopy;  Laterality: N/A;  . ESOPHAGOGASTRODUODENOSCOPY (EGD) WITH PROPOFOL Left 04/25/2018   Procedure: ESOPHAGOGASTRODUODENOSCOPY (EGD) WITH PROPOFOL;  Surgeon: Ronald Lobo, MD;  Location: [facility];  Service: Endoscopy;  Laterality: Left;  . NO PAST SURGERIES    .  POLYPECTOMY  12/16/2017   Procedure: POLYPECTOMY;  Surgeon: Wonda Horner, MD;  Location: [facility];  Service: Endoscopy;;    Social History   Socioeconomic History  . Marital status: Widowed    Spouse name: Not on file  . Number of children: 6  . Years of education: 9th grade   . Highest education level: Not on file  Occupational History  . 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  Financial resource strain: Not on file  . Food insecurity:    Worry: Not on file    Inability: Not on file  . Transportation needs:    Medical: Not on file    Non-medical: Not on file  Tobacco Use  . Smoking status: Never Smoker  . Smokeless tobacco: Never Used  Substance and Sexual Activity  . Alcohol use: No  . Drug use: No  . Sexual activity: Not on file  Lifestyle  . Physical activity:    Days per week: Not on file    Minutes per session: Not on file  . Stress: Not on file  Relationships  . Social connections:    Talks on phone: Not on file    Gets together: Not on file    Attends religious service: Not on file    Active member of club or organization: Not on file    Attends meetings of clubs or organizations: Not on file    Relationship status: Not on file  . Intimate partner violence:    Fear of current or ex partner: Not on file    Emotionally abused: Not on file    Physically abused: Not on file    Forced sexual activity: Not on file  Other Topics Concern  . Not on file  Social History Narrative   Had 6 children.   5 children living (oldest daughter passed away).    2 living in [place], 2 living in [place], 1 living in [place].       Raising your 3 yo great grandson Dorris Fetch, also a clinic patient).    Drives. Recently renewed license. Does not own a car so transportation is sometimes a limiting factor.              Family History  Problem Relation Age of Onset  . Heart disease Brother   . Hyperlipidemia Brother     ROS: no fevers or chills, productive cough, hemoptysis, dysphasia, odynophagia, melena, hematochezia, dysuria, hematuria, rash, seizure activity, orthopnea, PND, pedal edema, claudication. Remaining systems are negative.  Physical Exam: Well-developed well-nourished in no acute distress.  Skin is warm and dry.  HEENT is normal.  Neck is supple.  Chest is clear to auscultation with normal expansion.  Cardiovascular exam is regular rate and rhythm.    Abdominal exam nontender or distended. No masses palpated. Extremities show no edema. neuro grossly intact  ECG- personally reviewed  A/P  1 severe tricuspid regurgitation/moderate mitral regurgitation-we will repeat echocardiogram in April.  2 permanent atrial fibrillation-plan to continue carvedilol for rate control.  3 hypertension-patient's blood pressure is controlled today.  Continue present medications and follow.  4 chronic diastolic congestive heart failure-she appears to be euvolemic on examination today.  Continue present dose of diuretic and follow.  Kirk Ruths, MD

## 2018-04-27 NOTE — Anesthesia Preprocedure Evaluation (Signed)
Anesthesia Evaluation  Patient identified by MRN, date of birth, ID band Patient awake    Reviewed: Allergy & Precautions, NPO status , Patient's Chart, lab work & pertinent test results  History of Anesthesia Complications Negative for: history of anesthetic complications  Airway Mallampati: IV  TM Distance: >3 FB Neck ROM: Full  Mouth opening: Limited Mouth Opening  Dental  (+) Teeth Intact, Dental Advisory Given   Pulmonary shortness of breath,    breath sounds clear to auscultation       Cardiovascular hypertension, Pt. on medications +CHF   Rhythm:Irregular     Neuro/Psych negative neurological ROS  negative psych ROS   GI/Hepatic Neg liver ROS,   Endo/Other    Renal/GU ARF and CRFRenal disease     Musculoskeletal  (+) Arthritis ,   Abdominal   Peds  Hematology  (+) Blood dyscrasia, anemia ,   Anesthesia Other Findings   Reproductive/Obstetrics                             Anesthesia Physical  Anesthesia Plan  ASA: III  Anesthesia Plan: MAC   Post-op Pain Management:    Induction: Intravenous  PONV Risk Score and Plan: 2 and Treatment may vary due to age or medical condition and Propofol infusion  Airway Management Planned: Nasal Cannula  Additional Equipment: None  Intra-op Plan:   Post-operative Plan:   Informed Consent: I have reviewed the patients History and Physical, chart, labs and discussed the procedure including the risks, benefits and alternatives for the proposed anesthesia with the patient or authorized representative who has indicated his/her understanding and acceptance.     Dental advisory given  Plan Discussed with: CRNA  Anesthesia Plan Comments:         Anesthesia Quick Evaluation

## 2018-04-28 LAB — CBC
HCT: 24.3 % — ABNORMAL LOW (ref 36.0–46.0)
HCT: 25.2 % — ABNORMAL LOW (ref 36.0–46.0)
HEMOGLOBIN: 7.5 g/dL — AB (ref 12.0–15.0)
Hemoglobin: 8.2 g/dL — ABNORMAL LOW (ref 12.0–15.0)
MCH: 27.5 pg (ref 26.0–34.0)
MCH: 28.9 pg (ref 26.0–34.0)
MCHC: 30.9 g/dL (ref 30.0–36.0)
MCHC: 32.5 g/dL (ref 30.0–36.0)
MCV: 89 fL (ref 80.0–100.0)
MCV: 88.7 fL (ref 80.0–100.0)
Platelets: 153 10*3/uL (ref 150–400)
Platelets: 161 10*3/uL (ref 150–400)
RBC: 2.73 MIL/uL — AB (ref 3.87–5.11)
RBC: 2.84 MIL/uL — ABNORMAL LOW (ref 3.87–5.11)
RDW: 19.2 % — ABNORMAL HIGH (ref 11.5–15.5)
RDW: 18.8 % — ABNORMAL HIGH (ref 11.5–15.5)
WBC: 3.7 10*3/uL — ABNORMAL LOW (ref 4.0–10.5)
WBC: 3.9 10*3/uL — ABNORMAL LOW (ref 4.0–10.5)
nRBC: 0 % (ref 0.0–0.2)
nRBC: 0 % (ref 0.0–0.2)

## 2018-04-28 LAB — BASIC METABOLIC PANEL
Anion gap: 11 (ref 5–15)
BUN: 74 mg/dL — AB (ref 8–23)
CO2: 15 mmol/L — ABNORMAL LOW (ref 22–32)
Calcium: 7.8 mg/dL — ABNORMAL LOW (ref 8.9–10.3)
Chloride: 106 mmol/L (ref 98–111)
Creatinine, Ser: 3.74 mg/dL — ABNORMAL HIGH (ref 0.44–1.00)
GFR calc Af Amer: 13 mL/min — ABNORMAL LOW (ref >60)
GFR calc non Af Amer: 11 mL/min — ABNORMAL LOW (ref >60)
Glucose, Bld: 125 mg/dL — ABNORMAL HIGH (ref 70–99)
Potassium: 3.4 mmol/L — ABNORMAL LOW (ref 3.5–5.1)
SODIUM: 132 mmol/L — AB (ref 135–145)

## 2018-04-28 LAB — HEPATIC FUNCTION PANEL
ALK PHOS: 27 U/L — AB (ref 38–126)
ALT: 7 U/L (ref 0–44)
AST: 25 U/L (ref 15–41)
Albumin: 1.9 g/dL — ABNORMAL LOW (ref 3.5–5.0)
BILIRUBIN INDIRECT: 0.4 mg/dL (ref 0.3–0.9)
Bilirubin, Direct: 0.3 mg/dL — ABNORMAL HIGH (ref 0.0–0.2)
Total Bilirubin: 0.7 mg/dL (ref 0.3–1.2)
Total Protein: 6.1 g/dL — ABNORMAL LOW (ref 6.5–8.1)

## 2018-04-28 LAB — TYPE AND SCREEN
ABO/RH(D): A POS
ANTIBODY SCREEN: NEGATIVE
Unit division: 0

## 2018-04-28 LAB — BPAM RBC
Blood Product Expiration Date: 202004032359
ISSUE DATE / TIME: 202003090022
UNIT TYPE AND RH: 6200

## 2018-04-28 MED ORDER — POTASSIUM CHLORIDE CRYS ER 10 MEQ PO TBCR
40.0000 meq | EXTENDED_RELEASE_TABLET | Freq: Once | ORAL | Status: AC
  Administered 2018-04-28: 40 meq via ORAL
  Filled 2018-04-28: qty 4

## 2018-04-28 NOTE — Progress Notes (Signed)
PROCEDURE NOTE  04/23/2018 - 04/27/2018  3:34 PM  PATIENT:  Jennifer Hinton  83 y.o. female  PRE-OPERATIVE DIAGNOSIS: Subacute GI blood loss  POST-OPERATIVE DIAGNOSIS: Small bowel AVMs  PROCEDURE: Capsule endoscopy  SURGEON:  Surgeon(s): Clarene Essex, MD  ASSESSMENT/FINDINGS: At least 1 proximal AVM was seen but possibly some blood in the mid small bowel although the color on the study was not perfect  PLAN OF CARE: Consider enteroscopy if signs of ongoing blood loss but if she is not going to be restarted on blood thinners possibly we can hold off and she can be managed with erythropoietin and iron and not need further GI studies

## 2018-04-28 NOTE — Progress Notes (Signed)
Jennifer Hinton 4:13 PM  Subjective: Patient doing well without any GI complaints and I explained the pill endoscopy Findings to Her and One Family Member and she does not look at her stools and she moved her bowels today and we discussed her blood thinners as well  Objective: Vital signs stable afebrile no acute distress abdomen is soft nontender normal bowel sounds hemoglobin increased  Assessment: Probable AVM bleeding  Plan: Consider enteroscopy if signs of GI blood loss continue otherwise try to maintain hemoglobin with iron and erythropoietin and weigh the risks versus benefits of blood thinners with her and her family and please call me if I can be of any further assistance with this hospital stay  [facility]  Pager [phone] After 5PM or if no answer call [phone]

## 2018-04-28 NOTE — Progress Notes (Signed)
Family Medicine Teaching Service Daily Progress Note Intern Pager: [phone]  Patient name: Jennifer Hinton Medical record number: [id number] Date of birth: 08-08-1935 Age: 83 y.o. Gender: female  Primary Care Provider: [name], MD Consultants: GI Code Status: Full code  Pt Overview and Major Events to Date:  Hospital Day 5 Admitted: 04/23/2018   Assessment and Plan: Jennifer Hinton a 83 y.o.female whopresented with symptomatic anemia 2/2 GI bleed. PMH is significant forHLD, gout, HTN, afib, diastolic CHF, pHTN, CKD IV.   Symptomatic anemia secondary to GI bleed. Hgb continues to downtrend after transfusions. hgb 7.5 this AM.  Has received total of 4 U RBC this hospital stay. Uncertain source as had recent colonoscopy 12/16/17 with single tubular adenoma polyp and EGD on 04/25/18 negative as well.CT abd/pelvis showed fluid in the peritoneal cavity. Albumin 1.9, total protein 6.1  LFTs normal.   -Monitor Hgb, Transfusion threshold < 8 -awaiting results of repeat capsule.   -Continue Protonix - advance diet as tolerated.   AKI on CKD stage IV.  Cr 3.68 (baseline 1.5 - 1.7). Cr in December was 1.76.   Renal US showed increased echogenicity in R kidney, no hydronephrosis.  Patient put out 900 ml yesterday and 325 so far this AM. Consider MM given recent accelaration of worsening CKD along with anemia and globulin gap of > 4.  -Discussed with nephrology, start IV lasix 80 TID.  -Complement, UPC pending -MonitorBMP - avoid nephrotoxic medications F/u UPEP and SPEP  Hypertension - normotensive today - amlodipine 10 - carvedilol 25 BID -clonidine 0.2 BID - hydralazine 100 TID -hydralazine 5mg  Q4 hours PRN for SBP>180 and DBP>110  HFpEF exacerbation 2/2 fluid overload from multiple transfusions. CT chest showing bilateral pleural effusions R>L c/w CHF.Pt on 2 L [place] - furosemide 80mg  IV TID -Continue amlodipine 10mg  qd, coreg 25mg  BID, hydralazine 100mg  TID, clonidine 0.2mg   BID -nephrology following, appreciate recommendations. See above for IV diureses  A. fib - Holding Eliquis in the setting of GI bleed. - continue carvedilol  Protein calorie malnutrition -Nutrition consult  FEN/GI: soft diet PPx: IV protonix  Disposition: pending GI work up   Medications: Scheduled Meds: . amLODipine  10 mg Oral Daily  . carvedilol  25 mg Oral BID WC  . cloNIDine  0.2 mg Oral BID  . feeding supplement (ENSURE ENLIVE)  237 mL Oral TID  . furosemide  80 mg Intravenous Q8H  . hydrALAZINE  100 mg Oral TID  . multivitamin  1 tablet Oral QHS  . pantoprazole (PROTONIX) IV  40 mg Intravenous Q12H   Continuous Infusions: PRN Meds: acetaminophen **OR** acetaminophen, hydrALAZINE  ================================================= ================================================= Subjective:  Patient has no complaints, except for the food. She does not like the liquid diet options.  She has no stomach pain or nausea.  At home she does not use oxygen.  She initially tried to lose weight but during the last month it has been unintentional.  She gets full very quickly. Patient states she is not a fall risk.      Objective: Temp:  [97 F (36.1 C)-97.9 F (36.6 C)] 97.9 F (36.6 C) (03/10 0503) Pulse Rate:  [56-72] 68 (03/10 0503) Resp:  [16-20] 18 (03/10 0503) BP: (98-135)/(46-77) 125/68 (03/10 0503) SpO2:  [95 %-100 %] 99 % (03/10 0503) Weight:  [56.8 kg-61.2 kg] 61.2 kg (03/09 2034) Intake/Output 03/09 0701 - 03/10 0700 In: 720 [P.O.:620; I.V.:100] Out: 1225 [Urine:1225] Physical Exam:  Gen: NAD, alert and oriented.  CV: Regular rate and rhythm.  Normal capillary refill bilaterally.  Radial pulses 2+ bilaterally. No bilateral lower extremity edema. Resp: bibasilar crackles heard on exam.  No increased work of breathing appreciated. Abd: Nontender and nondistended on palpation to all 4 quadrants.  Positive bowel sounds. Psych: Cooperative with exam.  Pleasant. Makes eye contact.   Laboratory: Recent Labs  Lab 04/26/18 0455 04/27/18 0623 04/28/18 0525  WBC 4.5 4.2 3.7*  HGB 6.9* 8.1* 7.5*  HCT 21.5* 25.4* 24.3*  PLT 147* 136* 153   Recent Labs  Lab 04/23/18 1603  04/25/18 0432 04/26/18 0455 04/27/18 0623  NA 133*   < > 136 137 135  K 3.0*   < > 3.6 3.4* 4.0  CL 106   < > 113* 112* 112*  CO2 17*   < > 15* 17* 16*  BUN 82*   < > 77* 73* 76*  CREATININE 3.66*   < > 3.75* 3.68* 3.81*  CALCIUM 8.4*   < > 8.3* 8.1* 8.0*  PROT 7.5  --   --   --   --   BILITOT 0.6  --   --   --   --   ALKPHOS 39  --   --   --   --   ALT 8  --   --   --   --   AST 28  --   --   --   --   GLUCOSE 139*   < > 111* 95 113*   < > = values in this interval not displayed.    Imaging/Diagnostic Tests: Ct Abdomen Pelvis Wo Contrast  Result Date: 04/27/2018 CLINICAL DATA:  Unintended weight loss. Non localized abdominal pain. EXAM: CT ABDOMEN AND PELVIS WITHOUT CONTRAST TECHNIQUE: Multidetector CT imaging of the abdomen and pelvis was performed following the standard protocol without IV contrast. COMPARISON:  CT chest 04/25/2018. Ultrasound kidneys 04/25/2018 FINDINGS: Lower chest: Bilateral pleural effusions, greater on the right, with atelectasis in the lung bases. This is similar to previous study. Diffuse cardiac enlargement. Coronary artery calcifications. Hepatobiliary: Unenhanced appearance of the liver, gallbladder, and bile ducts is unremarkable. No obvious acute abnormality. Pancreas: Visualized unenhanced pancreas is unremarkable. Spleen: Unenhanced appearance of the spleen is unremarkable. Adrenals/Urinary Tract: No adrenal gland nodules. Asymmetric atrophy of the left kidney. No hydronephrosis or hydroureter. No renal or ureteral stones. Bladder is unremarkable. Stomach/Bowel: Stomach, small bowel, and colon are mostly decompressed. Scattered stool within the colon. No obvious wall thickening or inflammatory changes. Colonic diverticula are  present. Appendix is not identified. Vascular/Lymphatic: Prominent calcification of the abdominal aorta and branch vessels. Reproductive: No abnormal pelvic masses. Probable calcified fibroid in the uterus. Other: There is a moderate amount of free fluid in the abdomen and pelvis, likely ascites. No free air. Abdominal wall musculature appears intact. Diffuse edema in the subcutaneous fatty tissues throughout the abdomen and pelvis. Musculoskeletal: Degenerative changes in the lumbar spine. No destructive bone lesions. IMPRESSION: 1. Bilateral pleural effusions, greater on the right, with basilar atelectasis. 2. Cardiac enlargement. 3. Moderate abdominal and pelvic ascites. Diffuse soft tissue edema. 4. No evidence of bowel obstruction or inflammation. Electronically Signed   By: Lucienne Capers M.D.   On: 04/27/2018 21:36      Benay Pike, MD 04/28/2018, 6:33 AM PGY-1, [place] Intern pager: [phone], text pages welcome

## 2018-04-29 ENCOUNTER — Encounter (HOSPITAL_COMMUNITY): Payer: Self-pay | Admitting: Gastroenterology

## 2018-04-29 LAB — PROTEIN ELECTROPHORESIS, SERUM
A/G RATIO SPE: 0.6 — AB (ref 0.7–1.7)
Albumin ELP: 2.4 g/dL — ABNORMAL LOW (ref 2.9–4.4)
Alpha-1-Globulin: 0.2 g/dL (ref 0.0–0.4)
Alpha-2-Globulin: 0.6 g/dL (ref 0.4–1.0)
Beta Globulin: 0.7 g/dL (ref 0.7–1.3)
Gamma Globulin: 2.3 g/dL — ABNORMAL HIGH (ref 0.4–1.8)
Globulin, Total: 3.8 g/dL (ref 2.2–3.9)
M-Spike, %: 0.8 g/dL — ABNORMAL HIGH
Total Protein ELP: 6.2 g/dL (ref 6.0–8.5)

## 2018-04-29 LAB — CBC WITH DIFFERENTIAL/PLATELET
Abs Immature Granulocytes: 0.02 10*3/uL (ref 0.00–0.07)
Basophils Absolute: 0 10*3/uL (ref 0.0–0.1)
Basophils Relative: 0 %
Eosinophils Absolute: 0 10*3/uL (ref 0.0–0.5)
Eosinophils Relative: 1 %
HCT: 23.6 % — ABNORMAL LOW (ref 36.0–46.0)
Hemoglobin: 7.5 g/dL — ABNORMAL LOW (ref 12.0–15.0)
IMMATURE GRANULOCYTES: 1 %
Lymphocytes Relative: 9 %
Lymphs Abs: 0.4 10*3/uL — ABNORMAL LOW (ref 0.7–4.0)
MCH: 29 pg (ref 26.0–34.0)
MCHC: 31.8 g/dL (ref 30.0–36.0)
MCV: 91.1 fL (ref 80.0–100.0)
Monocytes Absolute: 0.2 10*3/uL (ref 0.1–1.0)
Monocytes Relative: 5 %
Neutro Abs: 3.7 10*3/uL (ref 1.7–7.7)
Neutrophils Relative %: 84 %
Platelets: 151 10*3/uL (ref 150–400)
RBC: 2.59 MIL/uL — ABNORMAL LOW (ref 3.87–5.11)
RDW: 18.6 % — ABNORMAL HIGH (ref 11.5–15.5)
WBC: 4.4 10*3/uL (ref 4.0–10.5)
nRBC: 0 % (ref 0.0–0.2)

## 2018-04-29 LAB — BASIC METABOLIC PANEL
Anion gap: 8 (ref 5–15)
BUN: 78 mg/dL — ABNORMAL HIGH (ref 8–23)
CO2: 18 mmol/L — ABNORMAL LOW (ref 22–32)
Calcium: 7.8 mg/dL — ABNORMAL LOW (ref 8.9–10.3)
Chloride: 107 mmol/L (ref 98–111)
Creatinine, Ser: 4.05 mg/dL — ABNORMAL HIGH (ref 0.44–1.00)
GFR calc Af Amer: 11 mL/min — ABNORMAL LOW (ref >60)
GFR, EST NON AFRICAN AMERICAN: 10 mL/min — AB (ref >60)
Glucose, Bld: 116 mg/dL — ABNORMAL HIGH (ref 70–99)
Potassium: 4 mmol/L (ref 3.5–5.1)
Sodium: 133 mmol/L — ABNORMAL LOW (ref 135–145)

## 2018-04-29 NOTE — Progress Notes (Signed)
Family Medicine Teaching Service Daily Progress Note Intern Pager: [phone]  Patient name: Jennifer Hinton Medical record number: [id number] Date of birth: Nov 11, 1935 Age: 83 y.o. Gender: female  Primary Care Provider: [name], MD Consultants: GI Code Status: Full code  Pt Overview and Major Events to Date:  Hospital Day 6 Admitted: 04/23/2018   Assessment and Plan: Jennifer Hinton a 83 y.o.female whopresented with symptomatic anemia 2/2 GI bleed. PMH is significant forHLD, gout, HTN, afib, diastolic CHF, pHTN, CKD IV.   Symptomatic anemia secondary to GI bleed. VS stable.   Has received total of 4 U RBC this hospital stay. Still awaiting capsule results.  CT abd/pelvis showed fluid in the peritoneal cavity. Albumin 1.9, total protein 6.1  LFTs normal.   -Monitor Hgb, Transfusion threshold < 8.  GI is recommending iron and erythropoietin if blood loss is stable.  GI bleed likely from an AVM.   -awaiting results of repeat capsule.   -Continue Protonix. Switch IV to oral - advance diet as tolerated.  - PT/OT eval and treat  AKI on CKD stage IV.  Cr continues to increase.  Was 4.05 today. Cr in December was 1.76.   Renal US showed increased echogenicity in R kidney, no hydronephrosis.  UOP 24hr 395 on 80 TID of lasix, but pt still volume overloaded on exam.   Consider MM given recent accelaration of worsening CKD along with anemia and globulin gap of > 4. Increase may be a response to diuresis.  Consider stopping lasix.   -holding furosemide currently given increase in Creatinine during admission.  -Complement, UPC pending -MonitorBMP - avoid nephrotoxic medications - F/u UPEP and SPEP  Hypertension - normotensive today - amlodipine 10 - carvedilol 25 BID -clonidine 0.2 BID - hydralazine 100 TID -hydralazine 5mg  Q4 hours PRN for SBP>180 and DBP>110  HFpEF exacerbation 2/2 fluid overload from multiple transfusions. CT chest showing bilateral pleural effusions R>L c/w  CHF.Pt on 2 L [place] - hold furosemide 80mg  IV TID -Continue amlodipine 10mg  qd, coreg 25mg  BID, hydralazine 100mg  TID, clonidine 0.2mg  BID -nephrology following, appreciate recommendations. See above for IV diureses  A. fib - Holding Eliquis in the setting of GI bleed. - continue carvedilol  Protein calorie malnutrition -Nutrition consult  FEN/GI: soft diet PPx: IV protonix  Disposition: pending GI work up   Medications: Scheduled Meds: . amLODipine  10 mg Oral Daily  . carvedilol  25 mg Oral BID WC  . cloNIDine  0.2 mg Oral BID  . feeding supplement (ENSURE ENLIVE)  237 mL Oral TID  . furosemide  80 mg Intravenous Q8H  . hydrALAZINE  100 mg Oral TID  . multivitamin  1 tablet Oral QHS  . pantoprazole (PROTONIX) IV  40 mg Intravenous Q12H   Continuous Infusions: PRN Meds: acetaminophen **OR** acetaminophen, hydrALAZINE  ================================================= ================================================= Subjective:  Patient feels good.  She states she walked to the bathroom yestreday.       Objective: Temp:  [98.3 F (36.8 C)-98.6 F (37 C)] 98.6 F (37 C) (03/11 0454) Pulse Rate:  [63-68] 68 (03/11 0454) Resp:  [19-26] 21 (03/11 0454) BP: (112-116)/(60-64) 112/64 (03/11 0454) SpO2:  [98 %-100 %] 100 % (03/11 0454) Weight:  [62 kg] 62 kg (03/10 2213) Intake/Output 03/10 0701 - 03/11 0700 In: 1260 [P.O.:1260] Out: 395 [Urine:395] Physical Exam:  Gen: NAD, alert and oriented.  CV: Regular rate and rhythm.  Normal capillary refill bilaterally.  Radial pulses 2+ bilaterally. No bilateral lower extremity edema. Resp: decreased bibasilar crackles from  yesterday, left greater than right.  No increased work of breathing appreciated. On room air.  Abd: Nontender and nondistended on palpation to all 4 quadrants.  Positive bowel sounds. Psych: Cooperative with exam. Pleasant. Makes eye contact.   Laboratory: Recent Labs  Lab 04/27/18 0623  04/28/18 0525 04/28/18 1423  WBC 4.2 3.7* 3.9*  HGB 8.1* 7.5* 8.2*  HCT 25.4* 24.3* 25.2*  PLT 136* 153 161   Recent Labs  Lab 04/23/18 1603  04/27/18 0623 04/28/18 0525 04/29/18 0433  NA 133*   < > 135 132* 133*  K 3.0*   < > 4.0 3.4* 4.0  CL 106   < > 112* 106 107  CO2 17*   < > 16* 15* 18*  BUN 82*   < > 76* 74* 78*  CREATININE 3.66*   < > 3.81* 3.74* 4.05*  CALCIUM 8.4*   < > 8.0* 7.8* 7.8*  PROT 7.5  --   --  6.1*  --   BILITOT 0.6  --   --  0.7  --   ALKPHOS 39  --   --  27*  --   ALT 8  --   --  7  --   AST 28  --   --  25  --   GLUCOSE 139*   < > 113* 125* 116*   < > = values in this interval not displayed.    Imaging/Diagnostic Tests: Ct Abdomen Pelvis Wo Contrast  Result Date: 04/27/2018 CLINICAL DATA:  Unintended weight loss. Non localized abdominal pain. EXAM: CT ABDOMEN AND PELVIS WITHOUT CONTRAST TECHNIQUE: Multidetector CT imaging of the abdomen and pelvis was performed following the standard protocol without IV contrast. COMPARISON:  CT chest 04/25/2018. Ultrasound kidneys 04/25/2018 FINDINGS: Lower chest: Bilateral pleural effusions, greater on the right, with atelectasis in the lung bases. This is similar to previous study. Diffuse cardiac enlargement. Coronary artery calcifications. Hepatobiliary: Unenhanced appearance of the liver, gallbladder, and bile ducts is unremarkable. No obvious acute abnormality. Pancreas: Visualized unenhanced pancreas is unremarkable. Spleen: Unenhanced appearance of the spleen is unremarkable. Adrenals/Urinary Tract: No adrenal gland nodules. Asymmetric atrophy of the left kidney. No hydronephrosis or hydroureter. No renal or ureteral stones. Bladder is unremarkable. Stomach/Bowel: Stomach, small bowel, and colon are mostly decompressed. Scattered stool within the colon. No obvious wall thickening or inflammatory changes. Colonic diverticula are present. Appendix is not identified. Vascular/Lymphatic: Prominent calcification of the  abdominal aorta and branch vessels. Reproductive: No abnormal pelvic masses. Probable calcified fibroid in the uterus. Other: There is a moderate amount of free fluid in the abdomen and pelvis, likely ascites. No free air. Abdominal wall musculature appears intact. Diffuse edema in the subcutaneous fatty tissues throughout the abdomen and pelvis. Musculoskeletal: Degenerative changes in the lumbar spine. No destructive bone lesions. IMPRESSION: 1. Bilateral pleural effusions, greater on the right, with basilar atelectasis. 2. Cardiac enlargement. 3. Moderate abdominal and pelvic ascites. Diffuse soft tissue edema. 4. No evidence of bowel obstruction or inflammation. Electronically Signed   By: Lucienne Capers M.D.   On: 04/27/2018 21:36      Benay Pike, MD 04/29/2018, 6:39 AM PGY-1, [place] Intern pager: [phone], text pages welcome

## 2018-04-29 NOTE — Progress Notes (Signed)
Physical Therapy Evaluation Patient Details Name: Jennifer Hinton MRN: [id number] DOB: 1935/10/22 Today's Date: 04/29/2018   History of Present Illness  Patient is 83 y/o female admitted to hospital with anemia secondary to chronic GI bleeding. Patient is s/p EGD and capsule endoscopy revealing small bowel AVMs. CT of abdomen revealed abdominal and pelvic ascites. PMH includes HLD, CKD, dCHF, afib, HTN, and gout.   Clinical Impression  Patient admitted to hospital secondary to problems above and with deficits below. Patient required modA to stand and minA to transfer to chair with RW. Patient with increased fatigue following functional mobility. Given functional mobility deficits, recommending SNF level therapy following d/c. If patient refuses will need HHPT and 24/7 supervision. Patient will benefit from acute physical therapy to maximize independence and safety with functional mobility.     Follow Up Recommendations SNF;Supervision/Assistance - 24 hour    Equipment Recommendations  Rolling walker with 5" wheels    Recommendations for Other Services       Precautions / Restrictions Precautions Precautions: Fall Restrictions Weight Bearing Restrictions: No      Mobility  Bed Mobility Overal bed mobility: Needs Assistance Bed Mobility: Supine to Sit     Supine to sit: Supervision     General bed mobility comments: Patient required supervision to sit EOB for safety. Patient required increased time and effort.  Transfers Overall transfer level: Needs assistance Equipment used: Rolling walker (2 wheeled) Transfers: Sit to/from [facility] Sit to Stand: Mod assist;From elevated surface Stand pivot transfers: Min assist       General transfer comment: Patient required modA for lift assist to stand from elevated bed with use of RW. Verbal cues for hand placement prior to standing with RW. Required minA for steadying to perfrom stand pivot transfer to chair with  RW. Patient moved very slow throughout transfer. Required verbal cues for sequencing with RW. Patient with increased fatigue following functional mobility.   Ambulation/Gait                Stairs            Wheelchair Mobility    Modified Rankin (Stroke Patients Only)       Balance Overall balance assessment: Needs assistance Sitting-balance support: No upper extremity supported;Feet supported Sitting balance-Leahy Scale: Good     Standing balance support: Bilateral upper extremity supported Standing balance-Leahy Scale: Poor Standing balance comment: reliant on BUE support to maintain standing balance                             Pertinent Vitals/Pain Pain Assessment: No/denies pain    Home Living Family/patient expects to be discharged to:: Private residence Living Arrangements: Other relatives(grand daughter) Available Help at Discharge: Family;Available 24 hours/day Type of Home: House Home Access: Stairs to enter Entrance Stairs-Rails: None Entrance Stairs-Number of Steps: 3 Home Layout: One level Home Equipment: Walker - 4 wheels;Cane - single point;Bedside commode Additional Comments: Niece reports family is getting 24/7 assist for patient at home    Prior Function Level of Independence: Independent with assistive device(s)         Comments: reports using a RW or cane for mobility depending on distance     Hand Dominance        Extremity/Trunk Assessment   Upper Extremity Assessment Upper Extremity Assessment: Overall WFL for tasks assessed    Lower Extremity Assessment Lower Extremity Assessment: Generalized weakness    Cervical / Trunk Assessment  Cervical / Trunk Assessment: Normal  Communication   Communication: No difficulties  Cognition Arousal/Alertness: Awake/alert Behavior During Therapy: WFL for tasks assessed/performed Overall Cognitive Status: Within Functional Limits for tasks assessed                                         General Comments General comments (skin integrity, edema, etc.): Patient niece in room during session    Exercises     Assessment/Plan    PT Assessment Patient needs continued PT services  PT Problem List Decreased strength;Decreased activity tolerance;Decreased balance;Decreased mobility;Decreased knowledge of use of DME       PT Treatment Interventions DME instruction;Gait training;Stair training;Functional mobility training;Therapeutic exercise;Therapeutic activities;Balance training;Patient/family education    PT Goals (Current goals can be found in the Care Plan section)  Acute Rehab PT Goals Patient Stated Goal: go home PT Goal Formulation: With patient Time For Goal Achievement: 05/13/18 Potential to Achieve Goals: Good    Frequency Min 3X/week   Barriers to discharge        Co-evaluation               AM-PAC PT "6 Clicks" Mobility  Outcome Measure Help needed turning from your back to your side while in a flat bed without using bedrails?: A Little Help needed moving from lying on your back to sitting on the side of a flat bed without using bedrails?: A Little Help needed moving to and from a bed to a chair (including a wheelchair)?: A Little Help needed standing up from a chair using your arms (e.g., wheelchair or bedside chair)?: A Lot Help needed to walk in hospital room?: A Lot Help needed climbing 3-5 steps with a railing? : Total 6 Click Score: 14    End of Session Equipment Utilized During Treatment: Gait belt Activity Tolerance: Patient tolerated treatment well Patient left: in chair;with call bell/phone within reach;with chair alarm set;with family/visitor present Nurse Communication: Mobility status PT Visit Diagnosis: Unsteadiness on feet (R26.81);Other abnormalities of gait and mobility (R26.89);Muscle weakness (generalized) (M62.81);Difficulty in walking, not elsewhere classified (R26.2)    Time:  0626-9485 PT Time Calculation (min) (ACUTE ONLY): 22 min   Charges:   PT Evaluation $PT Eval Moderate Complexity: 1 Mod          Erick Blinks, SPT  Erick Blinks 04/29/2018, 2:13 PM

## 2018-04-29 NOTE — Clinical Social Work Note (Signed)
Clinical Social Work Assessment  Patient Details  Name: Jennifer Hinton MRN: [id number] Date of Birth: 1936/02/08  Date of referral:  04/29/18               Reason for consult:  Facility Placement, Discharge Planning                Permission sought to share information with:  Family Supports Permission granted to share information::  No(Visted with patient at 2:58 pm and per patient her daughter and granddaughter are at work)  Name::        Agency::     Relationship::     Contact Information:     Housing/Transportation Living arrangements for the past 2 months:  Single Family Home(Lives with granddaughter) Source of Information:  Patient Patient Interpreter Needed:  None Criminal Activity/Legal Involvement Pertinent to Current Situation/Hospitalization:  No - Comment as needed Significant Relationships:  Adult Children, Other Family Members Lives with:  Relatives(Granddaughter Jennifer Hinton) Do you feel safe going back to the place where you live?  Yes(Patient feels safe at home, however is in agreement with ST rehab) Need for family participation in patient care:  Yes (Comment)  Care giving concerns: Patient verbalized agreement to short-term rehab when discussed with her. Jennifer Hinton main concern was that she would not be staying there permanently and patient assured that the rehab is short-term and she would then be going back home. Patient also advised that her [facility] does not pay for long-term rehab.  Social Worker assessment / plan: CSW talked with patient at the bedside regarding her discharge disposition and the recommendation of ST rehab. Jennifer Hinton was sitting up in a chair and was alert, oriented and talked with CSW regarding her discharge plan. Patient reported that she lives with her granddaughter and when asked requested that SW not call daughter or granddaughter as they are at work.   Jennifer Hinton expressed agreement with ST rehab and the process was explained and patient  was provided with the Medicare SNF list.  Employment status:  Retired Insurance underwriter information:  Programmer, applications, Medicaid [place] [employer]) PT Recommendations:  [place] / Referral to community resources:  [place]  Patient/Family's Response to care:  Patient expressed no concerns regarding her care during hospitalization.  Patient/Family's Understanding of and Emotional Response to Diagnosis, Current Treatment, and Prognosis:  Patient appeared to understand the benefit of going to short-term rehab and verbalized agreement with going.  Emotional Assessment Appearance:  Appears stated age Attitude/Demeanor/Rapport:  Other, Engaged(Appropriate) Affect (typically observed):  Appropriate Orientation:  Oriented to Self, Oriented to Place, Oriented to  Time, Oriented to Situation Alcohol / Substance use:  Tobacco Use, Alcohol Use, Illicit Drugs(Per H&P patient does not smoke, drink alcohol or use illicit drugs ) Psych involvement (Current and /or in the community):  No (Comment)  Discharge Needs  Concerns to be addressed:  Discharge Planning Concerns Readmission within the last 30 days:  No Current discharge risk:  None Barriers to Discharge:  Continued Medical Work up   [employer] Jennifer Hinton, [place] 04/29/2018, 4:03 PM

## 2018-04-29 NOTE — Progress Notes (Signed)
[facility] ROUNDING NOTE   Subjective:   Brief history this is an 83 year old lady with a history of atrial fibrillation pulmonary hypertension rheumatoid arthritis and history of GI bleed she has chronic kidney disease with a creatinine has been worsening over the past 4 -5 years.  She underwent capsule endoscopy for possible AVM 04/28/2018.  It appears that she was admitted with symptomatic anemia and GI bleed she received a total of 4 units packed red blood cells she had a colonoscopy 3 12/16/2017 with a single tubular adenoma polyp and EGD 04/25/2018 that was negative.  Her baseline serum creatinine appears to be 1.5-1.7 renal ultrasound showed increased echogenicity within the right kidney with no evidence of hydronephrosis.  Urine output 04/28/2018   595 cc.  Increasing weight from 57.6 kg to 62 kg since admission  Creatinine has increased from 1.7 baseline    on admission creatinine was 3.66.  This is gradually increased to 4.05 during her hospitalization  Current medications amlodipine 10 mg a day carvedilol 25 mg twice daily clonidine 0.2 mg 2 tablets daily Lasix 80 mg every 8 hours hydralazine 100 mg 3 times daily Protonix 40 mg daily.  Sodium 133 potassium 4.0 chloride 107 CO2 18 glucose 116 BUN 78 creatinine 4.05 calcium 7.8 WBC 4.4 hemoglobin 7.5 platelets 151.  ANA negative ANCA increased to 5.1 with positive myeloperoxidase antibodies 47.2.  Complements C3 57 C4 11 hemoglobin A1c 5.3   urinalysis 21-50 RBCs with protein creatinine ratio 1.54  Abdominal CT showed abdominal and pelvic ascites bilateral pleural effusions.  Chest CT showed moderate cardiomegaly pulmonary artery hypertension with dilated main pulmonary artery ectatic 4.2 cm thoracic aorta   Objective:  Vital signs in last 24 hours:  Temp:  [97.7 F (36.5 C)-98.6 F (37 C)] 97.7 F (36.5 C) (03/11 0849) Pulse Rate:  [57-68] 57 (03/11 0849) Resp:  [20-26] 20 (03/11 0849) BP: (112-116)/(54-64) 114/54  (03/11 0849) SpO2:  [99 %-100 %] 99 % (03/11 0849) Weight:  [62 kg] 62 kg (03/10 2213)  Weight change: 5.2 kg Filed Weights   04/27/18 1330 04/27/18 2034 04/28/18 2213  Weight: 56.8 kg 61.2 kg 62 kg    Intake/Output: I/O last 3 completed shifts: In: 1560 [P.O.:1560] Out: 1270 [Urine:1270]   Intake/Output this shift:  Total I/O In: 120 [P.O.:120] Out: -  Alert nondistressed sitting comfortably in bed CVS- RRR no murmurs rubs gallops RS- CTA no wheezes or rales ABD- BS present soft non-distended EXT- no edema cooperative   Basic Metabolic Panel: Recent Labs  Lab 04/24/18 0502  04/25/18 0432 04/26/18 0455 04/27/18 0623 04/28/18 0525 04/29/18 0433  NA 133*   < > 136 137 135 132* 133*  K 3.3*   < > 3.6 3.4* 4.0 3.4* 4.0  CL 108   < > 113* 112* 112* 106 107  CO2 14*   < > 15* 17* 16* 15* 18*  GLUCOSE 94   < > 111* 95 113* 125* 116*  BUN 78*   < > 77* 73* 76* 74* 78*  CREATININE 3.57*   < > 3.75* 3.68* 3.81* 3.74* 4.05*  CALCIUM 8.3*   < > 8.3* 8.1* 8.0* 7.8* 7.8*  MG 1.8  --   --   --   --   --   --    < > = values in this interval not displayed.    Liver Function Tests: Recent Labs  Lab 04/23/18 1603 04/28/18 0525  AST 28 25  ALT 8 7  ALKPHOS 39 27*  BILITOT 0.6 0.7  PROT 7.5 6.1*  ALBUMIN 2.5* 1.9*   No results for input(s): LIPASE, AMYLASE in the last 168 hours. No results for input(s): AMMONIA in the last 168 hours.  CBC: Recent Labs  Lab 04/23/18 1603  04/26/18 0455 04/27/18 0623 04/28/18 0525 04/28/18 1423 04/29/18 0909  WBC 3.9*   < > 4.5 4.2 3.7* 3.9* 4.4  NEUTROABS 3.4  --   --   --   --   --  3.7  HGB 5.0*   < > 6.9* 8.1* 7.5* 8.2* 7.5*  HCT 16.1*   < > 21.5* 25.4* 24.3* 25.2* 23.6*  MCV 97.6   < > 88.5 87.3 89.0 88.7 91.1  PLT 185   < > 147* 136* 153 161 151   < > = values in this interval not displayed.    Cardiac Enzymes: No results for input(s): CKTOTAL, CKMB, CKMBINDEX, TROPONINI in the last 168 hours.  BNP: Invalid  input(s): POCBNP  CBG: No results for input(s): GLUCAP in the last 168 hours.  Microbiology: No results found for this or any previous visit.  Coagulation Studies: No results for input(s): LABPROT, INR in the last 72 hours.  Urinalysis: No results for input(s): COLORURINE, LABSPEC, PHURINE, GLUCOSEU, HGBUR, BILIRUBINUR, KETONESUR, PROTEINUR, UROBILINOGEN, NITRITE, LEUKOCYTESUR in the last 72 hours.  Invalid input(s): APPERANCEUR    Imaging: Ct Abdomen Pelvis Wo Contrast  Result Date: 04/27/2018 CLINICAL DATA:  Unintended weight loss. Non localized abdominal pain. EXAM: CT ABDOMEN AND PELVIS WITHOUT CONTRAST TECHNIQUE: Multidetector CT imaging of the abdomen and pelvis was performed following the standard protocol without IV contrast. COMPARISON:  CT chest 04/25/2018. Ultrasound kidneys 04/25/2018 FINDINGS: Lower chest: Bilateral pleural effusions, greater on the right, with atelectasis in the lung bases. This is similar to previous study. Diffuse cardiac enlargement. Coronary artery calcifications. Hepatobiliary: Unenhanced appearance of the liver, gallbladder, and bile ducts is unremarkable. No obvious acute abnormality. Pancreas: Visualized unenhanced pancreas is unremarkable. Spleen: Unenhanced appearance of the spleen is unremarkable. Adrenals/Urinary Tract: No adrenal gland nodules. Asymmetric atrophy of the left kidney. No hydronephrosis or hydroureter. No renal or ureteral stones. Bladder is unremarkable. Stomach/Bowel: Stomach, small bowel, and colon are mostly decompressed. Scattered stool within the colon. No obvious wall thickening or inflammatory changes. Colonic diverticula are present. Appendix is not identified. Vascular/Lymphatic: Prominent calcification of the abdominal aorta and branch vessels. Reproductive: No abnormal pelvic masses. Probable calcified fibroid in the uterus. Other: There is a moderate amount of free fluid in the abdomen and pelvis, likely ascites. No free air.  Abdominal wall musculature appears intact. Diffuse edema in the subcutaneous fatty tissues throughout the abdomen and pelvis. Musculoskeletal: Degenerative changes in the lumbar spine. No destructive bone lesions. IMPRESSION: 1. Bilateral pleural effusions, greater on the right, with basilar atelectasis. 2. Cardiac enlargement. 3. Moderate abdominal and pelvic ascites. Diffuse soft tissue edema. 4. No evidence of bowel obstruction or inflammation. Electronically Signed   By: Lucienne Capers M.D.   On: 04/27/2018 21:36     Medications:    . amLODipine  10 mg Oral Daily  . carvedilol  25 mg Oral BID WC  . cloNIDine  0.2 mg Oral BID  . feeding supplement (ENSURE ENLIVE)  237 mL Oral TID  . furosemide  80 mg Intravenous Q8H  . hydrALAZINE  100 mg Oral TID  . multivitamin  1 tablet Oral QHS  . pantoprazole (PROTONIX) IV  40 mg Intravenous Q12H   acetaminophen **OR** acetaminophen, hydrALAZINE  Assessment/ Plan:   Acute kidney injury based on chronic kidney disease with a creatinine is increased to over 4.  She now has a nephritic appearing urine sediment with a positive MPO antibody.  I think it would be reasonable to proceed with renal biopsy will need to discuss this with patient as she is elderly.  Treatment of this condition may be complicated by the fact of her age.  It may be worth trying her with some high-dose steroids.  Will need to discuss this with family prior to initiating therapy.  [place]w to avoid nephrotoxins ACE inhibitor's ARB use nonsteroidal anti-inflammatories and Cox 2 inhibitors.  We will not receive any IV contrast  GI blood loss she assistance from Dr. Watt Climes now for capsule endoscopy to evaluate bleeding AVMs.  She has been transfused 4 units packed red blood cells since admission.  Hypertension/volume appears to be relatively well controlled at this particular point.  We will continue to follow.  Need to monitor urine output.  She is started Lasix IV per primary service.   Renal artery stenosis is being considered as a possible etiology.  This does not look to be typical for TTP or HUS.  Severe pulmonary hypertension.  Atrial fibrillation patient is holding anticoagulation secondary to GI bleed  Rheumatoid arthritis appears to be stable.   LOS: [place] @TODAY @11 :20 AM

## 2018-04-30 LAB — BASIC METABOLIC PANEL
Anion gap: 8 (ref 5–15)
BUN: 79 mg/dL — ABNORMAL HIGH (ref 8–23)
CO2: 18 mmol/L — AB (ref 22–32)
Calcium: 8 mg/dL — ABNORMAL LOW (ref 8.9–10.3)
Chloride: 106 mmol/L (ref 98–111)
Creatinine, Ser: 3.95 mg/dL — ABNORMAL HIGH (ref 0.44–1.00)
GFR calc Af Amer: 12 mL/min — ABNORMAL LOW (ref >60)
GFR calc non Af Amer: 10 mL/min — ABNORMAL LOW (ref >60)
Glucose, Bld: 111 mg/dL — ABNORMAL HIGH (ref 70–99)
Potassium: 3.6 mmol/L (ref 3.5–5.1)
Sodium: 132 mmol/L — ABNORMAL LOW (ref 135–145)

## 2018-04-30 LAB — CBC
HCT: 24.7 % — ABNORMAL LOW (ref 36.0–46.0)
Hemoglobin: 7.8 g/dL — ABNORMAL LOW (ref 12.0–15.0)
MCH: 28.2 pg (ref 26.0–34.0)
MCHC: 31.6 g/dL (ref 30.0–36.0)
MCV: 89.2 fL (ref 80.0–100.0)
Platelets: 137 10*3/uL — ABNORMAL LOW (ref 150–400)
RBC: 2.77 MIL/uL — AB (ref 3.87–5.11)
RDW: 17.9 % — AB (ref 11.5–15.5)
WBC: 4.1 10*3/uL (ref 4.0–10.5)
nRBC: 0 % (ref 0.0–0.2)

## 2018-04-30 LAB — PHOSPHORUS: Phosphorus: 4.8 mg/dL — ABNORMAL HIGH (ref 2.5–4.6)

## 2018-04-30 NOTE — Care Management Important Message (Signed)
Important Message  Patient Details  Name: Jennifer Hinton MRN: [id number] Date of Birth: November 22, 1935   Medicare Important Message Given:  Yes    [name] Montine Circle 04/30/2018, 3:13 PM

## 2018-04-30 NOTE — Clinical Social Work Placement (Signed)
   CLINICAL SOCIAL WORK PLACEMENT  NOTE  Date:  04/30/2018  Patient Details  Name: Jennifer Hinton MRN: [id number] Date of Birth: [birth year]  Clinical Social Work is seeking post-discharge placement for this patient at the [place] level of care (*CSW will initial, date and re-position this form in  chart as items are completed):  Yes   Patient/family provided with [facility] Work Department's list of facilities offering this level of care within the geographic area requested by the patient (or if unable, by the patient's family).  Yes   Patient/family informed of their freedom to choose among providers that offer the needed level of care, that participate in Medicare, Medicaid or managed care program needed by the patient, have an available bed and are willing to accept the patient.  Yes   Patient/family informed of [place]'s ownership interest in [facility] and [facility], as well as of the fact that they are under no obligation to receive care at these facilities.  PASRR submitted to EDS on 04/30/18     PASRR number received on 04/30/18     Existing PASRR number confirmed on       FL2 transmitted to all facilities in geographic area requested by pt/family on 04/30/18     FL2 transmitted to all facilities within larger geographic area on       Patient informed that his/her managed care company has contracts with or will negotiate with certain facilities, including the following:        Yes   Patient/family informed of bed offers received.  Patient chooses bed at [facility], 2nd choice [facility], 3rd choice [facility])     Physician recommends and patient chooses bed at      Patient to be transferred to   on  .  Patient to be transferred to facility by       Patient family notified on   of transfer.  Name of family member notified:        PHYSICIAN      Additional Comment: 04/30/18 - [facility]  intern contacted [facility] and message left. Later checked Epic HUB and facility declined. *[facility] contacted and message left. Received call from [facility], admissions director and was advised that they do not have any female beds.    _______________________________________________ Sable Feil, LCSW 04/30/2018, 3:55 PM

## 2018-04-30 NOTE — Progress Notes (Signed)
Family Medicine Teaching Service Daily Progress Note Intern Pager: [phone]  Patient name: Jennifer Hinton Medical record number: [id number] Date of birth: 20-Sep-1935 Age: 83 y.o. Gender: female  Primary Care Provider: [name], MD Consultants: GI Code Status: Full code  Pt Overview and Major Events to Date:  Hospital Day 7 Admitted: 04/23/2018   Assessment and Plan: Gidget Quizhpi a 83 y.o.female whopresented with symptomatic anemia 2/2 GI bleed. PMH is significant forHLD, gout, HTN, afib, diastolic CHF, pHTN, CKD IV.   Symptomatic anemia secondary to GI bleed. VS stable.   Has received total of 4 U RBC this hospital stay. Based on capsule study results, GI believes bleeding due to AVMs. Recommending iron and erythropoietin if blood loss stable. If unstable bleeding consider enteroscopy. Hold eliquis indefinitely.  -Monitor Hgb,  - advance diet as tolerated.  - PT/OT eval and treat - recommending SNF and rollating walker.   AKI on CKD stage IV.  Cr continues to increase.  Was 4.05 today. Cr in December was 1.76.   Renal US showed increased echogenicity in R kidney, no hydronephrosis.  UOP 24hr 395 on 80 TID of lasix, but pt still volume overloaded on exam.   Consider MM given recent accelaration of worsening CKD along with anemia and globulin gap of > 4. Increase may be a response to diuresis. M spike on SPEP. Discuss results with nephro -holding furosemide currently given increase in Creatinine during admission.  -Complement, UPC pending -MonitorBMP - avoid nephrotoxic medications - F/u UPEP   Hypertension - normotensive today - amlodipine 10 - carvedilol 25 BID -clonidine 0.2 BID - hydralazine 100 TID -hydralazine 5mg  Q4 hours PRN for SBP>180 and DBP>110  HFpEF exacerbation 2/2 fluid overload from multiple transfusions. CT chest showing bilateral pleural effusions R>L c/w CHF.Pt on 2 L [place] - hold furosemide 80mg  IV TID -Continue amlodipine 10mg  qd, coreg 25mg   BID, hydralazine 100mg  TID, clonidine 0.2mg  BID -nephrology following, appreciate recommendations. See above for IV diureses  A. fib - Holding Eliquis in the setting of GI bleed. - continue carvedilol  Protein calorie malnutrition -Nutrition consult  FEN/GI: soft diet PPx: IV protonix  Disposition: pending GI work up   Medications: Scheduled Meds: . amLODipine  10 mg Oral Daily  . carvedilol  25 mg Oral BID WC  . cloNIDine  0.2 mg Oral BID  . feeding supplement (ENSURE ENLIVE)  237 mL Oral TID  . hydrALAZINE  100 mg Oral TID  . multivitamin  1 tablet Oral QHS   Continuous Infusions: PRN Meds: acetaminophen **OR** acetaminophen, hydrALAZINE  ================================================= ================================================= Subjective:  patient has no complaints today.  No dyspnea.        Objective: Temp:  [97.7 F (36.5 C)-100.2 F (37.9 C)] 99.4 F (37.4 C) (03/12 0523) Pulse Rate:  [57-71] 68 (03/12 0523) Resp:  [18-24] 19 (03/12 0523) BP: (111-127)/(54-100) 124/66 (03/12 0523) SpO2:  [93 %-99 %] 99 % (03/12 0523) Weight:  [62.7 kg] 62.7 kg (03/11 2139) Intake/Output 03/11 0701 - 03/12 0700 In: 900 [P.O.:900] Out: 500 [Urine:500] Physical Exam:  Gen: NAD, alert and oriented. cachectic CV: Regular rate and rhythm.  Normal capillary refill bilaterally.  Radial pulses 2+ bilaterally. No bilateral lower extremity edema. Resp: minimal bibasilar crackles from yesterday, left greater than right.  No increased work of breathing appreciated. On room air.  Abd: Nontender and nondistended on palpation to all 4 quadrants.  Positive bowel sounds.  Psych: Cooperative with exam. Pleasant. Makes eye contact.   Laboratory: Recent Labs  Lab 04/28/18 0525 04/28/18 1423 04/29/18 0909  WBC 3.7* 3.9* 4.4  HGB 7.5* 8.2* 7.5*  HCT 24.3* 25.2* 23.6*  PLT 153 161 151   Recent Labs  Lab 04/23/18 1603  04/27/18 0623 04/28/18 0525 04/29/18 0433  NA  133*   < > 135 132* 133*  K 3.0*   < > 4.0 3.4* 4.0  CL 106   < > 112* 106 107  CO2 17*   < > 16* 15* 18*  BUN 82*   < > 76* 74* 78*  CREATININE 3.66*   < > 3.81* 3.74* 4.05*  CALCIUM 8.4*   < > 8.0* 7.8* 7.8*  PROT 7.5  --   --  6.1*  --   BILITOT 0.6  --   --  0.7  --   ALKPHOS 39  --   --  27*  --   ALT 8  --   --  7  --   AST 28  --   --  25  --   GLUCOSE 139*   < > 113* 125* 116*   < > = values in this interval not displayed.    Imaging/Diagnostic Tests: No results found.    Benay Pike, MD 04/30/2018, 6:42 AM PGY-1, [facility] Intern pager: [phone], text pages welcome

## 2018-04-30 NOTE — NC FL2 (Signed)
[place] LEVEL OF CARE SCREENING TOOL     IDENTIFICATION  Patient Name: Jennifer Hinton Birthdate: 11-24-1935 Sex: female Admission Date (Current Location): 04/23/2018  [facility] and [place] Number:  Herbalist and Address:  [facility] [place] [address] [place], [place] [address]      Provider Number: [id number]  Attending Physician Name and Address:  Zenia Resides, MD  Relative Name and Phone Number:  Daughter-Carolyn Herbin [phone]    Current Level of Care: Hospital Recommended Level of Care: [place] Prior Approval Number:    Date Approved/Denied:   PASRR Number: [id number]  Discharge Plan: SNF    Current Diagnoses: Patient Active Problem List   Diagnosis Date Noted  . Protein-calorie malnutrition, severe 04/27/2018  . Early satiety   . Weight loss, unintentional   . Fatigue associated with anemia 04/23/2018  . Chronic GI bleeding 04/23/2018  . Symptomatic anemia 12/19/2017  . Iron deficiency anemia 12/19/2017  . Weight loss 12/19/2017  . CKD (chronic kidney disease) stage 4, GFR 15-29 ml/min (HCC) 12/19/2017  . AKI (acute kidney injury) ([place]) 12/17/2017  . A-fib ([place]) 12/11/2017  . Weakness   . Healthcare maintenance 10/17/2015  . Post-menopause 10/17/2015  . Hemarthrosis 12/19/2014  . Chronic diastolic congestive heart failure ([place]) 10/03/2014  . Essential hypertension, benign 06/14/2014  . Knee pain 06/14/2014  . Hypertensive cardiovascular disease- LVH 06/06/2014  . Pulmonary hypertension-severe 06/06/2014  . Tricuspid regurgitation-severe 06/06/2014  . Malnutrition of moderate degree ([place]) 06/06/2014  . Acute on chronic diastolic congestive heart failure ([place])   . Dyspnea 03/09/2014  . Falls 03/09/2014  . Tinnitus 10/05/2012  . Anticoagulant long-term use 05/23/2010  . LIPOMA 11/30/2008  . PROTEINURIA 11/30/2008  . Gout, unspecified 06/27/2008  . HTN (hypertension) 12/17/2006   . Dyslipidemia 04/17/2006  . OBESITY, NOS 04/17/2006  . Permanent atrial fibrillation 04/17/2006  . Osteoarthrosis, unspecified whether generalized or localized, involving lower leg 04/17/2006    Orientation RESPIRATION BLADDER Height & Weight     Time, Self, Situation, Place  Normal Incontinent Weight: 138 lb 3.7 oz (62.7 kg) Height:  5\' 4"  (162.6 cm)  BEHAVIORAL SYMPTOMS/MOOD NEUROLOGICAL BOWEL NUTRITION STATUS      Continent Diet(Heart healthy diet)  AMBULATORY STATUS COMMUNICATION OF NEEDS Skin   Limited Assist(PT unable to ambulate at this time) Verbally Normal                       Personal Care Assistance Level of Assistance  Bathing, Feeding, Dressing Bathing Assistance: Limited assistance(Min assist) Feeding assistance: Limited assistance(Assistance with setup) Dressing Assistance: Limited assistance(Min assist)     Functional Limitations Info  Sight, Hearing, Speech Sight Info: Impaired Hearing Info: Adequate Speech Info: Adequate    SPECIAL CARE FACTORS FREQUENCY  PT (By licensed PT), OT (By licensed OT)     PT Frequency: PT at SNF evaluate and treat OT Frequency: OT at SNF evaluate and treat            Contractures Contractures Info: Not present    Additional Factors Info  Code Status, Allergies Code Status Info: Full code Allergies Info: No known allergies           Current Medications (04/30/2018):  This is the current hospital active medication list Current Facility-Administered Medications  Medication Dose Route Frequency Provider Last Rate Last Dose  . acetaminophen (TYLENOL) tablet 650 mg  650 mg Oral Q6H PRN Clarene Essex, MD  Or  . acetaminophen (TYLENOL) suppository 650 mg  650 mg Rectal Q6H PRN Clarene Essex, MD      . amLODipine (NORVASC) tablet 10 mg  10 mg Oral Daily Clarene Essex, MD   10 mg at 04/30/18 1045  . carvedilol (COREG) tablet 25 mg  25 mg Oral BID WC Clarene Essex, MD   25 mg at 04/30/18 1046  . cloNIDine (CATAPRES)  tablet 0.2 mg  0.2 mg Oral BID Clarene Essex, MD   0.2 mg at 04/30/18 1045  . feeding supplement (ENSURE ENLIVE) (ENSURE ENLIVE) liquid 237 mL  237 mL Oral TID Clarene Essex, MD   237 mL at 04/29/18 2201  . hydrALAZINE (APRESOLINE) injection 5 mg  5 mg Intravenous Q4H PRN Clarene Essex, MD   5 mg at 04/25/18 1050  . hydrALAZINE (APRESOLINE) tablet 100 mg  100 mg Oral TID Clarene Essex, MD   100 mg at 04/30/18 1045  . multivitamin (RENA-VIT) tablet 1 tablet  1 tablet Oral QHS Clarene Essex, MD   1 tablet at 04/29/18 2201     Discharge Medications: Please see discharge summary for a list of discharge medications.  Relevant Imaging Results:  Relevant Lab Results:   Additional Information SSN: [id number]  Britt [place] Work [phone]

## 2018-04-30 NOTE — Progress Notes (Signed)
[facility] ROUNDING NOTE   Subjective:   Brief history this is an 83 year old lady with a history of atrial fibrillation pulmonary hypertension rheumatoid arthritis and history of GI bleed she has chronic kidney disease with a creatinine has been worsening over the past 4 -5 years.  She underwent capsule endoscopy for possible AVM 04/28/2018.  It appears that she was admitted with symptomatic anemia and GI bleed she received a total of 4 units packed red blood cells she had a colonoscopy 3 12/16/2017 with a single tubular adenoma polyp and EGD 04/25/2018 that was negative.  Her baseline serum creatinine appears to be 1.5-1.7 renal ultrasound showed increased echogenicity within the right kidney with no evidence of hydronephrosis.  Urine output 425 cc 04/29/2018.  She has had 600 cc 04/30/2018 weight is increased to 62.7 kg from admission weight of 57.6 kg  Blood pressure 96/73 pulse 66 temperature 8.7 O2 sats 96% room air  Creatinine has increased from 1.7 baseline    on admission creatinine was 3.66.  This is gradually increased to 4.05 during her hospitalization  Current medications amlodipine 10 mg a day carvedilol 25 mg twice daily clonidine 0.2 mg 2 tablets daily Lasix 80 mg every 8 hours hydralazine 100 mg 3 times daily Protonix 40 mg daily.  Sodium 132 potassium 3.6 chloride 106 CO2 18 glucose 111 BUN 79 creatinine 3.95 calcium 8.0.   ANA negative ANCA increased to 5.1 with positive myeloperoxidase antibodies 47.2.  Complements C3 57 C4 11 hemoglobin A1c 5.3   urinalysis 21-50 RBCs with protein creatinine ratio 1.54  Abdominal CT showed abdominal and pelvic ascites bilateral pleural effusions.  Chest CT showed moderate cardiomegaly pulmonary artery hypertension with dilated main pulmonary artery ectatic 4.2 cm thoracic aorta   Objective:  Vital signs in last 24 hours:  Temp:  [98.6 F (37 C)-100.2 F (37.9 C)] 98.7 F (37.1 C) (03/12 0834) Pulse Rate:  [65-71] 66 (03/12  0834) Resp:  [18-24] 18 (03/12 0834) BP: (96-127)/(57-100) 96/73 (03/12 0834) SpO2:  [93 %-99 %] 96 % (03/12 0834) Weight:  [62.7 kg] 62.7 kg (03/11 2139)  Weight change: 0.7 kg Filed Weights   04/27/18 2034 04/28/18 2213 04/29/18 2139  Weight: 61.2 kg 62 kg 62.7 kg    Intake/Output: I/O last 3 completed shifts: In: 1320 [P.O.:1320] Out: 945 [Urine:945]   Intake/Output this shift:  Total I/O In: 220 [P.O.:220] Out: 100 [Urine:100] Alert nondistressed sitting comfortably in bed CVS- RRR no murmurs rubs gallops RS- CTA no wheezes or rales ABD- BS present soft non-distended EXT- no edema cooperative   Basic Metabolic Panel: Recent Labs  Lab 04/24/18 0502  04/26/18 0455 04/27/18 0623 04/28/18 0525 04/29/18 0433 04/30/18 0630  NA 133*   < > 137 135 132* 133* 132*  K 3.3*   < > 3.4* 4.0 3.4* 4.0 3.6  CL 108   < > 112* 112* 106 107 106  CO2 14*   < > 17* 16* 15* 18* 18*  GLUCOSE 94   < > 95 113* 125* 116* 111*  BUN 78*   < > 73* 76* 74* 78* 79*  CREATININE 3.57*   < > 3.68* 3.81* 3.74* 4.05* 3.95*  CALCIUM 8.3*   < > 8.1* 8.0* 7.8* 7.8* 8.0*  MG 1.8  --   --   --   --   --   --    < > = values in this interval not displayed.    Liver Function Tests: Recent Labs  Lab 04/23/18 1603 04/28/18 0525  AST 28 25  ALT 8 7  ALKPHOS 39 27*  BILITOT 0.6 0.7  PROT 7.5 6.1*  ALBUMIN 2.5* 1.9*   No results for input(s): LIPASE, AMYLASE in the last 168 hours. No results for input(s): AMMONIA in the last 168 hours.  CBC: Recent Labs  Lab 04/23/18 1603  04/27/18 0623 04/28/18 0525 04/28/18 1423 04/29/18 0909 04/30/18 0630  WBC 3.9*   < > 4.2 3.7* 3.9* 4.4 4.1  NEUTROABS 3.4  --   --   --   --  3.7  --   HGB 5.0*   < > 8.1* 7.5* 8.2* 7.5* 7.8*  HCT 16.1*   < > 25.4* 24.3* 25.2* 23.6* 24.7*  MCV 97.6   < > 87.3 89.0 88.7 91.1 89.2  PLT 185   < > 136* 153 161 151 137*   < > = values in this interval not displayed.    Cardiac Enzymes: No results for input(s):  CKTOTAL, CKMB, CKMBINDEX, TROPONINI in the last 168 hours.  BNP: Invalid input(s): POCBNP  CBG: No results for input(s): GLUCAP in the last 168 hours.  Microbiology: No results found for this or any previous visit.  Coagulation Studies: No results for input(s): LABPROT, INR in the last 72 hours.  Urinalysis: No results for input(s): COLORURINE, LABSPEC, PHURINE, GLUCOSEU, HGBUR, BILIRUBINUR, KETONESUR, PROTEINUR, UROBILINOGEN, NITRITE, LEUKOCYTESUR in the last 72 hours.  Invalid input(s): APPERANCEUR    Imaging: No results found.   Medications:    . amLODipine  10 mg Oral Daily  . carvedilol  25 mg Oral BID WC  . cloNIDine  0.2 mg Oral BID  . feeding supplement (ENSURE ENLIVE)  237 mL Oral TID  . hydrALAZINE  100 mg Oral TID  . multivitamin  1 tablet Oral QHS   acetaminophen **OR** acetaminophen, hydrALAZINE  Assessment/ Plan:   Acute kidney injury based on chronic kidney disease with a creatinine is increased to over 4.  She now has a nephritic appearing urine sediment with a positive MPO antibody.  I think it would be reasonable to proceed with renal biopsy this was discussed with the patient 04/29/2018 she did not wish to undergo renal biopsy understanding that we do not know the course of her renal disease. Treatment of this condition may be complicated by the fact of her age.  It may be worth trying her with some high-dose steroids.  We will not proceed with a renal biopsy at this time as patient is not agreeable.  We will continue to avoid nephrotoxins ACE inhibitor's ARB use nonsteroidal anti-inflammatories and Cox 2 inhibitors.  We will not receive any IV contrast  GI blood loss she assistance from Dr. Watt Climes now for capsule endoscopy to evaluate bleeding AVMs.  She has been transfused 4 units packed red blood cells since admission.  Hypertension/volume appears to be relatively well controlled at this particular point.  She is started on IV Lasix per primary service,  urine output appears to be improved.  There has been a slight decrease in serum creatinine..  Renal artery stenosis is being considered as a possible etiology.  This does not look to be typical for TTP or HUS.  Severe pulmonary hypertension.  Atrial fibrillation patient is holding anticoagulation secondary to GI bleed  Rheumatoid arthritis appears to be stable.   LOS: [place] @TODAY @12 :10 PM

## 2018-04-30 NOTE — Progress Notes (Signed)
Occupational Therapy Evaluation Patient Details Name: Jennifer Hinton MRN: [id number] DOB: 01-16-1936 Today's Date: 04/30/2018    History of Present Illness Patient is 83 y/o female admitted to hospital with anemia secondary to chronic GI bleeding. Patient is s/p EGD and capsule endoscopy revealing small bowel AVMs. CT of abdomen revealed abdominal and pelvic ascites. PMH includes HLD, CKD, dCHF, afib, HTN, and gout.    Clinical Impression   PTA, pt was living at home alone, with some assistance from family for ADL/IADL, pt reports she was independent with dressing, bird baths, and grooming and was independent with functional mobility without use of AD. Pt currently requires minA for ADL and functional mobility. Pt and pt's daughter agreeable to SNF level therapy following d/c. Due to decline in current level of function, pt would benefit from acute OT to address established goals to maximize safety and independence with ADL and functional mobility. Will continue to follow acutely.      Follow Up Recommendations  SNF;Supervision/Assistance - 24 hour    Equipment Recommendations  3 in 1 bedside commode    Recommendations for Other Services PT consult     Precautions / Restrictions Precautions Precautions: Fall Restrictions Weight Bearing Restrictions: No      Mobility Bed Mobility Overal bed mobility: Needs Assistance Bed Mobility: Supine to Sit     Supine to sit: Supervision;HOB elevated     General bed mobility comments: Patient required supervision to sit EOB for safety. Patient required increased time and effort.  Transfers Overall transfer level: Needs assistance Equipment used: Rolling walker (2 wheeled) Transfers: Sit to/from Stand Sit to Stand: Min assist         General transfer comment: Patient required minA for lift assist to stand from elevated bed with use of RW. Verbal cues for hand placement prior to standing with RW. Required minA for steadying to perfrom  stand pivot transfer to chair with RW. Patient moved very slow throughout transfer. Required verbal cues for sequencing with RW. Patient with increased fatigue following functional mobility.     Balance Overall balance assessment: Needs assistance Sitting-balance support: No upper extremity supported;Feet supported Sitting balance-Leahy Scale: Good     Standing balance support: Bilateral upper extremity supported Standing balance-Leahy Scale: Poor Standing balance comment: reliant on BUE support to maintain standing balance                           ADL either performed or assessed with clinical judgement   ADL Overall ADL's : Needs assistance/impaired Eating/Feeding: Set up;Sitting   Grooming: Minimal assistance;Standing   Upper Body Bathing: Minimal assistance;Standing   Lower Body Bathing: Minimal assistance;Sit to/from stand   Upper Body Dressing : Set up;Sitting   Lower Body Dressing: Minimal assistance;Sit to/from stand   Toilet Transfer: Minimal assistance;RW;Ambulation Armed forces technical officer Details (indicate cue type and reason): simulated from EOB ambulating to recliner Toileting- Clothing Manipulation and Hygiene: Minimal assistance;Sit to/from stand       Functional mobility during ADLs: Minimal assistance;Rolling walker General ADL Comments: pt with decreased activity tolerance      Vision         Perception     Praxis      Pertinent Vitals/Pain Pain Assessment: No/denies pain     Hand Dominance     Extremity/Trunk Assessment Upper Extremity Assessment Upper Extremity Assessment: Overall WFL for tasks assessed   Lower Extremity Assessment Lower Extremity Assessment: Defer to PT evaluation   Cervical / Trunk  Assessment Cervical / Trunk Assessment: Kyphotic   Communication Communication Communication: No difficulties   Cognition Arousal/Alertness: Awake/alert Behavior During Therapy: WFL for tasks assessed/performed Overall Cognitive  Status: Within Functional Limits for tasks assessed                                     General Comments  Pt's daughter present during session    Exercises     Shoulder Instructions      Home Living Family/patient expects to be discharged to:: Private residence Living Arrangements: Other relatives(grand daughter) Available Help at Discharge: Family;Available 24 hours/day Type of Home: House Home Access: Stairs to enter [employer] of Steps: 3 Entrance Stairs-Rails: None Home Layout: One level     Bathroom Shower/Tub: Teacher, early years/pre: Standard     Home Equipment: Environmental consultant - 4 wheels;Cane - single point;Bedside commode   Additional Comments: Niece reports family is getting 24/7 assist for patient at home      Prior Functioning/Environment Level of Independence: Independent with assistive device(s)        Comments: reports using a RW or cane for mobility depending on distance        OT Problem List: Decreased strength;Decreased range of motion;Decreased activity tolerance;Impaired balance (sitting and/or standing);Decreased safety awareness;Decreased knowledge of use of DME or AE;Cardiopulmonary status limiting activity      OT Treatment/Interventions: Self-care/ADL training;Therapeutic exercise;Neuromuscular education;Energy conservation;DME and/or AE instruction;Therapeutic activities;Patient/family education;Balance training    OT Goals(Current goals can be found in the care plan section) Acute Rehab OT Goals Patient Stated Goal: to get stronger OT Goal Formulation: With patient Time For Goal Achievement: 05/14/18 Potential to Achieve Goals: Good ADL Goals Pt Will Perform Grooming: with supervision Pt Will Perform Upper Body Bathing: with supervision Pt Will Perform Lower Body Dressing: with modified independence Pt Will Transfer to Toilet: with supervision;ambulating  OT Frequency: Min 2X/week   Barriers to D/C:  Decreased caregiver support  appropriate level of support not available at d/c       Co-evaluation              AM-PAC OT "6 Clicks" Daily Activity     Outcome Measure Help from another person eating meals?: None Help from another person taking care of personal grooming?: A Little Help from another person toileting, which includes using toliet, bedpan, or urinal?: A Little Help from another person bathing (including washing, rinsing, drying)?: A Little Help from another person to put on and taking off regular upper body clothing?: A Little Help from another person to put on and taking off regular lower body clothing?: A Little 6 Click Score: 19   End of Session Equipment Utilized During Treatment: Gait belt;Rolling walker Nurse Communication: Mobility status  Activity Tolerance: Patient limited by fatigue;Patient tolerated treatment well Patient left: in chair;with call bell/phone within reach;with chair alarm set;with family/visitor present  OT Visit Diagnosis: Unsteadiness on feet (R26.81);Other abnormalities of gait and mobility (R26.89);Muscle weakness (generalized) (M62.81)                Time: 9702-6378 OT Time Calculation (min): 20 min Charges:  OT General Charges $OT Visit: 1 Visit OT Evaluation $OT Eval Moderate Complexity: [place] OTR/L Acute Rehabilitation Services Office: [place] 04/30/2018, 12:53 PM

## 2018-04-30 NOTE — Progress Notes (Signed)
CSW and SW intern spoke with family regarding their preferences of which SNF Jennifer Hinton would discharge to. They selected: [facility], [facility], and [facility]. SW intern faxed out information to the Skilled Nursing Facilities. [facility] accepted but later stated that they would not be accepting any new patients due to COVID-19. [facility] and [facility] had not responded so SW intern called the two facilites and left voicemails for both. [facility] called back and informed SW intern that they did not have any female rehab beds available. [facility] did not call back but responded in the Epic hub with a denial. This left all 3 preferred facilities unable to accept Jennifer Hinton.   Eight other facilities responded accepting. Patient's daughter was informed of these options and has chosen [facility]. CSW contacted Tye [place], the hospital liaison for [facility] and she stated they will start authorization today.

## 2018-05-01 ENCOUNTER — Inpatient Hospital Stay (HOSPITAL_COMMUNITY): Payer: Medicare Other

## 2018-05-01 DIAGNOSIS — K922 Gastrointestinal hemorrhage, unspecified: Secondary | ICD-10-CM

## 2018-05-01 LAB — CBC
HCT: 25.6 % — ABNORMAL LOW (ref 36.0–46.0)
Hemoglobin: 8.3 g/dL — ABNORMAL LOW (ref 12.0–15.0)
MCH: 28.8 pg (ref 26.0–34.0)
MCHC: 32.4 g/dL (ref 30.0–36.0)
MCV: 88.9 fL (ref 80.0–100.0)
Platelets: 143 10*3/uL — ABNORMAL LOW (ref 150–400)
RBC: 2.88 MIL/uL — ABNORMAL LOW (ref 3.87–5.11)
RDW: 17.7 % — ABNORMAL HIGH (ref 11.5–15.5)
WBC: 4.2 10*3/uL (ref 4.0–10.5)
nRBC: 0 % (ref 0.0–0.2)

## 2018-05-01 LAB — RENAL FUNCTION PANEL
ALBUMIN: 2 g/dL — AB (ref 3.5–5.0)
Anion gap: 9 (ref 5–15)
BUN: 81 mg/dL — ABNORMAL HIGH (ref 8–23)
CHLORIDE: 104 mmol/L (ref 98–111)
CO2: 18 mmol/L — ABNORMAL LOW (ref 22–32)
Calcium: 8.1 mg/dL — ABNORMAL LOW (ref 8.9–10.3)
Creatinine, Ser: 3.94 mg/dL — ABNORMAL HIGH (ref 0.44–1.00)
GFR calc Af Amer: 12 mL/min — ABNORMAL LOW (ref >60)
GFR, EST NON AFRICAN AMERICAN: 10 mL/min — AB (ref >60)
Glucose, Bld: 103 mg/dL — ABNORMAL HIGH (ref 70–99)
Phosphorus: 4.9 mg/dL — ABNORMAL HIGH (ref 2.5–4.6)
Potassium: 3.7 mmol/L (ref 3.5–5.1)
Sodium: 131 mmol/L — ABNORMAL LOW (ref 135–145)

## 2018-05-01 MED ORDER — CLONIDINE HCL 0.1 MG PO TABS
0.1000 mg | ORAL_TABLET | Freq: Two times a day (BID) | ORAL | Status: DC
  Administered 2018-05-01 – 2018-05-02 (×2): 0.1 mg via ORAL
  Filled 2018-05-01 (×2): qty 1

## 2018-05-01 MED ORDER — HYDRALAZINE HCL 50 MG PO TABS
50.0000 mg | ORAL_TABLET | Freq: Three times a day (TID) | ORAL | Status: DC
  Administered 2018-05-01 – 2018-05-08 (×23): 50 mg via ORAL
  Filled 2018-05-01 (×23): qty 1

## 2018-05-01 NOTE — Discharge Summary (Deleted)
[facility] Discharge Summary  Patient name: Jennifer Hinton Medical record number: [id number] Date of birth: May 01, 1935 Age: 83 y.o. Gender: female Date of Admission: 04/23/2018  Date of Discharge: 05/07/2018 Admitting Physician: Zenia Resides, MD  Primary Care Provider: [name], MD Consultants: GI, nephro, palliative.  Indication for Hospitalization: symptomatic anemia  Discharge Diagnoses/Problem List:  GI bleed Anemia End-stage renal disease UTI HTN HFpEF Afib  Disposition: SNF,   Discharge Condition: improved, stable  Discharge Exam:  BP (!) 146/73   Pulse 80   Temp 99.2 F (37.3 C) (Oral)   Resp 18   Ht 5' 4"  (1.626 m)   Wt 65.3 kg   SpO2 96%   BMI 24.71 kg/m  Gen: sitting up in bed.   CV: irregularly irregular rhythm.  2+ pulses.   No edema.   Resp: Normal work of breathing on room air. Crackles still present on inspiration, L>R.  No acute distress.   Abd: soft, nontender. Normal bowel sounds.  Psych: Normal mood and pleasant affect.    Brief Hospital Course:  Patient was initially admitted to the hospital from clinic after finding a hemoglobin of 4.5.  Hemoglobin in ED was 5.0.  Patient received 4 units packed red blood cells in total before stabilizing.  Capsule swallow study was performed and AVM believed to be the cause of bleed were noted.  Patient's Eliquis, which she was on for A. fib, was held permanently due to concern for bleeding.  Patient has history of chronic kidney disease, but has acutely worsened over the past 4 months.  Nephrology was consulted.  Patient tested positive for MPO antibody.  Low complement levels were noted.  SPEP was performed in response to globulin Gap, and patient was found to have M spike.  After discussion with primary team, nephrologist, and palliative care, it was decided that the cause of patient's kidney disease would not be worked up, and the patient would be put on hospice care upon  returning home from skilled nursing facility.  Fever: Patient developed intermittent nightly fevers while on antibiotics for UTI.  Fevers would resolve by morning.  Patient remained asymptomatic throughout.  Fevers believed to be caused by a viral infection, in addition to the UTI that was treated with cefepime, followed by azithromycin and cefdinir. Patient was afebrile for over 24 hours prior to discharge.   CHF: Patient was having dyspnea requiring supplemental oxygen.  Lasix were given, but eventually stopped due to worsening creatinine, which was later discovered to be due to an unknown kidney disorder.  Despite being off Lasix patient continued to make urine, and did not require any more supplemental oxygen during her stay.  She continued to have crackles on auscultation, but no complaints of dyspnea, or cough.   Issues for Follow Up:  1. Hospice - patient to transition to hospice care after discharge from SNF.  Palliative medicine is working with patient and will continue outpatient.  2. Renal - patient has worsening renal disease.  Possible vasculitis, possible multiple myeloma.  Patient decided not to work it up any further.  3. GI bleed - patient hgb stabilized ~8.5 s/p 4 U pRBC.  Likely due to AVM.  Consider follow up hgb in 1 week.  4. Anticoagulation - we are holding eliquis indefinitely in setting of multiple GI bleeds.   5. CHF - we have held lasix on admission.  Respiratory status did not worsen noticeably after discontinuation of lasix.   Significant Procedures: EGD, capsule  study.    Significant Labs and Imaging:  Recent Labs  Lab 05/05/18 0528 05/06/18 0538 05/07/18 0636  WBC 4.9 4.2 5.0  HGB 9.1* 8.5* 9.1*  HCT 28.1* 26.8* 28.6*  PLT 180 142* 150   Recent Labs  Lab 05/01/18 0555 05/02/18 0646 05/04/18 0227 05/05/18 0528 05/05/18 1458 05/06/18 0538 05/07/18 0636  NA 131* 132* 129* 129* 130* 131* 131*  K 3.7 3.6 3.3* 5.3* 4.1 3.8 4.5  CL 104 103 102 102 105 108  104  CO2 18* 17* 17* 18* 16* 16* 16*  GLUCOSE 103* 116* 124* 109* 120* 98 101*  BUN 81* 83* 79* 75* 76* 72* 68*  CREATININE 3.94* 3.85* 3.57* 3.40* 3.30* 3.21* 2.97*  CALCIUM 8.1* 8.3* 8.1* 8.3* 8.2* 8.0* 8.4*  PHOS 4.9* 4.7*  --   --   --   --   --   ALBUMIN 2.0*  --   --   --   --   --   --       Results/Tests Pending at Time of Discharge: none  Discharge Medications:  Allergies as of 05/07/2018   No Known Allergies     Medication List    STOP taking these medications   acetaminophen 650 MG CR tablet Commonly known as:  TYLENOL   bisacodyl 10 MG suppository Commonly known as:  Dulcolax   Eliquis 2.5 MG Tabs tablet Generic drug:  apixaban   furosemide 40 MG tablet Commonly known as:  LASIX   polyethylene glycol packet Commonly known as:  MiraLax   senna 8.6 MG Tabs tablet Commonly known as:  SENOKOT     TAKE these medications   amLODipine 10 MG tablet Commonly known as:  NORVASC TAKE 1 TABLET BY MOUTH EVERY DAY   azithromycin 500 MG tablet Commonly known as:  ZITHROMAX Take 1 tablet (500 mg total) by mouth daily with supper for 3 days.   carvedilol 12.5 MG tablet Commonly known as:  COREG Take 1 tablet (12.5 mg total) by mouth 2 (two) times daily with a meal. What changed:    medication strength  See the new instructions.   cefdinir 300 MG capsule Commonly known as:  OMNICEF Take 1 capsule (300 mg total) by mouth daily at 6 PM for 3 days.   feeding supplement (ENSURE ENLIVE) Liqd Take 237 mLs by mouth 2 (two) times daily between meals. What changed:  when to take this   ferrous sulfate 325 (65 FE) MG tablet Take 1 tablet (325 mg total) by mouth 2 (two) times daily with a meal.   hydrALAZINE 50 MG tablet Commonly known as:  APRESOLINE Take 1 tablet (50 mg total) by mouth 3 (three) times daily. What changed:    medication strength  how much to take   multivitamin Tabs tablet Take 1 tablet by mouth at bedtime.       Discharge  Instructions: Please refer to Patient Instructions section of EMR for full details.  Patient was counseled important signs and symptoms that should prompt return to medical care, changes in medications, dietary instructions, activity restrictions, and follow up appointments.   Follow-Up Appointments: Contact information for after-discharge care    Destination    [facility] Preferred SNF .   Service:  Skilled Nursing Contact information: [address] [place] [phone]              Benay Pike, MD 05/07/2018, 2:41 PM PGY-1, [place]

## 2018-05-01 NOTE — Clinical Social Work Note (Addendum)
CSW informed by Tye [place], admissions facility liaison with [facility] that authorization received. CSW talked with MD and advised that patient will not discharge today as nephrology involved and there will be conversation with patient and family regarding the renal issues. Per MD, patient will probably be here through weekend. CSW visited room and talked with patient and granddaughter Jennifer Hinton in the room and talked with daughter Jennifer Hinton by phone regarding facility receiving insurance authorization and patient not discharging yet. CSW will continue to follow, provide SW intervention services as needed and facilitate discharge to [facility] when medically stable.  Jennifer Hinton, MSW, LCSW Licensed Clinical Social Worker [place] [phone]

## 2018-05-01 NOTE — Progress Notes (Signed)
Nutrition Follow-up  DOCUMENTATION CODES:   Severe malnutrition in context of chronic illness  INTERVENTION:   Continue Ensure Enlive po BID, each supplement provides 350 kcal and 20 grams of protein  Continue Rena-Vit  NUTRITION DIAGNOSIS:   Severe Malnutrition related to chronic illness as evidenced by energy intake < 75% for > or equal to 3 months, moderate fat depletion, severe muscle depletion.  Being addressed via supplements  GOAL:   Patient will meet greater than or equal to 90% of their needs  Progressing   MONITOR:   PO intake, Supplement acceptance, Diet advancement, Labs, Weight trends  REASON FOR ASSESSMENT:   Consult Assessment of nutrition requirement/status  ASSESSMENT:   83 yo female, admitted for surgery to treat chronic GI bleeding. PMH significant for acute blood loss anemia, A-fib, chronic diastolic CHF, HLD, HTN, AKI on CKD stage 4.   Noted palliative care consult for goals of care discussion  Pt reports appetite is getting better; ate most of sandwich and ice cream at lunch today. Reports she drank Ensure this AM. Recorded po intake mostly 50-100% of meals  Labs: sodium 131 (L), Creatinine 3.94, BUN 81, phosphorus 4.9 Meds: Rena-Vit   Diet Order:   Diet Order            Diet Heart Room service appropriate? Yes; Fluid consistency: Thin  Diet effective now              EDUCATION NEEDS:   Education needs have been addressed  Skin:  Skin Assessment: Reviewed RN Assessment  Last BM:  3/10  Height:   Ht Readings from Last 1 Encounters:  04/27/18 5\' 4"  (1.626 m)    Weight:   Wt Readings from Last 1 Encounters:  04/30/18 62.7 kg    Ideal Body Weight:  54.5 kg  BMI:  Body mass index is 23.73 kg/m.  Estimated Nutritional Needs:   Kcal:  1425-1710 (25-30 kcal/kg)  Protein:  68-86 gm (1.2-1.5 g/kg)  Fluid:  </= 1.5 L daily or per MD   Kerman Passey MS, RD, LDN, CNSC [phone] Pager  [phone] Weekend/On-Call  Pager

## 2018-05-01 NOTE — Progress Notes (Signed)
Family Medicine Teaching Service Daily Progress Note Intern Pager: [phone]  Patient name: Jennifer Hinton Medical record number: [id number] Date of birth: 06-28-1935 Age: 83 y.o. Gender: female  Primary Care Provider: [name], MD Consultants: GI Code Status: Full code  Pt Overview and Major Events to Date:  Hospital Day 8 Admitted: 04/23/2018   Assessment and Plan: Jennifer Hinton a 83 y.o.female whopresented with symptomatic anemia 2/2 GI bleed. PMH is significant forHLD, gout, HTN, afib, diastolic CHF, pHTN, CKD IV.   Symptomatic anemia secondary to GI bleed. VS stable.   Has received total of 4 U RBC this hospital stay. Based on capsule study results, GI believes bleeding due to AVMs. Recommending iron and erythropoietin if blood loss stable. If unstable bleeding consider enteroscopy. Hold eliquis indefinitely.  -Monitor Hgb,  - advance diet as tolerated.  - PT/OT eval and treat - recommending SNF and rollating walker, 3-in-1 commode   AKI on CKD stage IV.  Cr decreased yesterday, stable today at 3.95. Phosphate elevated.  Lasix have been held. Cr in December was 1.76.   Renal US showed increased echogenicity in R kidney, no hydronephrosis. Elevated ANCA with positive MPO ab. Patient spoke with nephro, does not want renal biopsy. Positive M spike, consider MM given recent accelaration of worsening CKD along with anemia and globulin gap of > 4. Increase may be a response to diuresis.  Discuss results and management with nephro -holding furosemide currently given increase in Creatinine during admission.  -Complement, UPC pending -MonitorBMP - avoid nephrotoxic medications - F/u UPEP, if able to collect    Hypertension - normotensive today. Had some low BP's overnight.  - amlodipine 10 - carvedilol 25 BID -clonidine 0.2 BID - decrease hydralazine to 50 TID -hydralazine 5mg  Q4 hours PRN for SBP>180 and DBP>110  HFpEF exacerbation 2/2 fluid overload from multiple  transfusions. CT chest showing bilateral pleural effusions R>L c/w CHF.Pt on room air. Weight has increased from 56 to 62kg since stopping lasix.  - hold furosemide 80mg  IV TID -Continue amlodipine 10mg  qd, coreg 25mg  BID,  - decrease hydralazine 100mg  TID to 50 mg TID, clonidine 0.2mg  BID to 0.1mg  BID -nephrology following, appreciate recommendations. See above for IV diureses - CXR to evaluate for volume overloa/pleural effusions.  A. fib - Holding Eliquis in the setting of GI bleed. Do not continue no discharge.  - continue carvedilol  Protein calorie malnutrition -Nutrition consult  FEN/GI: soft diet PPx: IV protonix  Disposition: pending GI work up   Medications: Scheduled Meds: . amLODipine  10 mg Oral Daily  . carvedilol  25 mg Oral BID WC  . cloNIDine  0.2 mg Oral BID  . feeding supplement (ENSURE ENLIVE)  237 mL Oral TID  . hydrALAZINE  100 mg Oral TID  . multivitamin  1 tablet Oral QHS   Continuous Infusions: PRN Meds: acetaminophen **OR** acetaminophen, hydrALAZINE  ================================================= ================================================= Subjective:  No complaints.  No dyspnea.  Denies HA, chest pain, abdominal pain.     Objective: Temp:  [98.3 F (36.8 C)-98.8 F (37.1 C)] 98.3 F (36.8 C) (03/13 0550) Pulse Rate:  [61-76] 76 (03/13 0550) Resp:  [18-20] 18 (03/13 0550) BP: (96-127)/(59-73) 127/73 (03/13 0550) SpO2:  [96 %-98 %] 98 % (03/13 0550) Weight:  [62.7 kg] 62.7 kg (03/12 2108) Intake/Output 03/12 0701 - 03/13 0700 In: 770 [P.O.:770] Out: 450 [Urine:450] Physical Exam:  Gen: NAD, alert and oriented. cachectic CV: Regular rate and rhythm.  Normal capillary refill bilaterally.  Radial pulses 2+ bilaterally.  No bilateral lower extremity edema. Resp: basilar crackles on the left side going midway up the lungs, minimal right sided crackles.  No increased work of breathing appreciated. On room air.  Abd: Nontender and  nondistended on palpation to all 4 quadrants.  Positive bowel sounds.  Psych: Cooperative with exam. Pleasant. Makes eye contact.   Laboratory: Recent Labs  Lab 04/28/18 1423 04/29/18 0909 04/30/18 0630  WBC 3.9* 4.4 4.1  HGB 8.2* 7.5* 7.8*  HCT 25.2* 23.6* 24.7*  PLT 161 151 137*   Recent Labs  Lab 04/28/18 0525 04/29/18 0433 04/30/18 0630  NA 132* 133* 132*  K 3.4* 4.0 3.6  CL 106 107 106  CO2 15* 18* 18*  BUN 74* 78* 79*  CREATININE 3.74* 4.05* 3.95*  CALCIUM 7.8* 7.8* 8.0*  PROT 6.1*  --   --   BILITOT 0.7  --   --   ALKPHOS 27*  --   --   ALT 7  --   --   AST 25  --   --   GLUCOSE 125* 116* 111*    Imaging/Diagnostic Tests: No results found.   Benay Pike, MD 05/01/2018, 6:19 AM PGY-1, [place] Intern pager: [phone], text pages welcome

## 2018-05-01 NOTE — Progress Notes (Signed)
[facility] ROUNDING NOTE   Subjective:   Brief history this is an 83 year old lady with a history of atrial fibrillation pulmonary hypertension rheumatoid arthritis and history of GI bleed she has chronic kidney disease with a creatinine has been worsening over the past 4 -5 years.  She underwent capsule endoscopy for possible AVM 04/28/2018.  It appears that she was admitted with symptomatic anemia and GI bleed she received a total of 4 units packed red blood cells she had a colonoscopy 3 12/16/2017 with a single tubular adenoma polyp and EGD 04/25/2018 that was negative.  Her baseline serum creatinine appears to be 1.5-1.7 renal ultrasound showed increased echogenicity within the right kidney with no evidence of hydronephrosis.  Urine output 950 cc 04/30/2018.  Weight increased from 58.4 kg to 62.7 kg  Blood pressure 113/54 pulse 57 temperature 97.9 O2 sats 94% room air  Creatinine has increased from 1.7 baseline    on admission creatinine was 3.66.  This is gradually increased to 4.05 during her hospitalization  Current medications amlodipine 10 mg a day carvedilol 25 mg twice daily clonidine 0.2 mg 2 tablets daily  Protonix 40 mg daily.  Sodium 131 potassium 3.7 chloride 104 CO2 18 BUN 81 creatinine 3.94 glucose 103 calcium 8.1 phosphorus 4.9 Albumin 2.0 WBC 4.2 hemoglobin 8.3 platelets 143   ANA negative ANCA increased to 5.1 with positive myeloperoxidase antibodies 47.2.  Complements C3 57 C4 11 hemoglobin A1c 5.3   urinalysis 21-50 RBCs with protein creatinine ratio 1.54 positive M spike 0.8 g/dL.  Iron saturations 8% 04/23/2018  Abdominal CT showed abdominal and pelvic ascites bilateral pleural effusions.  Chest CT showed moderate cardiomegaly pulmonary artery hypertension with dilated main pulmonary artery ectatic 4.2 cm thoracic aorta   Objective:  Vital signs in last 24 hours:  Temp:  [97.9 F (36.6 C)-98.8 F (37.1 C)] 97.9 F (36.6 C) (03/13 0945) Pulse Rate:   [57-76] 57 (03/13 0945) Resp:  [18-20] 18 (03/13 0945) BP: (112-127)/(54-73) 113/54 (03/13 0945) SpO2:  [94 %-98 %] 94 % (03/13 0945) Weight:  [62.7 kg] 62.7 kg (03/12 2108)  Weight change: 0 kg Filed Weights   04/28/18 2213 04/29/18 2139 04/30/18 2108  Weight: 62 kg 62.7 kg 62.7 kg    Intake/Output: I/O last 3 completed shifts: In: 6468 [P.O.:1190] Out: 1100 [Urine:1100]   Intake/Output this shift:  No intake/output data recorded. Alert nondistressed sitting comfortably in bed CVS- RRR no murmurs rubs gallops RS- CTA no wheezes or rales ABD- BS present soft non-distended EXT- no edema cooperative   Basic Metabolic Panel: Recent Labs  Lab 04/27/18 0623 04/28/18 0525 04/29/18 0433 04/30/18 0630 05/01/18 0555  NA 135 132* 133* 132* 131*  K 4.0 3.4* 4.0 3.6 3.7  CL 112* 106 107 106 104  CO2 16* 15* 18* 18* 18*  GLUCOSE 113* 125* 116* 111* 103*  BUN 76* 74* 78* 79* 81*  CREATININE 3.81* 3.74* 4.05* 3.95* 3.94*  CALCIUM 8.0* 7.8* 7.8* 8.0* 8.1*  PHOS  --   --   --  4.8* 4.9*    Liver Function Tests: Recent Labs  Lab 04/28/18 0525 05/01/18 0555  AST 25  --   ALT 7  --   ALKPHOS 27*  --   BILITOT 0.7  --   PROT 6.1*  --   ALBUMIN 1.9* 2.0*   No results for input(s): LIPASE, AMYLASE in the last 168 hours. No results for input(s): AMMONIA in the last 168 hours.  CBC: Recent Labs  Lab 04/28/18 0525 04/28/18 1423 04/29/18 0909 04/30/18 0630 05/01/18 0555  WBC 3.7* 3.9* 4.4 4.1 4.2  NEUTROABS  --   --  3.7  --   --   HGB 7.5* 8.2* 7.5* 7.8* 8.3*  HCT 24.3* 25.2* 23.6* 24.7* 25.6*  MCV 89.0 88.7 91.1 89.2 88.9  PLT 153 161 151 137* 143*    Cardiac Enzymes: No results for input(s): CKTOTAL, CKMB, CKMBINDEX, TROPONINI in the last 168 hours.  BNP: Invalid input(s): POCBNP  CBG: No results for input(s): GLUCAP in the last 168 hours.  Microbiology: No results found for this or any previous visit.  Coagulation Studies: No results for input(s):  LABPROT, INR in the last 72 hours.  Urinalysis: No results for input(s): COLORURINE, LABSPEC, PHURINE, GLUCOSEU, HGBUR, BILIRUBINUR, KETONESUR, PROTEINUR, UROBILINOGEN, NITRITE, LEUKOCYTESUR in the last 72 hours.  Invalid input(s): APPERANCEUR    Imaging: No results found.   Medications:    . amLODipine  10 mg Oral Daily  . carvedilol  25 mg Oral BID WC  . cloNIDine  0.2 mg Oral BID  . feeding supplement (ENSURE ENLIVE)  237 mL Oral TID  . hydrALAZINE  50 mg Oral TID  . multivitamin  1 tablet Oral QHS   acetaminophen **OR** acetaminophen, hydrALAZINE  Assessment/ Plan:   Acute kidney injury based on chronic kidney disease with a creatinine is increased to over 4.  She now has a nephritic appearing urine sediment with a positive MPO antibody.  I think it would be reasonable to proceed with renal biopsy this was discussed with the patient she did not wish to undergo renal biopsy understanding that we do not know the etiology of her renal disease. Treatment of this condition may be further complicated by the fact of her age.  It may be worth trying her with some high-dose steroids, however would need a biopsy in order to initiate this treatment.  We will not proceed with a renal biopsy at this time as patient is not agreeable.  I discussed this with her primary physician on service.  He is going to involve the family and come up with a plan.  She also has a positive M spike.  This may be serendipitous.  We will continue to avoid nephrotoxins ACE inhibitor's ARB use nonsteroidal anti-inflammatories and Cox 2 inhibitors.  She will not receive any IV contrast  GI blood loss she assistance from Dr. Watt Climes now for capsule endoscopy to evaluate bleeding AVMs.  She has been transfused 4 units packed red blood cells since admission.  She had low iron stores.  Will start Procrit  Hypertension/volume appears to be relatively well controlled at this particular point.  Lasix has been discontinued.  Her  volume status is stable with no evidence of volume overload..  Renal artery stenosis is being considered as a possible etiology.  This does not look to be typical for TTP or HUS.  Severe pulmonary hypertension.  Atrial fibrillation patient is holding anticoagulation secondary to GI bleed  Rheumatoid arthritis appears to be stable.   LOS: [place] @TODAY @10 :06 AM

## 2018-05-02 ENCOUNTER — Encounter (HOSPITAL_COMMUNITY): Payer: Self-pay | Admitting: Primary Care

## 2018-05-02 DIAGNOSIS — N185 Chronic kidney disease, stage 5: Secondary | ICD-10-CM

## 2018-05-02 DIAGNOSIS — Z515 Encounter for palliative care: Secondary | ICD-10-CM

## 2018-05-02 DIAGNOSIS — N19 Unspecified kidney failure: Secondary | ICD-10-CM

## 2018-05-02 DIAGNOSIS — Z7189 Other specified counseling: Secondary | ICD-10-CM

## 2018-05-02 LAB — BASIC METABOLIC PANEL
Anion gap: 12 (ref 5–15)
BUN: 83 mg/dL — ABNORMAL HIGH (ref 8–23)
CALCIUM: 8.3 mg/dL — AB (ref 8.9–10.3)
CO2: 17 mmol/L — ABNORMAL LOW (ref 22–32)
Chloride: 103 mmol/L (ref 98–111)
Creatinine, Ser: 3.85 mg/dL — ABNORMAL HIGH (ref 0.44–1.00)
GFR calc Af Amer: 12 mL/min — ABNORMAL LOW (ref >60)
GFR calc non Af Amer: 10 mL/min — ABNORMAL LOW (ref >60)
Glucose, Bld: 116 mg/dL — ABNORMAL HIGH (ref 70–99)
Potassium: 3.6 mmol/L (ref 3.5–5.1)
Sodium: 132 mmol/L — ABNORMAL LOW (ref 135–145)

## 2018-05-02 LAB — CBC
HCT: 25.9 % — ABNORMAL LOW (ref 36.0–46.0)
Hemoglobin: 8.5 g/dL — ABNORMAL LOW (ref 12.0–15.0)
MCH: 29 pg (ref 26.0–34.0)
MCHC: 32.8 g/dL (ref 30.0–36.0)
MCV: 88.4 fL (ref 80.0–100.0)
Platelets: 150 10*3/uL (ref 150–400)
RBC: 2.93 MIL/uL — ABNORMAL LOW (ref 3.87–5.11)
RDW: 17.5 % — AB (ref 11.5–15.5)
WBC: 4.2 10*3/uL (ref 4.0–10.5)
nRBC: 0 % (ref 0.0–0.2)

## 2018-05-02 LAB — PHOSPHORUS: Phosphorus: 4.7 mg/dL — ABNORMAL HIGH (ref 2.5–4.6)

## 2018-05-02 MED ORDER — CARVEDILOL 12.5 MG PO TABS
12.5000 mg | ORAL_TABLET | Freq: Two times a day (BID) | ORAL | Status: DC
  Administered 2018-05-02 – 2018-05-08 (×13): 12.5 mg via ORAL
  Filled 2018-05-02 (×13): qty 1

## 2018-05-02 MED ORDER — CLONIDINE HCL 0.1 MG PO TABS
0.1000 mg | ORAL_TABLET | Freq: Every day | ORAL | Status: DC
  Administered 2018-05-03 – 2018-05-07 (×5): 0.1 mg via ORAL
  Filled 2018-05-02 (×5): qty 1

## 2018-05-02 NOTE — Progress Notes (Signed)
Family Medicine Teaching Service Daily Progress Note Intern Pager: [phone]  Patient name: Jennifer Hinton Medical record number: [id number] Date of birth: Feb 20, 1935 Age: 83 y.o. Gender: female  Primary Care Provider: [name], MD Consultants: GI Code Status: Full code  Pt Overview and Major Events to Date:  Hospital Day 9 Admitted: 04/23/2018   Assessment and Plan: Roselina Burgueno a 83 y.o.female whopresented with symptomatic anemia 2/2 GI bleed. PMH is significant forHLD, gout, HTN, afib, diastolic CHF, pHTN, CKD IV.   Symptomatic anemia secondary to GI bleed.  Per patient she is unaware of any further bleeding, vitals have remained stable.  Morning Hgb stable at 8.5.  Has received total of 4 U RBC this hospital stay. Based on capsule study results on 04/28/2018, GI believes bleeding due to AVMs. Recommending iron and erythropoietin if blood loss stable. If unstable bleeding consider enteroscopy. Continue to hold eliquis indefinitely. SCDs on for ppx currently.  - continue to monitor Hgb on daily CBC  - PT/OT eval and treat - recommending SNF and rollating walker, 3-in-1 commode   AKI on CKD stage IV.  SCr mildly improved to 3.85 from 3.94 yesterday. Per nephrology, lasix has been discontinued.  Renal US showed increased echogenicity in R kidney, no hydronephrosis. Elevated ANCA with positive MPO antibody. Positive M spike, consider MM given recent acceleration of worsening CKD along with anemia and globulin gap of > 4.  C3 and C4 complements are low.  Patient has had a discussion with nephrology Dr. Justin Mend regarding these results and does not want renal biopsy so would not initiate high dose steroids at this time.  Will plan for a palliative care meeting with family to discuss [place].  Palliative has been consulted.  - holding furosemide  - UPEP/24h light chains pending  - MonitorBMP - avoid nephrotoxic medications  Hypertension - Normotensive, BP this AM 128/86. Continuing home  meds.  - amlodipine 10 mg - carvedilol 25 mg BID -clonidine 0.1 mg BID -hydralazine 50 mg TID -hydralazine 5mg  Q4 hours PRN for SBP>180 and DBP>110  HFpEF exacerbation 2/2 fluid overload from multiple transfusions. Stable volume status without evidence of overload this morning.  CT chest showing bilateral pleural effusions R>L c/w CHF.  Pt remains on room air satting 99%.  Weight has increased from 56 to 62 kg since stopping lasix. No weight recorded for today, will ask nursing staff to document.  UOP 1L over last 24 hours.  CXR from 3/13 with interval mild patchy atelectasis and stable cardiomegaly with mild changes of CHF.  - cont to hold Lasix in setting of worsening SCr  - Continue amlodipine 10mg  qd, coreg 25mg  BID  - hydralazine 50 mg TID, clonidine 0.1mg  BID - nephrology following, appreciate recommendations   A. fib - Holding Eliquis in the setting of GI bleed. Do not continue on discharge.  - continue carvedilol 25 mg BID   Protein calorie malnutrition -Nutrition consulted -cont Ensure supplements TID   FEN/GI: heart healthy  PPx: SCDs (holding eliquis)   Disposition: pending clinical improvement, palliative meeting with family for [place] discussion   Medications: Scheduled Meds: . amLODipine  10 mg Oral Daily  . carvedilol  25 mg Oral BID WC  . cloNIDine  0.1 mg Oral BID  . feeding supplement (ENSURE ENLIVE)  237 mL Oral TID  . hydrALAZINE  50 mg Oral TID  . multivitamin  1 tablet Oral QHS   Continuous Infusions: PRN Meds: acetaminophen **OR** acetaminophen, hydrALAZINE  Subjective:  Patient states she does  not know of any further episodes of bleeding.  She feels well overall, no complaints.  No acute events noted overnight. Denies SOB, CP.    Objective: Temp:  [97.7 F (36.5 C)-99 F (37.2 C)] 97.7 F (36.5 C) (03/14 0755) Pulse Rate:  [57-76] 69 (03/14 0755) Resp:  [18] 18 (03/14 0551) BP: (113-128)/(54-71) 115/71 (03/14 0755) SpO2:  [94 %-99 %] 99 %  (03/14 0755) Intake/Output 03/13 0701 - 03/14 0700 In: 830 [P.O.:830] Out: 1000 [Urine:1000]   Physical Exam:  Gen: Sitting up in bed, cooperative with exam. NAD.  CV: RRR no MRG  Resp: Normal work of breathing on room air. No acute distress.   Abd: soft, NTND, +bs   Psych: Normal mood and affect, pleasant   Laboratory: Recent Labs  Lab 04/30/18 0630 05/01/18 0555 05/02/18 0646  WBC 4.1 4.2 4.2  HGB 7.8* 8.3* 8.5*  HCT 24.7* 25.6* 25.9*  PLT 137* 143* 150   Recent Labs  Lab 04/28/18 0525  04/30/18 0630 05/01/18 0555 05/02/18 0646  NA 132*   < > 132* 131* 132*  K 3.4*   < > 3.6 3.7 3.6  CL 106   < > 106 104 103  CO2 15*   < > 18* 18* 17*  BUN 74*   < > 79* 81* 83*  CREATININE 3.74*   < > 3.95* 3.94* 3.85*  CALCIUM 7.8*   < > 8.0* 8.1* 8.3*  PROT 6.1*  --   --   --   --   BILITOT 0.7  --   --   --   --   ALKPHOS 27*  --   --   --   --   ALT 7  --   --   --   --   AST 25  --   --   --   --   GLUCOSE 125*   < > 111* 103* 116*   < > = values in this interval not displayed.   Imaging/Diagnostic Tests: Dg Chest 2 View  Result Date: 05/01/2018 CLINICAL DATA:  Shortness of breath. Fluid overload. EXAM: CHEST - 2 VIEW COMPARISON:  Chest radiograph dated 04/23/2018 and chest CT dated 04/25/2018. FINDINGS: Stable enlarged cardiac silhouette. Interval small areas of mild patchy opacity in both mid lung zones and left lower lobe. Moderate diffuse peribronchial thickening with mild progression. Small bilateral pleural effusions. Thoracic spine and right shoulder degenerative changes. Diffuse osteopenia. IMPRESSION: 1. Interval mild patchy atelectasis, pneumonia or focal alveolar edema in both mid lung zones and left lower lobe. 2. Mildly progressive bronchitic changes. 3. Stable cardiomegaly and mild changes of congestive heart failure. Electronically Signed   By: Claudie Revering M.D.   On: 05/01/2018 13:12   Lovenia Kim, MD 05/02/2018, 8:25 AM PGY-3, [place] Intern pager: [phone], text pages welcome

## 2018-05-02 NOTE — Consult Note (Signed)
Consultation Note Date: 05/02/2018   Patient Name: Jennifer Hinton  DOB: [birth year]  MRN: [id number]  Age / Sex: 83 y.o., female  PCP: [name], MD Referring Physician: Zenia Resides, MD  Reason for Consultation: Establishing goals of care and Psychosocial/spiritual support  HPI/Patient Profile: 83 y.o. female  with past medical history of rheumatoid arthritis, diastolic heart failure EF 55 to 60%, chronic kidney disease stage IV, hyperlipidemia, gout, hypertension, atrial fib, severe tricuspid regurgitation, severe pulmonary hypertension, symptomatic anemia admitted on 04/23/2018 with increased weakness.  Patient was seen in the internal medicine clinic and was found to have a hemoglobin of 4.5.  Patient has a history of chronic/subacute GI bleed.  She was hospitalized in October 2019 with similar presentation and required 4 units.  She has received 4 units since being admitted on 04/23/2018.  In addition to anemia, her chronic kidney disease has now progressed to stage V.  Her creatinine as of 05/02/2018, is 3.85 (up from baseline of 1.5-1.7).  Consult ordered for goals of care..   Clinical Assessment and Goals of Care: Patient seen, chart reviewed.  Multiple family members present in the room including 2 of 3 daughters, Kennyth Lose and Hoyle Sauer, and Margreta Journey, her granddaughter with whom she lives.  Patient keeps her eyes closed for the majority of our visit. She will answer questions when asked, but otherwise is not conversant.  Her answers do appear relevant but at times she states "I do not know, I do not know".  She does verbalize feeling better.  She denies shortness of breath or pain  Per Dr. Justin Mend, nephrology, note, renal biopsy was recommended and patient has declined.  He also spoke to her regarding hemodialysis and she stated that she would not want to pursue that.  Unfortunately her family was not in the  room with her and have many questions regarding what has been going on with her in terms of her worsening renal failure, anemia.  Introduced palliative medicine services as an additional resource and source of support for people with potentially life limiting disease.  Introduced the concept of goals of care in terms of guiding clinical care and keeping with individuals goal, quality of life.  Did begin CODE STATUS discussion.  Patient is listed as a full code, and as of now,this is by choice versus default  Patient is capable of answering brief simple questions but I am not clear if she fully understands the complexity of her underlying illness in the setting of advanced age, worsening renal function, GI bleed, a-fib now unable to receive anticoagulation, and potentially new multiple myeloma    SUMMARY OF RECOMMENDATIONS   Continue full code for now Family meeting scheduled with Dr. Justin Mend, nephrology, for 05/03/2018 at 10:30 AM.  Issues to be addressed would be renal biopsy, hemodialysis and what to expect going forward Code Status/Advance Care Planning:  Full code   Palliative Prophylaxis:   Aspiration, Bowel Regimen, Delirium Protocol, Eye Care, Frequent Pain Assessment, Oral Care and Turn Reposition  Additional Recommendations (Limitations, Scope,  Preferences):  Full Scope Treatment  Psycho-social/Spiritual:   Desire for further Chaplaincy support:no  Prognosis:   Unable to determine  Discharge Planning: To Be Determined      Primary Diagnoses: Present on Admission: **None**   I have reviewed the medical record, interviewed the patient and family, and examined the patient. The following aspects are pertinent.  Past Medical History:  Diagnosis Date  . Anemia due to blood loss, acute 11/2017  . Atrial fibrillation, permanent   . Chronic diastolic CHF (congestive heart failure) ([place])   . Hyperlipidemia   . Hypertension   . Pulmonary hypertension ([place])   . RA  (rheumatoid arthritis) ([facility])    Social History   Socioeconomic History  . Marital status: Widowed    Spouse name: Not on file  . Number of children: 6  . Years of education: 9th grade   . Highest education level: Not on file  Occupational History  . 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 Financial resource strain: Not on file  . Food insecurity:    Worry: Not on file    Inability: Not on file  . Transportation needs:    Medical: Not on file    Non-medical: Not on file  Tobacco Use  . Smoking status: Never Smoker  . Smokeless tobacco: Never Used  Substance and Sexual Activity  . Alcohol use: No  . Drug use: No  . Sexual activity: Not on file  Lifestyle  . Physical activity:    Days per week: Not on file    Minutes per session: Not on file  . Stress: Not on file  Relationships  . Social connections:    Talks on phone: Not on file    Gets together: Not on file    Attends religious service: Not on file    Active member of club or organization: Not on file    Attends meetings of clubs or organizations: Not on file    Relationship status: Not on file  Other Topics Concern  . Not on file  Social History Narrative   Had 6 children.   5 children living (oldest daughter passed away).    2 living in [place], 2 living in [place], 1 living in [place].       Raising your 10 yo great grandson Dorris Fetch, also a clinic patient).    Drives. Recently renewed license. Does not own a car so transportation is sometimes a limiting factor.             Family History  Problem Relation Age of Onset  . Heart disease Brother   . Hyperlipidemia Brother    Scheduled Meds: . amLODipine  10 mg Oral Daily  . carvedilol  12.5 mg Oral BID WC  . [START ON 05/03/2018] cloNIDine  0.1 mg Oral Daily  . feeding supplement (ENSURE ENLIVE)  237 mL Oral TID  . hydrALAZINE  50 mg Oral TID  . multivitamin   1 tablet Oral QHS   Continuous Infusions: PRN Meds:.acetaminophen **OR** acetaminophen, hydrALAZINE Medications Prior to Admission:  Prior to Admission medications   Medication Sig Start Date End Date Taking? Authorizing Provider  acetaminophen (TYLENOL) 650 MG CR tablet Take 650 mg by mouth as needed for pain (knee pain).    Yes [provider]  amLODipine (NORVASC) 10 MG tablet TAKE 1 TABLET  BY MOUTH EVERY DAY Patient taking differently: Take 10 mg by mouth daily.  12/23/17  Yes Lelon Perla, MD  carvedilol (COREG) 25 MG tablet TAKE 1 TABLET BY MOUTH TWICE A DAY WITH A MEAL. Patient taking differently: Take 25 mg by mouth 2 (two) times daily with a meal.  03/18/18  Yes Meng, Hao, PA  ELIQUIS 2.5 MG TABS tablet TAKE 1 TABLET BY MOUTH TWICE A DAY Patient taking differently: Take 2.5 mg by mouth 2 (two) times daily.  02/25/18  Yes Diallo, Abdoulaye, MD  feeding supplement, ENSURE ENLIVE, (ENSURE ENLIVE) LIQD Take 237 mLs by mouth 2 (two) times daily between meals. Patient taking differently: Take 237 mLs by mouth daily.  12/19/17  Yes Debbe Odea, MD  ferrous sulfate 325 (65 FE) MG tablet Take 1 tablet (325 mg total) by mouth 2 (two) times daily with a meal. 12/17/17  Yes Rizwan, Eunice Blase, MD  furosemide (LASIX) 40 MG tablet Take 1 tablet (40 mg total) by mouth as needed for fluid (take if you gain 3lbs in a day or 5lbs in a week.). Patient taking differently: Take 20-40 mg by mouth as needed for fluid (take if you gain 3lbs in a day or 5lbs in a week.).  02/03/18 05/04/18 Yes Barrett, Evelene Croon, PA-C  hydrALAZINE (APRESOLINE) 100 MG tablet TAKE 1 TABLET (100 MG TOTAL) BY MOUTH 3 (THREE) TIMES DAILY. 03/20/18  Yes Lelon Perla, MD  bisacodyl (DULCOLAX) 10 MG suppository Place 1 suppository (10 mg total) rectally daily as needed for moderate constipation. Patient not taking: Reported on 02/03/2018 12/19/17   Debbe Odea, MD  polyethylene glycol [facility]) packet Take 17 g by mouth  daily as needed for mild constipation. Patient not taking: Reported on 02/03/2018 12/19/17   Debbe Odea, MD  senna (SENOKOT) 8.6 MG TABS tablet Take 1 tablet (8.6 mg total) by mouth at bedtime as needed for mild constipation. Patient not taking: Reported on 02/03/2018 12/19/17   Debbe Odea, MD   No Known Allergies Review of Systems  Unable to perform ROS: Other    Physical Exam Vitals signs and nursing note reviewed.  Constitutional:      Appearance: She is ill-appearing.     Comments: Elderly female; keeps her eyes closed and will answer questions when asked Frequently states "I do not know"  HENT:     Head: Normocephalic and atraumatic.  Cardiovascular:     Rate and Rhythm: Normal rate. Rhythm irregular.  Pulmonary:     Effort: Pulmonary effort is normal.  Musculoskeletal: Normal range of motion.  Skin:    General: Skin is warm and dry.  Neurological:     Comments: Oriented to herself and that she is in the hospital.  Poor grasp of underlying medical conditions  Psychiatric:     Comments: Affect constricted.  No overt agitation otherwise unable to test     Vital Signs: BP 114/63   Pulse 64   Temp 97.7 F (36.5 C) (Oral)   Resp 16   Ht _0  (1.626 m)   Wt 62.7 kg   SpO2 99%   BMI 23.73 kg/m  Pain Scale: 0-10   Pain Score: 0-No pain   SpO2: SpO2: 99 % O2 Device:SpO2: 99 % O2 Flow Rate: .O2 Flow Rate (L/min): 2 L/min  IO: Intake/output summary:   Intake/Output Summary (Last 24 hours) at 05/02/2018 1548 Last data filed at 05/02/2018 1540 Gross per 24 hour  Intake 502 ml  Output 800 ml  Net -298  ml    LBM: Last BM Date: 05/01/18 Baseline Weight: Weight: 57.6 kg Most recent weight: Weight: 62.7 kg     Palliative Assessment/Data:   Flowsheet Rows     Most Recent Value  Intake Tab  Referral Department  Hospitalist  Unit at Time of Referral  Med/Surg Unit  Palliative Care Primary Diagnosis  Nephrology  Date Notified  05/01/18  Palliative Care Type   New Palliative care  Reason for referral  Psychosocial or Spiritual support, Clarify Goals of Care  Date of Admission  04/23/18  Date first seen by Palliative Care  05/02/18  # of days Palliative referral response time  1 Day(s)  # of days IP prior to Palliative referral  8  Clinical Assessment  Palliative Performance Scale Score  30%  Pain Max last 24 hours  Not able to report  Pain Min Last 24 hours  Not able to report  Dyspnea Max Last 24 Hours  Not able to report  Dyspnea Min Last 24 hours  Not able to report  Nausea Max Last 24 Hours  Not able to report  Nausea Min Last 24 Hours  Not able to report  Anxiety Max Last 24 Hours  Not able to report  Anxiety Min Last 24 Hours  Not able to report  Other Max Last 24 Hours  Not able to report  Psychosocial & Spiritual Assessment  Palliative Care Outcomes  Patient/Family meeting held?  Yes  Who was at the meeting?  pt, dtrs Kennyth Lose and Hoyle Sauer, granddaughter [place]  Provided psychosocial or spiritual support  Palliative Care follow-up planned  Yes, Facility      Time In: 1430 Time Out: 6226 Time Total: 75 min Greater than 50%  of this time was spent counseling and coordinating care related to the above assessment and plan. Staffed with Dr. Justin Mend  Signed by: Dory Horn, NP   Please contact Palliative Medicine Team phone at [phone] for questions and concerns.  For individual provider: See Shea Evans

## 2018-05-02 NOTE — Progress Notes (Signed)
[facility] ROUNDING NOTE   Subjective:   Brief history this is an 83 year old lady with a history of atrial fibrillation pulmonary hypertension rheumatoid arthritis and history of GI bleed she has chronic kidney disease with a creatinine has been worsening over the past 4 -5 years.  She underwent capsule endoscopy for possible AVM 04/28/2018.  It appears that she was admitted with symptomatic anemia and GI bleed she received a total of 4 units packed red blood cells she had a colonoscopy 3 12/16/2017 with a single tubular adenoma polyp and EGD 04/25/2018 that was negative.  Her baseline serum creatinine appears to be 1.5-1.7 renal ultrasound showed increased echogenicity within the right kidney with no evidence of hydronephrosis.  Blood pressure 117/62 pulse 64 temperature 97.4 O2 sats 99%    Urine output 600 cc 05/01/2018  Sodium 132 potassium 3.6 chloride 103 CO2 17 BUN 83 creatinine 3.85 glucose 116 calcium 8.3 phosphorus 4.9 albumin 2.0 WBC seven 4.2 hemoglobin 8.5 platelets 150  Current medications amlodipine 10 mg a day carvedilol 25 mg twice daily clonidine 0.2 mg 2 tablets daily  Protonix 40 mg daily.   ANA negative ANCA increased to 5.1 with positive myeloperoxidase antibodies 47.2.  Complements C3 57 C4 11 hemoglobin A1c 5.3   urinalysis 21-50 RBCs with protein creatinine ratio 1.54 positive M spike 0.8 g/dL.  Iron saturations 8% 04/23/2018  Abdominal CT showed abdominal and pelvic ascites bilateral pleural effusions.  Chest CT showed moderate cardiomegaly pulmonary artery hypertension with dilated main pulmonary artery ectatic 4.2 cm thoracic aorta   Objective:  Vital signs in last 24 hours:  Temp:  [97.7 F (36.5 C)-99 F (37.2 C)] 97.7 F (36.5 C) (03/14 0755) Pulse Rate:  [62-76] 64 (03/14 0949) Resp:  [16-18] 16 (03/14 0949) BP: (113-128)/(54-71) 117/62 (03/14 0949) SpO2:  [94 %-99 %] 99 % (03/14 0755)  Weight change:  Filed Weights   04/28/18 2213 04/29/18  2139 04/30/18 2108  Weight: 62 kg 62.7 kg 62.7 kg    Intake/Output: I/O last 3 completed shifts: In: 1050 [P.O.:1050] Out: 1000 [Urine:1000]   Intake/Output this shift:  No intake/output data recorded. Alert nondistressed sitting comfortably in bed CVS- RRR no murmurs rubs gallops RS- CTA no wheezes or rales ABD- BS present soft non-distended EXT- no edema cooperative   Basic Metabolic Panel: Recent Labs  Lab 04/28/18 0525 04/29/18 0433 04/30/18 0630 05/01/18 0555 05/02/18 0646  NA 132* 133* 132* 131* 132*  K 3.4* 4.0 3.6 3.7 3.6  CL 106 107 106 104 103  CO2 15* 18* 18* 18* 17*  GLUCOSE 125* 116* 111* 103* 116*  BUN 74* 78* 79* 81* 83*  CREATININE 3.74* 4.05* 3.95* 3.94* 3.85*  CALCIUM 7.8* 7.8* 8.0* 8.1* 8.3*  PHOS  --   --  4.8* 4.9* 4.7*    Liver Function Tests: Recent Labs  Lab 04/28/18 0525 05/01/18 0555  AST 25  --   ALT 7  --   ALKPHOS 27*  --   BILITOT 0.7  --   PROT 6.1*  --   ALBUMIN 1.9* 2.0*   No results for input(s): LIPASE, AMYLASE in the last 168 hours. No results for input(s): AMMONIA in the last 168 hours.  CBC: Recent Labs  Lab 04/28/18 1423 04/29/18 0909 04/30/18 0630 05/01/18 0555 05/02/18 0646  WBC 3.9* 4.4 4.1 4.2 4.2  NEUTROABS  --  3.7  --   --   --   HGB 8.2* 7.5* 7.8* 8.3* 8.5*  HCT 25.2* 23.6*  24.7* 25.6* 25.9*  MCV 88.7 91.1 89.2 88.9 88.4  PLT 161 151 137* 143* 150    Cardiac Enzymes: No results for input(s): CKTOTAL, CKMB, CKMBINDEX, TROPONINI in the last 168 hours.  BNP: Invalid input(s): POCBNP  CBG: No results for input(s): GLUCAP in the last 168 hours.  Microbiology: No results found for this or any previous visit.  Coagulation Studies: No results for input(s): LABPROT, INR in the last 72 hours.  Urinalysis: No results for input(s): COLORURINE, LABSPEC, PHURINE, GLUCOSEU, HGBUR, BILIRUBINUR, KETONESUR, PROTEINUR, UROBILINOGEN, NITRITE, LEUKOCYTESUR in the last 72 hours.  Invalid input(s):  APPERANCEUR    Imaging: Dg Chest 2 View  Result Date: 05/01/2018 CLINICAL DATA:  Shortness of breath. Fluid overload. EXAM: CHEST - 2 VIEW COMPARISON:  Chest radiograph dated 04/23/2018 and chest CT dated 04/25/2018. FINDINGS: Stable enlarged cardiac silhouette. Interval small areas of mild patchy opacity in both mid lung zones and left lower lobe. Moderate diffuse peribronchial thickening with mild progression. Small bilateral pleural effusions. Thoracic spine and right shoulder degenerative changes. Diffuse osteopenia. IMPRESSION: 1. Interval mild patchy atelectasis, pneumonia or focal alveolar edema in both mid lung zones and left lower lobe. 2. Mildly progressive bronchitic changes. 3. Stable cardiomegaly and mild changes of congestive heart failure. Electronically Signed   By: Claudie Revering M.D.   On: 05/01/2018 13:12     Medications:    . amLODipine  10 mg Oral Daily  . carvedilol  25 mg Oral BID WC  . cloNIDine  0.1 mg Oral BID  . feeding supplement (ENSURE ENLIVE)  237 mL Oral TID  . hydrALAZINE  50 mg Oral TID  . multivitamin  1 tablet Oral QHS   acetaminophen **OR** acetaminophen, hydrALAZINE  Assessment/ Plan:   Acute kidney injury based on chronic kidney disease with a creatinine is increased to over 4.  She now has a nephritic appearing urine sediment with a positive MPO antibody.  I think it would be reasonable to proceed with renal biopsy this was discussed with the patient she did not wish to undergo renal biopsy understanding that we do not know the etiology of her renal disease. Treatment of this condition may be further complicated by the fact of her age.  It may be worth trying her with some high-dose steroids, however would need a biopsy in order to initiate this treatment.  We will not proceed with a renal biopsy at this time as patient is not agreeable.  I discussed this with her primary physician on service.  He is going to involve the family and come up with a plan.   She also has a positive M spike.  This may be serendipitous.  We will continue to avoid nephrotoxins ACE inhibitor's ARB use nonsteroidal anti-inflammatories and Cox 2 inhibitors.  She will not receive any IV contrast  GI blood loss she assistance from Dr. Watt Climes now for capsule endoscopy to evaluate bleeding AVMs.  She has been transfused 4 units packed red blood cells since admission.  She had low iron stores.  Will start Procrit  Hypertension/volume appears to be relatively well controlled at this particular point.  Lasix has been discontinued.  I will decrease the dose of her clonidine to 0.1 mg daily and decrease Coreg to 12.5 mg twice daily  Severe pulmonary hypertension.  Atrial fibrillation patient is holding anticoagulation secondary to GI bleed  Rheumatoid arthritis appears to be stable.   LOS: [place] @TODAY @11 :45 AM

## 2018-05-03 ENCOUNTER — Inpatient Hospital Stay (HOSPITAL_COMMUNITY): Payer: Medicare Other

## 2018-05-03 LAB — URINALYSIS, COMPLETE (UACMP) WITH MICROSCOPIC
Bilirubin Urine: NEGATIVE
Glucose, UA: NEGATIVE mg/dL
Ketones, ur: NEGATIVE mg/dL
NITRITE: NEGATIVE
Protein, ur: 30 mg/dL — AB
Specific Gravity, Urine: 1.013 (ref 1.005–1.030)
pH: 8 (ref 5.0–8.0)

## 2018-05-03 LAB — CBC
HCT: 24.4 % — ABNORMAL LOW (ref 36.0–46.0)
Hemoglobin: 8.1 g/dL — ABNORMAL LOW (ref 12.0–15.0)
MCH: 28.9 pg (ref 26.0–34.0)
MCHC: 33.2 g/dL (ref 30.0–36.0)
MCV: 87.1 fL (ref 80.0–100.0)
NRBC: 0 % (ref 0.0–0.2)
Platelets: 158 10*3/uL (ref 150–400)
RBC: 2.8 MIL/uL — ABNORMAL LOW (ref 3.87–5.11)
RDW: 17.2 % — ABNORMAL HIGH (ref 11.5–15.5)
WBC: 4.5 10*3/uL (ref 4.0–10.5)

## 2018-05-03 MED ORDER — SODIUM CHLORIDE 0.9 % IV SOLN
1.0000 g | INTRAVENOUS | Status: DC
  Administered 2018-05-03: 1 g via INTRAVENOUS
  Filled 2018-05-03 (×2): qty 1

## 2018-05-03 NOTE — Progress Notes (Signed)
FPTS Interim Progress Note   had an extensive conversation with patient, her family, palliative care, and nephrology regarding patient's goals of care.  Patient has decided not to pursue further workup of her kidney disease.  This includes workup of possible multiple myeloma, vasculitis, or any other potential cause.  This was decided after weighing the benefits of treatment versus the harms involved in diagnosis and treatment.  Plan is to discharge to SNF for short term rehabilitation, and then initiate home hospice care.  Medical management of her kidney failure to control electrolyte imbalances and acid-base imbalance can still be considered but patient does not want dialysis and nephrology agrees this would not be in the best interest of the patient.  After discussion with the patient, she has decided to be switched to DNR code status.    Benay Pike, MD 05/03/2018, 11:28 AM PGY-1, [facility] Medicine Service pager [phone]

## 2018-05-03 NOTE — Progress Notes (Signed)
  Palliative medicine progress note  Patient seen, chart reviewed.  Family meeting scheduled today.  Patient's 3 daughters, Jennifer Hinton as well as 3 granddaughters and 2 grandsons present for today's meeting.  Dr. Jeannine Kitten from family medicine teaching service present as well as Dr. Justin Mend from nephrology.  Good goals of care meeting had as well as extensive clinical discussion regarding what Jennifer Hinton is dealing with.  Family was all in agreement with the following recommendations  Plan  Continue to treat the treatable in the hospital.  Patient spiked a fever last night, blood cultures are pending, family would want to pursue IV antibiotics if warranted  Patient is now a DNR DNI  If she is able to rebound, initial steps are to go to SNF/rehab and then will elect there hospice benefit when skilled days have been exhausted.  Family educated on how to affiliate with a hospice  No further work-up for multiple myeloma for example, no renal biopsy no bone marrow biopsy.  Family understands that treatment of multiple myeloma would be too difficult for [name]  No hemodialysis  Discussed at length with family what hospice can and cannot provide specifically transfusions, IV fluids and IV antibiotics.  We also discussed residential hospice should her care at home become too burdensome  Palliative medicine to stay involved in support of family and patient  I did attempt to prepare family for a prognosis of days to weeks.  Thank you, [name], NP  Time spent: 45 minutes Greater than 50% of spent time spent in counseling and coordination of care

## 2018-05-03 NOTE — Progress Notes (Addendum)
[facility] ROUNDING NOTE   Subjective:   Brief history this is an 83 year old lady with a history of atrial fibrillation pulmonary hypertension rheumatoid arthritis and history of GI bleed she has chronic kidney disease with a creatinine has been worsening over the past 4 -5 years.  She underwent capsule endoscopy for possible AVM 04/28/2018.  It appears that she was admitted with symptomatic anemia and GI bleed she received a total of 4 units packed red blood cells she had a colonoscopy 3 12/16/2017 with a single tubular adenoma polyp and EGD 04/25/2018 that was negative.  Her baseline serum creatinine appears to be 1.5-1.7 renal ultrasound showed increased echogenicity within the right kidney with no evidence of hydronephrosis.  Blood pressure 144/63 pulse 65 temperature 98.4  Urine output 400 cc 05/02/2018  Sodium 132 potassium 3.6 chloride 107 CO2 17 glucose 116 BUN 83 creatinine 3.85 calcium 8.3 phosphorus 4.7 GFR 10 to 12 cc/min  Current medications amlodipine 10 mg a day carvedilol 12.5 mg twice daily clonidine 0.1 mg daily Protonix 40 mg daily.   ANA negative ANCA increased to 5.1 with positive myeloperoxidase antibodies 47.2.  Complements C3 57 C4 11 hemoglobin A1c 5.3   urinalysis 21-50 RBCs with protein creatinine ratio 1.54 positive M spike 0.8 g/dL.  Iron saturations 8% 04/23/2018  Abdominal CT showed abdominal and pelvic ascites bilateral pleural effusions.  Chest CT showed moderate cardiomegaly pulmonary artery hypertension with dilated main pulmonary artery ectatic 4.2 cm thoracic aorta   Objective:  Vital signs in last 24 hours:  Temp:  [98.4 F (36.9 C)-101 F (38.3 C)] 98.4 F (36.9 C) (03/15 0814) Pulse Rate:  [63-77] 65 (03/15 0814) Resp:  [16-18] 16 (03/15 0509) BP: (114-144)/(51-66) 144/63 (03/15 0814) SpO2:  [93 %-100 %] 100 % (03/15 0814)  Weight change:  Filed Weights   04/28/18 2213 04/29/18 2139 04/30/18 2108  Weight: 62 kg 62.7 kg 62.7 kg     Intake/Output: I/O last 3 completed shifts: In: 43 [P.O.:702] Out: 800 [Urine:800]   Intake/Output this shift:  No intake/output data recorded. Alert nondistressed sitting comfortably in bed CVS- RRR no murmurs rubs gallops RS- CTA no wheezes or rales ABD- BS present soft non-distended EXT- no edema cooperative   Basic Metabolic Panel: Recent Labs  Lab 04/28/18 0525 04/29/18 0433 04/30/18 0630 05/01/18 0555 05/02/18 0646  NA 132* 133* 132* 131* 132*  K 3.4* 4.0 3.6 3.7 3.6  CL 106 107 106 104 103  CO2 15* 18* 18* 18* 17*  GLUCOSE 125* 116* 111* 103* 116*  BUN 74* 78* 79* 81* 83*  CREATININE 3.74* 4.05* 3.95* 3.94* 3.85*  CALCIUM 7.8* 7.8* 8.0* 8.1* 8.3*  PHOS  --   --  4.8* 4.9* 4.7*    Liver Function Tests: Recent Labs  Lab 04/28/18 0525 05/01/18 0555  AST 25  --   ALT 7  --   ALKPHOS 27*  --   BILITOT 0.7  --   PROT 6.1*  --   ALBUMIN 1.9* 2.0*   No results for input(s): LIPASE, AMYLASE in the last 168 hours. No results for input(s): AMMONIA in the last 168 hours.  CBC: Recent Labs  Lab 04/29/18 0909 04/30/18 0630 05/01/18 0555 05/02/18 0646 05/03/18 0236  WBC 4.4 4.1 4.2 4.2 4.5  NEUTROABS 3.7  --   --   --   --   HGB 7.5* 7.8* 8.3* 8.5* 8.1*  HCT 23.6* 24.7* 25.6* 25.9* 24.4*  MCV 91.1 89.2 88.9 88.4 87.1  PLT  151 137* 143* 150 158    Cardiac Enzymes: No results for input(s): CKTOTAL, CKMB, CKMBINDEX, TROPONINI in the last 168 hours.  BNP: Invalid input(s): POCBNP  CBG: No results for input(s): GLUCAP in the last 168 hours.  Microbiology: No results found for this or any previous visit.  Coagulation Studies: No results for input(s): LABPROT, INR in the last 72 hours.  Urinalysis: No results for input(s): COLORURINE, LABSPEC, PHURINE, GLUCOSEU, HGBUR, BILIRUBINUR, KETONESUR, PROTEINUR, UROBILINOGEN, NITRITE, LEUKOCYTESUR in the last 72 hours.  Invalid input(s): APPERANCEUR    Imaging: Dg Chest 2 View  Result Date:  05/01/2018 CLINICAL DATA:  Shortness of breath. Fluid overload. EXAM: CHEST - 2 VIEW COMPARISON:  Chest radiograph dated 04/23/2018 and chest CT dated 04/25/2018. FINDINGS: Stable enlarged cardiac silhouette. Interval small areas of mild patchy opacity in both mid lung zones and left lower lobe. Moderate diffuse peribronchial thickening with mild progression. Small bilateral pleural effusions. Thoracic spine and right shoulder degenerative changes. Diffuse osteopenia. IMPRESSION: 1. Interval mild patchy atelectasis, pneumonia or focal alveolar edema in both mid lung zones and left lower lobe. 2. Mildly progressive bronchitic changes. 3. Stable cardiomegaly and mild changes of congestive heart failure. Electronically Signed   By: Claudie Revering M.D.   On: 05/01/2018 13:12     Medications:    . amLODipine  10 mg Oral Daily  . carvedilol  12.5 mg Oral BID WC  . cloNIDine  0.1 mg Oral Daily  . feeding supplement (ENSURE ENLIVE)  237 mL Oral TID  . hydrALAZINE  50 mg Oral TID  . multivitamin  1 tablet Oral QHS   acetaminophen **OR** acetaminophen, hydrALAZINE  Assessment/ Plan:   Acute kidney injury based on chronic kidney disease with a creatinine is increased to over 4.  She now has a nephritic appearing urine sediment with a positive MPO antibody.  I think it would be reasonable to proceed with renal biopsy this was discussed with the patient she did not wish to undergo renal biopsy understanding that we do not know the etiology of her renal disease. Treatment of this condition may be further complicated by the fact of her age.  It may be worth trying her with some high-dose steroids, however would need a biopsy in order to initiate this treatment.  We will not proceed with a renal biopsy at this time as patient is not agreeable.  Hospice has been involved.  Scheduled for family meeting this morning 10 AM.  She also has a positive M spike.  This may be serendipitous.  We will continue to avoid  nephrotoxins ACE inhibitor's ARB use nonsteroidal anti-inflammatories and Cox 2 inhibitors.  She will not receive any IV contrast  GI blood loss she assistance from Dr. Watt Climes now for capsule endoscopy to evaluate bleeding AVMs.  She has been transfused 4 units packed red blood cells since admission.  She had low iron stores.  Will start darbepoetin  Hypertension/volume appears to be relatively well controlled at this particular point.  Blood pressure controlled  Severe pulmonary hypertension.  Atrial fibrillation patient is holding anticoagulation secondary to GI bleed  Rheumatoid arthritis appears to be stable.  Family meeting.  Family unanimously support the patient and the decision not to proceed with aggressive measures.  Appreciate hospice and palliative care consult.  Will sign off from patient thank you very much for most interesting consult  LOS: [place] @TODAY @9 :43 AM

## 2018-05-03 NOTE — Progress Notes (Signed)
Family Medicine Teaching Service Daily Progress Note Intern Pager: [phone]  Patient name: Jennifer Hinton Medical record number: [id number] Date of birth: 1935-05-26 Age: 83 y.o. Gender: female  Primary Care Provider: [name], MD Consultants: GI Code Status: Full code  Pt Overview and Major Events to Date:  Hospital Day 10 Admitted: 04/23/2018   Assessment and Plan: Jennifer Hinton a 83 y.o.female whopresented with symptomatic anemia 2/2 GI bleed. PMH is significant forHLD, gout, HTN, afib, diastolic CHF, pHTN, CKD IV.   AKI on CKD stage IV.  SCr mildly improved to 3.85 from 3.94 . Per nephrology, lasix has been discontinued.  Renal US showed increased echogenicity in R kidney, no hydronephrosis. Elevated ANCA with positive MPO antibody. Positive M spike, consider MM given recent acceleration of worsening CKD along with anemia and globulin gap of > 4.  C3 and C4 complements are low.  Patient has had a discussion with nephrology Dr. Justin Mend regarding these results and does not want renal biopsy so would not initiate high dose steroids at this time.  Will plan for a palliative care meeting with family to discuss [place].  Palliative has been consulted. Family meeting scheduled for 3/15 at 1030am.  - holding furosemide   - MonitorBMP - avoid nephrotoxic medications  Symptomatic anemia secondary to GI bleed. - resolved bleed.    Morning Hgb stable at 8.5.  Has received total of 4 U RBC this hospital stay. Based on capsule study results on 04/28/2018, GI believes bleeding due to AVMs. Recommending iron and erythropoietin if blood loss stable. If unstable bleeding consider enteroscopy. Continue to hold eliquis indefinitely. SCDs on for ppx currently.  - continue to monitor Hgb on daily CBC  - PT/OT eval and treat - recommending SNF and rollating walker, 3-in-1 commode   Hypertension - Normotensive, BP this AM 126/64. Continuing home meds, adjust as needed.   - amlodipine 10 mg - carvedilol  12.5 mg BID -clonidine 0.1 mg BID -hydralazine 50 mg TID -hydralazine 5mg  Q4 hours PRN for SBP>180 and DBP>110  HFpEF exacerbation 2/2 fluid overload from multiple transfusions. Stable volume status without evidence of overload this morning.  CT chest showing bilateral pleural effusions R>L c/w CHF.  Pt remains on room air satting 99%.  Weight has increased from 56 to 62 kg since stopping lasix. No weight recorded for today, will ask nursing staff to document.  UOP 1L over last 24 hours.  CXR from 3/13 with interval mild patchy atelectasis and stable cardiomegaly with mild changes of CHF.  - cont to hold Lasix in setting of worsening SCr  - Continue amlodipine 10mg  qd, coreg 25mg  BID  - hydralazine 50 mg TID, clonidine 0.1mg  BID - nephrology following, appreciate recommendations   Fever - pt. Had fever of 101 on night of 3/14.  Tylenol was given one time. Afebrile since that time. No new complaints.  Physical exam.   - blood cultures - CXR - urine culture, urinalysis - monitor for fevers.   A. fib - Holding Eliquis in the setting of GI bleed. Do not continue on discharge.  - continue carvedilol 25 mg BID   Protein calorie malnutrition -Nutrition consulted -cont Ensure supplements TID   FEN/GI: heart healthy  PPx: SCDs (holding eliquis)   Disposition: pending clinical improvement, palliative meeting with family for [place] discussion   Medications: Scheduled Meds: . amLODipine  10 mg Oral Daily  . carvedilol  12.5 mg Oral BID WC  . cloNIDine  0.1 mg Oral Daily  . feeding  supplement (ENSURE ENLIVE)  237 mL Oral TID  . hydrALAZINE  50 mg Oral TID  . multivitamin  1 tablet Oral QHS   Continuous Infusions: PRN Meds: acetaminophen **OR** acetaminophen, hydrALAZINE  Subjective:  Patient has no complaints.  She does not like her breakfast, which she states is mashed potatoes (it's scrambled eggs).     Objective: Temp:  [97.7 F (36.5 C)-101 F (38.3 C)] 98.9 F (37.2 C)  (03/15 0509) Pulse Rate:  [63-69] 65 (03/15 0509) Resp:  [16-18] 16 (03/15 0509) BP: (114-134)/(51-71) 126/64 (03/15 0509) SpO2:  [93 %-99 %] 98 % (03/15 0509) Intake/Output 03/14 0701 - 03/15 0700 In: 472 [P.O.:472] Out: 400 [Urine:400]   Physical Exam:  Gen: sitting up in bed. No acute distress.     CV: irregularly irregular rhythm. Unusual heart sound heard x 3.  Difficult to describe, almost like a plucking sound.  No edema.   Resp: Normal work of breathing on room air. No acute distress.   Abd: soft, nontender. Normal bowel sounds.  Psych: Normal mood and affect, pleasant   Laboratory: Recent Labs  Lab 05/01/18 0555 05/02/18 0646 05/03/18 0236  WBC 4.2 4.2 4.5  HGB 8.3* 8.5* 8.1*  HCT 25.6* 25.9* 24.4*  PLT 143* 150 158   Recent Labs  Lab 04/28/18 0525  04/30/18 0630 05/01/18 0555 05/02/18 0646  NA 132*   < > 132* 131* 132*  K 3.4*   < > 3.6 3.7 3.6  CL 106   < > 106 104 103  CO2 15*   < > 18* 18* 17*  BUN 74*   < > 79* 81* 83*  CREATININE 3.74*   < > 3.95* 3.94* 3.85*  CALCIUM 7.8*   < > 8.0* 8.1* 8.3*  PROT 6.1*  --   --   --   --   BILITOT 0.7  --   --   --   --   ALKPHOS 27*  --   --   --   --   ALT 7  --   --   --   --   AST 25  --   --   --   --   GLUCOSE 125*   < > 111* 103* 116*   < > = values in this interval not displayed.   Imaging/Diagnostic Tests: Dg Chest 2 View  Result Date: 05/01/2018 CLINICAL DATA:  Shortness of breath. Fluid overload. EXAM: CHEST - 2 VIEW COMPARISON:  Chest radiograph dated 04/23/2018 and chest CT dated 04/25/2018. FINDINGS: Stable enlarged cardiac silhouette. Interval small areas of mild patchy opacity in both mid lung zones and left lower lobe. Moderate diffuse peribronchial thickening with mild progression. Small bilateral pleural effusions. Thoracic spine and right shoulder degenerative changes. Diffuse osteopenia. IMPRESSION: 1. Interval mild patchy atelectasis, pneumonia or focal alveolar edema in both mid lung zones  and left lower lobe. 2. Mildly progressive bronchitic changes. 3. Stable cardiomegaly and mild changes of congestive heart failure. Electronically Signed   By: Claudie Revering M.D.   On: 05/01/2018 13:12   Benay Pike, MD 05/03/2018, 6:44 AM PGY-1, [place] Intern pager: [phone], text pages welcome

## 2018-05-04 LAB — BASIC METABOLIC PANEL
Anion gap: 10 (ref 5–15)
BUN: 79 mg/dL — ABNORMAL HIGH (ref 8–23)
CALCIUM: 8.1 mg/dL — AB (ref 8.9–10.3)
CO2: 17 mmol/L — ABNORMAL LOW (ref 22–32)
Chloride: 102 mmol/L (ref 98–111)
Creatinine, Ser: 3.57 mg/dL — ABNORMAL HIGH (ref 0.44–1.00)
GFR calc Af Amer: 13 mL/min — ABNORMAL LOW (ref >60)
GFR calc non Af Amer: 11 mL/min — ABNORMAL LOW (ref >60)
Glucose, Bld: 124 mg/dL — ABNORMAL HIGH (ref 70–99)
Potassium: 3.3 mmol/L — ABNORMAL LOW (ref 3.5–5.1)
Sodium: 129 mmol/L — ABNORMAL LOW (ref 135–145)

## 2018-05-04 LAB — CBC WITH DIFFERENTIAL/PLATELET
Abs Immature Granulocytes: 0.01 10*3/uL (ref 0.00–0.07)
Basophils Absolute: 0 10*3/uL (ref 0.0–0.1)
Basophils Relative: 0 %
Eosinophils Absolute: 0 10*3/uL (ref 0.0–0.5)
Eosinophils Relative: 1 %
HCT: 26.9 % — ABNORMAL LOW (ref 36.0–46.0)
Hemoglobin: 8.6 g/dL — ABNORMAL LOW (ref 12.0–15.0)
Immature Granulocytes: 0 %
Lymphocytes Relative: 10 %
Lymphs Abs: 0.4 10*3/uL — ABNORMAL LOW (ref 0.7–4.0)
MCH: 27.9 pg (ref 26.0–34.0)
MCHC: 32 g/dL (ref 30.0–36.0)
MCV: 87.3 fL (ref 80.0–100.0)
Monocytes Absolute: 0.2 10*3/uL (ref 0.1–1.0)
Monocytes Relative: 5 %
Neutro Abs: 3.7 10*3/uL (ref 1.7–7.7)
Neutrophils Relative %: 84 %
PLATELETS: 159 10*3/uL (ref 150–400)
RBC: 3.08 MIL/uL — ABNORMAL LOW (ref 3.87–5.11)
RDW: 17.1 % — AB (ref 11.5–15.5)
WBC: 4.4 10*3/uL (ref 4.0–10.5)
nRBC: 0 % (ref 0.0–0.2)

## 2018-05-04 MED ORDER — DEXTROSE 5 % IV SOLN
500.0000 mg | INTRAVENOUS | Status: DC
  Administered 2018-05-04: 500 mg via INTRAVENOUS
  Filled 2018-05-04 (×2): qty 0.5

## 2018-05-04 MED ORDER — POTASSIUM CHLORIDE CRYS ER 20 MEQ PO TBCR
40.0000 meq | EXTENDED_RELEASE_TABLET | Freq: Two times a day (BID) | ORAL | Status: AC
  Administered 2018-05-04 (×2): 40 meq via ORAL
  Filled 2018-05-04 (×2): qty 2

## 2018-05-04 NOTE — Clinical Social Work Note (Signed)
CSW continuing to follow patient's progress and the MD's note (resident with Family Medicine). Per Dr. Jeannine Kitten, patient's discharge is "pending clinical improvement, palliative meeting with family for [place] discussion". CSW will continue to follow and provide [facility] with updated therapy notes to resubmit fort authorization again if needed.  Vicci Reder Givens, MSW, LCSW Licensed Clinical Social Worker [place] [phone]

## 2018-05-04 NOTE — Progress Notes (Signed)
Family Medicine Teaching Service Daily Progress Note Intern Pager: [phone]  Patient name: Jennifer Hinton Medical record number: [id number] Date of birth: 1935/11/28 Age: 83 y.o. Gender: female  Primary Care Provider: [name], MD Consultants: GI Code Status: Full code  Pt Overview and Major Events to Date:  Hospital Day 11 Admitted: 04/23/2018   Assessment and Plan: Jennifer Hinton a 83 y.o.female whopresented with symptomatic anemia 2/2 GI bleed. PMH is significant forHLD, gout, HTN, afib, diastolic CHF, pHTN, CKD IV.   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  - M spike on SPEP, positive MPO ab, low C3,C4.  Pt does not want further workup nor pursue dialysis.  Will manage medically.  Cr improving.   - holding furosemide   - MonitorBMP - avoid nephrotoxic medications - awaiting SNF placement  HAP- occasional fevers of ~101.  Asymptomatic. CXR showed likely RLL pneumonia.   -cefepime (3/15- ). Consider switch to PO doxy today. 7 day total course.  - f/u blood cultures - monitor for fevers.   UTI - urinalysis/urine culture obtained for fever. Large LE/many bacteria.  Started on cefepime.  - continue cefepime - f/u urine culture - monitor fevers.   Hypokalemia - 3.3 this am.   - give 59mq KDur  Symptomatic anemia secondary to GI bleed. - resolved bleed.    Stable around 8-8.5 g/dl. Has received total of 4 U RBC this hospital stay. Based on capsule study results on 04/28/2018, GI believes bleeding due to AVMs. Recommending iron and erythropoietin if blood loss stable. Continue to hold eliquis indefinitely. SCDs on for ppx currently.  - continue to monitor Hgb on daily CBC  - PT/OT eval and treat - recommending SNF and rollating walker, 3-in-1 commode   Hypertension - Normotensive, BP this AM 138/65. Continuing home meds, adjust as needed.   - amlodipine 10 mg - carvedilol 12.5 mg BID -clonidine 0.1 mg BID -hydralazine 50 mg TID -hydralazine 549mQ4  hours PRN for SBP>180 and DBP>110  HFpEF exacerbation 2/2 fluid overload from multiple transfusions. Stable volume status without evidence of overload this morning.  Pt satting well on room air. Putting out [phone] daily w/o lasix.     - cont to hold Lasix in setting of worsening SCr  - Continue amlodipine 70m72m, coreg 25mg55m  - hydralazine 50 mg TID, clonidine 0.1mg B36m- nephrology following, appreciate recommendations   A. fib - Holding Eliquis in the setting of GI bleed. Do not continue on discharge.  - continue carvedilol 25 mg BID   Protein calorie malnutrition -Nutrition consulted -cont Ensure supplements TID   FEN/GI: heart healthy  PPx: SCDs (holding eliquis)   Disposition: pending clinical improvement, palliative meeting with family for [place] di[place]rillssion   Medications: Scheduled Meds: . amLODipine  10 mg Oral Daily  . carvedilol  12.5 mg Oral BID WC  . cloNIDine  0.1 mg Oral Daily  . feeding supplement (ENSURE ENLIVE)  237 mL Oral TID  . hydrALAZINE  50 mg Oral TID  . multivitamin  1 tablet Oral QHS   Continuous Infusions: . ceFEPime (MAXIPIME) IV 1 g (05/03/18 1856)   PRN Meds: acetaminophen **OR** acetaminophen, hydrALAZINE  Subjective:  Patient again states she has no complaints. No CP, no nausea, no cough, no dysuria.     Objective: Temp:  [98.4 F (36.9 C)-100.7 F (38.2 C)] 100.7 F (38.2 C) (03/15 2035) Pulse Rate:  [63-77] 63 (03/15 2035) Resp:  [19] 19 (03/15 2035) BP: (123-144)/(62-65) 138/65 (  03/15 2035) SpO2:  [97 %-100 %] 97 % (03/15 2035) Intake/Output 03/15 0701 - 03/16 0700 In: 400 [P.O.:300; IV Piggyback:100] Out: 850 [Urine:850]   Physical Exam:  Gen: sitting up in bed, leaning on left side.  Patient appears to be less energetic today.    CV: irregularly irregular rhythm.  2+ pulses.   No edema.   Resp: Normal work of breathing on room air. No acute distress.   Abd: soft, nontender. Normal bowel sounds.  Psych: Normal  mood and pleasant affect.    Laboratory: Recent Labs  Lab 05/02/18 0646 05/03/18 0236 05/04/18 0227  WBC 4.2 4.5 4.4  HGB 8.5* 8.1* 8.6*  HCT 25.9* 24.4* 26.9*  PLT 150 158 159   Recent Labs  Lab 04/28/18 0525  05/01/18 0555 05/02/18 0646 05/04/18 0227  NA 132*   < > 131* 132* 129*  K 3.4*   < > 3.7 3.6 3.3*  CL 106   < > 104 103 102  CO2 15*   < > 18* 17* 17*  BUN 74*   < > 81* 83* 79*  CREATININE 3.74*   < > 3.94* 3.85* 3.57*  CALCIUM 7.8*   < > 8.1* 8.3* 8.1*  PROT 6.1*  --   --   --   --   BILITOT 0.7  --   --   --   --   ALKPHOS 27*  --   --   --   --   ALT 7  --   --   --   --   AST 25  --   --   --   --   GLUCOSE 125*   < > 103* 116* 124*   < > = values in this interval not displayed.   Imaging/Diagnostic Tests: Dg Chest 2 View  Result Date: 05/03/2018 CLINICAL DATA:  Fever EXAM: CHEST - 2 VIEW COMPARISON:  05/01/2018 and prior radiographs FINDINGS: Cardiomegaly and bilateral interstitial opacities again noted. Increased RIGHT basilar opacity noted which may represent airspace disease/pneumonia or atelectasis. Bilateral pleural effusions and LEFT basilar opacity/atelectasis again identified. No pneumothorax or acute bony abnormality. IMPRESSION: Increased RIGHT basilar opacity which may represent new airspace disease/pneumonia or atelectasis. Otherwise unchanged appearance of the chest with cardiomegaly, bilateral interstitial opacities/edema, bilateral pleural effusions and LEFT basilar opacity/atelectasis. Electronically Signed   By: Margarette Canada M.D.   On: 05/03/2018 14:33   Benay Pike, MD 05/04/2018, 6:08 AM PGY-1, [place] Intern pager: [phone], text pages welcome

## 2018-05-04 NOTE — Progress Notes (Signed)
PT Cancellation Note  Patient Details Name: Jennifer Hinton MRN: [id number] DOB: [birth year]   Cancelled Treatment:    Reason Eval/Treat Not Completed: Other (comment)   She tells me she is very tired, and that she will get up and move tomorrow;   Roney Marion, [place] Pager [phone] Office [place] 05/04/2018, 4:12 PM

## 2018-05-05 ENCOUNTER — Ambulatory Visit: Payer: Medicare Other | Admitting: Cardiology

## 2018-05-05 LAB — CBC WITH DIFFERENTIAL/PLATELET
Abs Immature Granulocytes: 0.05 10*3/uL (ref 0.00–0.07)
BASOS ABS: 0 10*3/uL (ref 0.0–0.1)
Basophils Relative: 0 %
Eosinophils Absolute: 0.1 10*3/uL (ref 0.0–0.5)
Eosinophils Relative: 1 %
HCT: 28.1 % — ABNORMAL LOW (ref 36.0–46.0)
Hemoglobin: 9.1 g/dL — ABNORMAL LOW (ref 12.0–15.0)
Immature Granulocytes: 1 %
LYMPHS ABS: 0.4 10*3/uL — AB (ref 0.7–4.0)
Lymphocytes Relative: 9 %
MCH: 28.1 pg (ref 26.0–34.0)
MCHC: 32.4 g/dL (ref 30.0–36.0)
MCV: 86.7 fL (ref 80.0–100.0)
Monocytes Absolute: 0.2 10*3/uL (ref 0.1–1.0)
Monocytes Relative: 5 %
NRBC: 0 % (ref 0.0–0.2)
Neutro Abs: 4.2 10*3/uL (ref 1.7–7.7)
Neutrophils Relative %: 84 %
Platelets: 180 10*3/uL (ref 150–400)
RBC: 3.24 MIL/uL — ABNORMAL LOW (ref 3.87–5.11)
RDW: 16.9 % — ABNORMAL HIGH (ref 11.5–15.5)
WBC: 4.9 10*3/uL (ref 4.0–10.5)

## 2018-05-05 LAB — URINE CULTURE

## 2018-05-05 LAB — BASIC METABOLIC PANEL
Anion gap: 9 (ref 5–15)
Anion gap: 9 (ref 5–15)
BUN: 75 mg/dL — ABNORMAL HIGH (ref 8–23)
BUN: 76 mg/dL — ABNORMAL HIGH (ref 8–23)
CALCIUM: 8.3 mg/dL — AB (ref 8.9–10.3)
CO2: 18 mmol/L — ABNORMAL LOW (ref 22–32)
CO2: 16 mmol/L — ABNORMAL LOW (ref 22–32)
Calcium: 8.2 mg/dL — ABNORMAL LOW (ref 8.9–10.3)
Chloride: 102 mmol/L (ref 98–111)
Chloride: 105 mmol/L (ref 98–111)
Creatinine, Ser: 3.4 mg/dL — ABNORMAL HIGH (ref 0.44–1.00)
Creatinine, Ser: 3.3 mg/dL — ABNORMAL HIGH (ref 0.44–1.00)
GFR calc Af Amer: 14 mL/min — ABNORMAL LOW (ref >60)
GFR calc Af Amer: 15 mL/min — ABNORMAL LOW (ref >60)
GFR calc non Af Amer: 12 mL/min — ABNORMAL LOW (ref >60)
GFR calc non Af Amer: 13 mL/min — ABNORMAL LOW (ref >60)
Glucose, Bld: 109 mg/dL — ABNORMAL HIGH (ref 70–99)
Glucose, Bld: 120 mg/dL — ABNORMAL HIGH (ref 70–99)
Potassium: 5.3 mmol/L — ABNORMAL HIGH (ref 3.5–5.1)
Potassium: 4.1 mmol/L (ref 3.5–5.1)
Sodium: 129 mmol/L — ABNORMAL LOW (ref 135–145)
Sodium: 130 mmol/L — ABNORMAL LOW (ref 135–145)

## 2018-05-05 LAB — MRSA PCR SCREENING: MRSA by PCR: NEGATIVE

## 2018-05-05 MED ORDER — NON FORMULARY: 3.0000 mg | Freq: Every day | Status: DC

## 2018-05-05 MED ORDER — AZITHROMYCIN 500 MG PO TABS
500.0000 mg | ORAL_TABLET | Freq: Every day | ORAL | Status: DC
  Administered 2018-05-05 – 2018-05-08 (×4): 500 mg via ORAL
  Filled 2018-05-05 (×4): qty 1

## 2018-05-05 MED ORDER — MELATONIN 3 MG PO TABS
3.0000 mg | ORAL_TABLET | Freq: Every day | ORAL | Status: DC
  Administered 2018-05-06 – 2018-05-07 (×3): 3 mg via ORAL
  Filled 2018-05-05 (×4): qty 1

## 2018-05-05 MED ORDER — SODIUM ZIRCONIUM CYCLOSILICATE 5 G PO PACK
5.0000 g | PACK | Freq: Once | ORAL | Status: AC
  Administered 2018-05-05: 5 g via ORAL
  Filled 2018-05-05: qty 1

## 2018-05-05 MED ORDER — CEFDINIR 300 MG PO CAPS
300.0000 mg | ORAL_CAPSULE | Freq: Every day | ORAL | Status: AC
  Administered 2018-05-05 – 2018-05-08 (×4): 300 mg via ORAL
  Filled 2018-05-05 (×4): qty 1

## 2018-05-05 NOTE — Progress Notes (Signed)
Physical Therapy Treatment Patient Details Name: Jennifer Hinton MRN: [id number] DOB: 03-13-1935 Today's Date: 05/05/2018    History of Present Illness Patient is 83 y/o female admitted to hospital with anemia secondary to chronic GI bleeding. Patient is s/p EGD and capsule endoscopy revealing small bowel AVMs. CT of abdomen revealed abdominal and pelvic ascites. PMH includes HLD, CKD, dCHF, afib, HTN, and gout.     PT Comments    Pt progressing towards physical therapy goals. Was able to perform transfers with RW and up to max assist for balance support and safety. Pt with increased difficulty achieving upright posture and frequently with posterior lean. Pt incontinent of urine during OOB mobility. SNF remains the most appropriate at this time. Will continue to follow and progress as able per POC.    Follow Up Recommendations  SNF;Supervision/Assistance - 24 hour     Equipment Recommendations  Rolling walker with 5" wheels    Recommendations for Other Services       Precautions / Restrictions Precautions Precautions: Fall Restrictions Weight Bearing Restrictions: No    Mobility  Bed Mobility Overal bed mobility: Needs Assistance Bed Mobility: Supine to Sit     Supine to sit: Supervision;HOB elevated     General bed mobility comments: Significant amount of increased time required for pt to transition to EOB. HOB elevated and use of rails required.   Transfers Overall transfer level: Needs assistance Equipment used: Rolling walker (2 wheeled) Transfers: Sit to/from Stand Sit to Stand: Mod assist Stand pivot transfers: Mod assist;Max assist       General transfer comment: Pt required heavy mod assist for power-up to full stand. Pt with posterior lean when attempting to put hands on walker. Up to max assist to take pivotal steps around to [facility], and then recliner.   Ambulation/Gait             General Gait Details: Unable to tolerate this session.    Stairs              Wheelchair Mobility    Modified Rankin (Stroke Patients Only)       Balance Overall balance assessment: Needs assistance Sitting-balance support: No upper extremity supported;Feet supported Sitting balance-Leahy Scale: Good     Standing balance support: Bilateral upper extremity supported Standing balance-Leahy Scale: Poor Standing balance comment: reliant on BUE support to maintain standing balance                            Cognition Arousal/Alertness: Awake/alert Behavior During Therapy: WFL for tasks assessed/performed Overall Cognitive Status: Within Functional Limits for tasks assessed                                        Exercises      General Comments General comments (skin integrity, edema, etc.): Pt's duaghter present during session;educated daughter on importance of mobility every 2 hours to relieve pressure areas;educated pt's daughter on proper skin checks and strategies to maintain skin integrity;educated pt on general UE HEP to be compeleted 3x/day to maintain pt's ROM, strength      Pertinent Vitals/Pain Pain Assessment: No/denies pain    Home Living                      Prior Function            PT Goals (  current goals can now be found in the care plan section) Acute Rehab PT Goals Patient Stated Goal: During session, pt's only goals are to use the bathroom. PT Goal Formulation: With patient Time For Goal Achievement: 05/13/18 Potential to Achieve Goals: Good Progress towards PT goals: Progressing toward goals    Frequency    Min 2X/week      PT Plan Current plan remains appropriate    Co-evaluation              AM-PAC PT "6 Clicks" Mobility   Outcome Measure  Help needed turning from your back to your side while in a flat bed without using bedrails?: A Little Help needed moving from lying on your back to sitting on the side of a flat bed without using bedrails?: A Little Help  needed moving to and from a bed to a chair (including a wheelchair)?: A Lot Help needed standing up from a chair using your arms (e.g., wheelchair or bedside chair)?: A Lot Help needed to walk in hospital room?: Total Help needed climbing 3-5 steps with a railing? : Total 6 Click Score: 12    End of Session Equipment Utilized During Treatment: Gait belt Activity Tolerance: Patient tolerated treatment well Patient left: in chair;with call bell/phone within reach;with chair alarm set Nurse Communication: Mobility status PT Visit Diagnosis: Unsteadiness on feet (R26.81);Other abnormalities of gait and mobility (R26.89);Muscle weakness (generalized) (M62.81);Difficulty in walking, not elsewhere classified (R26.2)     Time: 1330-1407 PT Time Calculation (min) (ACUTE ONLY): 37 min  Charges:  $Gait Training: 23-37 mins                     Rolinda Roan, PT, DPT Acute Rehabilitation Services Pager: [phone] Office: [phone]    Thelma Comp 05/05/2018, 2:16 PM

## 2018-05-05 NOTE — Clinical Social Work Note (Signed)
CSW learned from admissions liaison Juliann Pulse that patient's authorization still good. MD contacted regarding patient's medical status and advised that she is not ready for discharge at this time. [place] liaison contacted and updated. CSW will continue to follow, provide SW intervention services as needed and facilitate discharge to [facility] once medically stable.  Braylei Totino Givens, MSW, LCSW Licensed Clinical Social Worker [place] [phone]

## 2018-05-05 NOTE — Progress Notes (Signed)
Palliative Medicine RN Note: Chart reviewed for decline. GOC are clear per conversation/meeting with PMT NP on 3/15 (pursue IV abx, SNF rehab). We will continue to chart check for decline.  [name] Johnmatthew Solorio, RN, BSN, [facility] Palliative Medicine Team 05/05/2018 12:46 PM Office [phone]

## 2018-05-05 NOTE — Progress Notes (Signed)
Family Medicine Teaching Service Daily Progress Note Intern Pager: [phone]  Patient name: Jennifer Hinton Medical record number: [id number] Date of birth: 1935-12-10 Age: 83 y.o. Gender: female  Primary Care Provider: [name], MD Consultants: GI Code Status: Full code  Pt Overview and Major Events to Date:  Hospital Day 12 Admitted: 04/23/2018   Assessment and Plan: Jennifer Hinton a 83 y.o.female whopresented with symptomatic anemia 2/2 GI bleed. PMH is significant forHLD, gout, HTN, afib, diastolic CHF, pHTN, CKD IV.   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  - M spike on SPEP, positive MPO ab, low C3,C4.  Pt does not want further workup nor pursue dialysis.  Will manage medically.  Cr improving.   - holding furosemide   - MonitorBMP - avoid nephrotoxic medications - awaiting SNF placement  UTI w/ fever- urinalysis/urine culture obtained for fever. >100k e.coli.  Started on cefepime. Still with occasional fevers.   - continue cefepime - f/u urine culture sensitivities.  - monitor fevers.  - MRSA swab - azithro and cefdinir if MRSA negative  Symptomatic anemia secondary to GI bleed. - resolved bleed.    Stable around 8-8.5 g/dl. Has received total of 4 U RBC this hospital stay. Based on capsule study results on 04/28/2018, GI believes bleeding due to AVMs. Recommending iron and erythropoietin if blood loss stable. Continue to hold eliquis indefinitely. SCDs on for ppx currently.   - continue to monitor Hgb on daily CBC  - PT/OT eval and treat - recommending SNF and rollating walker, 3-in-1 commode   Hypertension - Normotensive, BP this AM 148/71 Continuing home meds, adjust as needed.   - amlodipine 10 mg - carvedilol 12.5 mg BID -clonidine 0.1 mg BID -hydralazine 50 mg TID -hydralazine 15m Q4 hours PRN for SBP>180 and DBP>110  HFpEF exacerbation 2/2 fluid overload from multiple transfusions. Stable volume status without evidence of overload this  morning.  Pt satting well on room air. No recorded output on 3/16.       - cont to hold Lasix in setting of worsening SCr  - Continue amlodipine 172mqd, coreg 2529mID  - hydralazine 50 mg TID, clonidine 0.1mg41mD - nephrology following, appreciate recommendations   Hyperkalemia - 5.3 this am.  - BMP at 2pm  A. fib - Holding Eliquis in the setting of GI bleed. Do not continue on discharge.  - continue carvedilol 25 mg BID   Protein calorie malnutrition -Nutrition consulted -cont Ensure supplements TID   FEN/GI: heart healthy  PPx: SCDs (holding eliquis)   Disposition: pending clinical improvement, palliative meeting with family for GOC [place]   Medications: Scheduled Meds: . amLODipine  10 mg Oral Daily  . carvedilol  12.5 mg Oral BID WC  . ceFEPime (MAXIPIME) IV  500 mg Intravenous Q24H  . cloNIDine  0.1 mg Oral Daily  . feeding supplement (ENSURE ENLIVE)  237 mL Oral TID  . hydrALAZINE  50 mg Oral TID  . multivitamin  1 tablet Oral QHS   Continuous Infusions:  PRN Meds: acetaminophen **OR** acetaminophen, hydrALAZINE  Subjective:  Pt states she still feels fine. No complaints.  States she did not decline PT yesterday and will participate today.    Objective: Temp:  [99.4 F (37.4 C)-100.7 F (38.2 C)] 100.5 F (38.1 C) (03/17 0533) Pulse Rate:  [68-85] 85 (03/17 0533) Resp:  [18-22] 18 (03/17 0533) BP: (119-148)/(56-71) 148/71 (03/17 0533) SpO2:  [92 %-97 %] 92 % (03/17 0533) Intake/Output 03/16 0701 - 03/17  0700 In: 220 [P.O.:220] Out: 200 [Urine:200]   Physical Exam:  Gen: sitting up in bed, leaning on left side.  Still not as energetic as she was a few days ago CV: irregularly irregular rhythm.  2+ pulses.   No edema.   Resp: Normal work of breathing on room air. Crackles still present on inspiration, L>R.  No acute distress.   Abd: soft, nontender. Normal bowel sounds.  Psych: Normal mood and pleasant affect.    Laboratory: Recent Labs  Lab  05/03/18 0236 05/04/18 0227 05/05/18 0528  WBC 4.5 4.4 4.9  HGB 8.1* 8.6* 9.1*  HCT 24.4* 26.9* 28.1*  PLT 158 159 180   Recent Labs  Lab 05/01/18 0555 05/02/18 0646 05/04/18 0227  NA 131* 132* 129*  K 3.7 3.6 3.3*  CL 104 103 102  CO2 18* 17* 17*  BUN 81* 83* 79*  CREATININE 3.94* 3.85* 3.57*  CALCIUM 8.1* 8.3* 8.1*  GLUCOSE 103* 116* 124*   Imaging/Diagnostic Tests: Dg Chest 2 View  Result Date: 05/03/2018 CLINICAL DATA:  Fever EXAM: CHEST - 2 VIEW COMPARISON:  05/01/2018 and prior radiographs FINDINGS: Cardiomegaly and bilateral interstitial opacities again noted. Increased RIGHT basilar opacity noted which may represent airspace disease/pneumonia or atelectasis. Bilateral pleural effusions and LEFT basilar opacity/atelectasis again identified. No pneumothorax or acute bony abnormality. IMPRESSION: Increased RIGHT basilar opacity which may represent new airspace disease/pneumonia or atelectasis. Otherwise unchanged appearance of the chest with cardiomegaly, bilateral interstitial opacities/edema, bilateral pleural effusions and LEFT basilar opacity/atelectasis. Electronically Signed   By: Margarette Canada M.D.   On: 05/03/2018 14:33   Benay Pike, MD 05/05/2018, 6:39 AM PGY-1, [place] Intern pager: [phone], text pages welcome

## 2018-05-05 NOTE — Progress Notes (Signed)
Occupational Therapy Treatment Patient Details Name: Jennifer Hinton MRN: [id number] DOB: 08/23/1935 Today's Date: 05/05/2018    History of present illness Patient is 83 y/o female admitted to hospital with anemia secondary to chronic GI bleeding. Patient is s/p EGD and capsule endoscopy revealing small bowel AVMs. CT of abdomen revealed abdominal and pelvic ascites. PMH includes HLD, CKD, dCHF, afib, HTN, and gout.    OT comments  Upon arrival, pt eating lunch after setupA from her daughter, pt appeared to have a good appetite, eating a sandwich provided by her daughter in addition to a tuna sandwich from food services. Session this date focused on pt/family education regarding HEP to maintain strength and ROM, positional changes and performing skin checks to maintain skin integrity. Discussed with pt's daughter importance of encouraging pt to participate during ADL to maximize her independence. Pt's daughter verbalized understanding and had no additional questions this date. Pt will continue to benefit from skilled OT services to maximize safety and independence with ADL and functional mobility. Will continue to follow acutely.   Follow Up Recommendations  SNF;Supervision/Assistance - 24 hour    Equipment Recommendations  3 in 1 bedside commode    Recommendations for Other Services      Precautions / Restrictions Precautions Precautions: Fall       Mobility Bed Mobility                  Transfers                      Balance                                           ADL either performed or assessed with clinical judgement   ADL Overall ADL's : Needs assistance/impaired Eating/Feeding: Set up;Sitting Eating/Feeding Details (indicate cue type and reason): pt eating sitting bed level                                   General ADL Comments: educated pt's duaghter on importance of encouraging pt to do be as independent with ADL as  possible and participate as tolerated     Vision       Perception     Praxis      Cognition Arousal/Alertness: Awake/alert Behavior During Therapy: WFL for tasks assessed/performed Overall Cognitive Status: Within Functional Limits for tasks assessed                                          Exercises     Shoulder Instructions       General Comments Pt's duaghter present during session;educated daughter on importance of mobility every 2 hours to relieve pressure areas;educated pt's daughter on proper skin checks and strategies to maintain skin integrity;educated pt on general UE HEP to be compeleted 3x/day to maintain pt's ROM, strength    Pertinent Vitals/ Pain       Pain Assessment: No/denies pain  Home Living                                          Prior  Functioning/Environment              Frequency  Min 2X/week        Progress Toward Goals  OT Goals(current goals can now be found in the care plan section)  Progress towards OT goals: Progressing toward goals  Acute Rehab OT Goals Patient Stated Goal: to get stronger OT Goal Formulation: With patient Time For Goal Achievement: 05/14/18 Potential to Achieve Goals: Good ADL Goals Pt Will Perform Grooming: with supervision Pt Will Perform Upper Body Bathing: with supervision Pt Will Perform Lower Body Dressing: with modified independence Pt Will Transfer to Toilet: with supervision;ambulating  Plan Discharge plan remains appropriate    Co-evaluation                 AM-PAC OT "6 Clicks" Daily Activity     Outcome Measure   Help from another person eating meals?: None Help from another person taking care of personal grooming?: A Little Help from another person toileting, which includes using toliet, bedpan, or urinal?: A Little Help from another person bathing (including washing, rinsing, drying)?: A Little Help from another person to put on and taking off  regular upper body clothing?: A Little Help from another person to put on and taking off regular lower body clothing?: A Little 6 Click Score: 19    End of Session    OT Visit Diagnosis: Unsteadiness on feet (R26.81);Other abnormalities of gait and mobility (R26.89);Muscle weakness (generalized) (M62.81)   Activity Tolerance Patient tolerated treatment well   Patient Left in bed;with call bell/phone within reach;with family/visitor present   Nurse Communication Mobility status        Time: 1202-1212 OT Time Calculation (min): 10 min  Charges: OT General Charges $OT Visit: 1 Visit OT Treatments $Self Care/Home Management : 8-22 mins  Dorinda Hill OTR/L Acute Rehabilitation Services Office: [place] 05/05/2018, 2:00 PM

## 2018-05-06 ENCOUNTER — Inpatient Hospital Stay (HOSPITAL_COMMUNITY): Payer: Medicare Other

## 2018-05-06 LAB — CBC WITH DIFFERENTIAL/PLATELET
Abs Immature Granulocytes: 0.03 10*3/uL (ref 0.00–0.07)
Basophils Absolute: 0 10*3/uL (ref 0.0–0.1)
Basophils Relative: 0 %
Eosinophils Absolute: 0 10*3/uL (ref 0.0–0.5)
Eosinophils Relative: 1 %
HCT: 26.8 % — ABNORMAL LOW (ref 36.0–46.0)
Hemoglobin: 8.5 g/dL — ABNORMAL LOW (ref 12.0–15.0)
Immature Granulocytes: 1 %
Lymphocytes Relative: 11 %
Lymphs Abs: 0.5 10*3/uL — ABNORMAL LOW (ref 0.7–4.0)
MCH: 27.6 pg (ref 26.0–34.0)
MCHC: 31.7 g/dL (ref 30.0–36.0)
MCV: 87 fL (ref 80.0–100.0)
Monocytes Absolute: 0.2 10*3/uL (ref 0.1–1.0)
Monocytes Relative: 5 %
NRBC: 0 % (ref 0.0–0.2)
Neutro Abs: 3.4 10*3/uL (ref 1.7–7.7)
Neutrophils Relative %: 82 %
Platelets: 142 10*3/uL — ABNORMAL LOW (ref 150–400)
RBC: 3.08 MIL/uL — AB (ref 3.87–5.11)
RDW: 16.9 % — ABNORMAL HIGH (ref 11.5–15.5)
WBC: 4.2 10*3/uL (ref 4.0–10.5)

## 2018-05-06 LAB — BASIC METABOLIC PANEL
Anion gap: 7 (ref 5–15)
BUN: 72 mg/dL — ABNORMAL HIGH (ref 8–23)
CO2: 16 mmol/L — ABNORMAL LOW (ref 22–32)
Calcium: 8 mg/dL — ABNORMAL LOW (ref 8.9–10.3)
Chloride: 108 mmol/L (ref 98–111)
Creatinine, Ser: 3.21 mg/dL — ABNORMAL HIGH (ref 0.44–1.00)
GFR calc Af Amer: 15 mL/min — ABNORMAL LOW (ref >60)
GFR calc non Af Amer: 13 mL/min — ABNORMAL LOW (ref >60)
Glucose, Bld: 98 mg/dL (ref 70–99)
Potassium: 3.8 mmol/L (ref 3.5–5.1)
Sodium: 131 mmol/L — ABNORMAL LOW (ref 135–145)

## 2018-05-06 NOTE — Clinical Social Work Note (Signed)
CSW talked with 2 of patient's granddaughters (Jennifer Hinton and Jennifer Hinton) who came to visit patient regarding her discharge disposition. They were tearful and expressed co confusion regarding their grandmother's discharge plan. CSW expressed empathic understanding of their emotions and concerns. CSW talked with doctor by phone and was informed that someone would come to the unit to speak with them in about an hour. Granddaughters advised and agreed to wait. The granddaughter's later decided to leave and asked CSW to contact their aunt Jennifer Hinton [phone]). CSW did speak with the doctor regarding the family's possible change in patient's discharge plan to residential hospice. Per MD, the patient would need to be involved as she is oriented.  Call made to daughter Jennifer Hinton and discussion had regarding her mom's current discharge plan and possible change of plan to home with hospice or a residential hospice facility. Ms. Jennifer Hinton expressed concern regarding her mother being physically able to fully participate in rehab and had spoken with her sibling and they are in agreement with hospice becoming involved, however she expressed discomfort regarding talking with her mom about going to a hospice facility. Jennifer Hinton with Palliative Care contacted regarding probable change in discharge plan and daughter's discomfort in talking with her mom about going to a hospice facility. Per Jennifer Hinton, someone will contact Jennifer Hinton tomorrow and discuss hospice placement. Call made to [facility] with [facility] and update provided. CSW will continue to follow, provide SW intervention services as needed and facilitate discharge to an appropriate venue once decision made.  Jennifer Hinton, MSW, LCSW Licensed Clinical Social Worker [place] [phone]

## 2018-05-06 NOTE — Progress Notes (Signed)
Family Medicine Teaching Service Daily Progress Note Intern Pager: [phone]  Patient name: Jennifer Hinton Medical record number: [id number] Date of birth: May 25, 1935 Age: 83 y.o. Gender: female  Primary Care Provider: [name], MD Consultants: GI Code Status: Full code  Pt Overview and Major Events to Date:  Hospital Day 13 Admitted: 04/23/2018   Assessment and Plan: Jennifer Hinton a 83 y.o.female whopresented with symptomatic anemia 2/2 GI bleed. PMH is significant forHLD, gout, HTN, afib, diastolic CHF, pHTN, CKD IV.   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  - M spike on SPEP, positive MPO ab, low C3,C4.  Pt does not want further workup nor pursue dialysis.  Will manage medically.  Cr improving.   - holding furosemide   - MonitorBMP - avoid nephrotoxic medications - awaiting SNF placement  UTI w/ fever- patient is still consistently spiking fevers of 100-101 in the evening/night despite being asymptomatic. urinalysis/urine culture obtained for fever. >100k e.coli.  Started on cefepime, switched to azithro/cefdinir. MRSA swab neg. In past few days has only received tylenol once last night and once early yesterday morning. At this point, it is likely the fever is unrelated to the UTI/bacterial infection, and possibly a mild viral infection that is causing her temp to rise in the evening.   - d/c cefepime.  - start azithro/cefdinir - monitor fevers.  - CXR  Symptomatic anemia secondary to GI bleed. - resolved bleed.    Stable around 8-8.5 g/dl. Has received total of 4 U RBC this hospital stay. Based on capsule study results on 04/28/2018, GI believes bleeding due to AVMs. Recommending iron and erythropoietin if blood loss stable. Continue to hold eliquis indefinitely. SCDs on for ppx currently.   - continue to monitor Hgb on daily CBC  - PT/OT eval and treat - recommending SNF and rollating walker, 3-in-1 commode   Hypertension - Normotensive, BP this AM  148/71 Continuing home meds, adjust as needed.   - amlodipine 10 mg - carvedilol 12.5 mg BID -clonidine 0.1 mg BID -hydralazine 50 mg TID -hydralazine 41m Q4 hours PRN for SBP>180 and DBP>110  HFpEF exacerbation 2/2 fluid overload from multiple transfusions. Stable volume status without evidence of overload this morning.  Pt satting well on room air. No recorded output on 3/16.       - cont to hold Lasix in setting of worsening SCr  - Continue amlodipine 170mqd, coreg 2549mID  - hydralazine 50 mg TID, clonidine 0.1mg2mD - nephrology following, appreciate recommendations   A. fib - Holding Eliquis in the setting of GI bleed. Do not continue on discharge.  - continue carvedilol 25 mg BID   Protein calorie malnutrition -Nutrition consulted -cont Ensure supplements TID   FEN/GI: heart healthy  PPx: SCDs (holding eliquis)   Disposition: pending clinical improvement, palliative meeting with family for [place]   Medications: Scheduled Meds: . amLODipine  10 mg Oral Daily  . azithromycin  500 mg Oral Q supper  . carvedilol  12.5 mg Oral BID WC  . cefdinir  300 mg Oral q1800  . cloNIDine  0.1 mg Oral Daily  . feeding supplement (ENSURE ENLIVE)  237 mL Oral TID  . hydrALAZINE  50 mg Oral TID  . Melatonin  3 mg Oral QHS  . multivitamin  1 tablet Oral QHS   Continuous Infusions:  PRN Meds: acetaminophen **OR** acetaminophen, hydrALAZINE  Subjective:  Not having headache, chest pain, SOB, abdominal pain.  States she was tired last night.  Objective: Temp:  [98.8 F (37.1 C)-100.6 F (38.1 C)] 99.6 F (37.6 C) (03/18 0551) Pulse Rate:  [79-88] 88 (03/18 0551) Resp:  [18-19] 18 (03/18 0551) BP: (136-157)/(71-85) 157/82 (03/18 0551) SpO2:  [95 %-97 %] 97 % (03/18 0551) Intake/Output 03/17 0701 - 03/18 0700 In: 480 [P.O.:480] Out: 1 [Stool:1]   Physical Exam:  Gen: slaying in bed, eyes closed. Decreased energy.  HEENT: left eye was closed due to mucus.  No  conjunctivitis seen, no scleral injection.  CV: irregularly irregular rhythm.  2+ pulses.   No edema.   Resp: Normal work of breathing on room air. Crackles still present on inspiration, L>R.  No acute distress.   Abd: soft, nontender. Normal bowel sounds.  Psych: Normal mood and pleasant affect.    Laboratory: Recent Labs  Lab 05/04/18 0227 05/05/18 0528 05/06/18 0538  WBC 4.4 4.9 4.2  HGB 8.6* 9.1* 8.5*  HCT 26.9* 28.1* 26.8*  PLT 159 180 142*   Recent Labs  Lab 05/04/18 0227 05/05/18 0528 05/05/18 1458  NA 129* 129* 130*  K 3.3* 5.3* 4.1  CL 102 102 105  CO2 17* 18* 16*  BUN 79* 75* 76*  CREATININE 3.57* 3.40* 3.30*  CALCIUM 8.1* 8.3* 8.2*  GLUCOSE 124* 109* 120*   Imaging/Diagnostic Tests: No results found. Benay Pike, MD 05/06/2018, 6:17 AM PGY-1, [place] Intern pager: [phone], text pages welcome

## 2018-05-06 NOTE — Care Management Important Message (Signed)
Important Message  Patient Details  Name: Jennifer Hinton MRN: [id number] Date of Birth: 1935/06/25   Medicare Important Message Given:  Yes    [name] Montine Circle 05/06/2018, 11:33 AM

## 2018-05-07 DIAGNOSIS — N189 Chronic kidney disease, unspecified: Secondary | ICD-10-CM

## 2018-05-07 DIAGNOSIS — E43 Unspecified severe protein-calorie malnutrition: Secondary | ICD-10-CM

## 2018-05-07 DIAGNOSIS — C9000 Multiple myeloma not having achieved remission: Secondary | ICD-10-CM

## 2018-05-07 DIAGNOSIS — N179 Acute kidney failure, unspecified: Secondary | ICD-10-CM

## 2018-05-07 LAB — CBC WITH DIFFERENTIAL/PLATELET
Abs Immature Granulocytes: 0.07 10*3/uL (ref 0.00–0.07)
Basophils Absolute: 0 10*3/uL (ref 0.0–0.1)
Basophils Relative: 0 %
Eosinophils Absolute: 0 10*3/uL (ref 0.0–0.5)
Eosinophils Relative: 1 %
HCT: 28.6 % — ABNORMAL LOW (ref 36.0–46.0)
Hemoglobin: 9.1 g/dL — ABNORMAL LOW (ref 12.0–15.0)
Immature Granulocytes: 1 %
Lymphocytes Relative: 7 %
Lymphs Abs: 0.4 10*3/uL — ABNORMAL LOW (ref 0.7–4.0)
MCH: 27.7 pg (ref 26.0–34.0)
MCHC: 31.8 g/dL (ref 30.0–36.0)
MCV: 86.9 fL (ref 80.0–100.0)
Monocytes Absolute: 0.2 10*3/uL (ref 0.1–1.0)
Monocytes Relative: 3 %
Neutro Abs: 4.4 10*3/uL (ref 1.7–7.7)
Neutrophils Relative %: 88 %
Platelets: 150 10*3/uL (ref 150–400)
RBC: 3.29 MIL/uL — AB (ref 3.87–5.11)
RDW: 16.8 % — ABNORMAL HIGH (ref 11.5–15.5)
WBC: 5 10*3/uL (ref 4.0–10.5)
nRBC: 0 % (ref 0.0–0.2)

## 2018-05-07 LAB — BASIC METABOLIC PANEL
Anion gap: 11 (ref 5–15)
BUN: 68 mg/dL — ABNORMAL HIGH (ref 8–23)
CO2: 16 mmol/L — ABNORMAL LOW (ref 22–32)
Calcium: 8.4 mg/dL — ABNORMAL LOW (ref 8.9–10.3)
Chloride: 104 mmol/L (ref 98–111)
Creatinine, Ser: 2.97 mg/dL — ABNORMAL HIGH (ref 0.44–1.00)
GFR calc Af Amer: 17 mL/min — ABNORMAL LOW (ref >60)
GFR calc non Af Amer: 14 mL/min — ABNORMAL LOW (ref >60)
Glucose, Bld: 101 mg/dL — ABNORMAL HIGH (ref 70–99)
Potassium: 4.5 mmol/L (ref 3.5–5.1)
SODIUM: 131 mmol/L — AB (ref 135–145)

## 2018-05-07 MED ORDER — CEFDINIR 300 MG PO CAPS: 300.0000 mg | ORAL_CAPSULE | Freq: Every day | ORAL | Status: AC

## 2018-05-07 MED ORDER — AZITHROMYCIN 500 MG PO TABS: 500.0000 mg | ORAL_TABLET | Freq: Every day | ORAL | 0 refills | Status: AC

## 2018-05-07 MED ORDER — HYDRALAZINE HCL 50 MG PO TABS: 50.0000 mg | ORAL_TABLET | Freq: Three times a day (TID) | ORAL | Status: AC

## 2018-05-07 MED ORDER — CARVEDILOL 12.5 MG PO TABS: 12.5000 mg | ORAL_TABLET | Freq: Two times a day (BID) | ORAL | Status: AC

## 2018-05-07 MED ORDER — BOOST / RESOURCE BREEZE PO LIQD CUSTOM
1.0000 | Freq: Three times a day (TID) | ORAL | Status: DC
  Administered 2018-05-07 – 2018-05-08 (×2): 1 via ORAL
  Filled 2018-05-07 (×6): qty 1

## 2018-05-07 MED ORDER — RENA-VITE PO TABS: 1.0000 | ORAL_TABLET | Freq: Every day | ORAL | 0 refills | Status: AC

## 2018-05-07 NOTE — Progress Notes (Signed)
Family Medicine Teaching Service Daily Progress Note Intern Pager: [phone]  Patient name: Jennifer Hinton Medical record number: [id number] Date of birth: 05/07/1935 Age: 83 y.o. Gender: female  Primary Care Provider: [name], MD Consultants: GI Code Status: Full code  Pt Overview and Major Events to Date:  Hospital Day 14 Admitted: 04/23/2018   Assessment and Plan: Jennifer Hinton a 83 y.o.female whopresented with symptomatic anemia 2/2 GI bleed. PMH is significant forHLD, gout, HTN, afib, diastolic CHF, pHTN, CKD IV.   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  - M spike on SPEP, positive MPO ab, low C3,C4.  Pt does not want further workup nor pursue dialysis.  Will manage medically.  Cr improving.   - holding furosemide   - MonitorBMP - avoid nephrotoxic medications - awaiting SNF placement  UTI w/ fever-Afebrile for last 24 hrs.  Was likely viral, but could also have been bacterial that was not adequately treated until azithro/cefdinir started.  urinalysis/urine culture obtained for fever. >100k e.coli.  Started on cefepime, switched to azithro/cefdinir. MRSA swab neg. In past few days has only received tylenol once last night and once early yesterday morning.   - start azithro/cefdinir - monitor fevers.   Symptomatic anemia secondary to GI bleed. - resolved bleed.    Stable around 8-8.5 g/dl. Has received total of 4 U RBC this hospital stay. Based on capsule study results on 04/28/2018, GI believes bleeding due to AVMs. Recommending iron and erythropoietin if blood loss stable. Continue to hold eliquis indefinitely. SCDs on for ppx currently.   - continue to monitor Hgb on daily CBC  - PT/OT eval and treat - recommending SNF and rollating walker, 3-in-1 commode   Hypertension - Normotensive, BP this AM 145/79 Continuing home meds, adjust as needed.   - amlodipine 10 mg - carvedilol 12.5 mg BID -d/c clonidine -hydralazine 50 mg TID -hydralazine 62m Q4  hours PRN for SBP>180 and DBP>110  HFpEF exacerbation 2/2 fluid overload from multiple transfusions. Stable volume status without evidence of overload this morning.  Pt satting well on room air. No recorded output on 3/16.       - cont to hold Lasix in setting of worsening SCr  - Continue amlodipine 115mqd, coreg 2579mID  - hydralazine 50 mg TID, clonidine 0.1mg65mD - nephrology following, appreciate recommendations   A. fib - Holding Eliquis in the setting of GI bleed. Do not continue on discharge.  - continue carvedilol 25 mg BID   Protein calorie malnutrition -Nutrition consulted -cont Ensure supplements TID   FEN/GI: heart healthy  PPx: SCDs (holding eliquis)   Disposition: pending clinical improvement, palliative meeting with family for GOC [place]   Medications: Scheduled Meds: . amLODipine  10 mg Oral Daily  . azithromycin  500 mg Oral Q supper  . carvedilol  12.5 mg Oral BID WC  . cefdinir  300 mg Oral q1800  . cloNIDine  0.1 mg Oral Daily  . feeding supplement (ENSURE ENLIVE)  237 mL Oral TID  . hydrALAZINE  50 mg Oral TID  . Melatonin  3 mg Oral QHS  . multivitamin  1 tablet Oral QHS   Continuous Infusions:  PRN Meds: acetaminophen **OR** acetaminophen, hydrALAZINE  Subjective:  Patient again has no complaints.  States she is not hungry.  She has a lump on her arm that has been there 'for years'.   Objective: Temp:  [98.1 F (36.7 C)-99.6 F (37.6 C)] 98.7 F (37.1 C) (03/19 0535) Pulse  Rate:  [72-85] 85 (03/19 0535) Resp:  [18] 18 (03/19 0535) BP: (118-145)/(65-79) 145/79 (03/19 0535) SpO2:  [95 %-98 %] 96 % (03/19 0535) Weight:  [65.3 kg] 65.3 kg (03/18 2141) Intake/Output 03/18 0701 - 03/19 0700 In: 620 [P.O.:620] Out: 500 [Urine:500]   Physical Exam:  Gen: sitting up in bed.   CV: irregularly irregular rhythm.  2+ pulses.   No edema.   Resp: Normal work of breathing on room air. Crackles still present on inspiration, L>R.  No acute  distress.   Abd: soft, nontender. Normal bowel sounds.  Psych: Normal mood and pleasant affect.    Laboratory: Recent Labs  Lab 05/04/18 0227 05/05/18 0528 05/06/18 0538  WBC 4.4 4.9 4.2  HGB 8.6* 9.1* 8.5*  HCT 26.9* 28.1* 26.8*  PLT 159 180 142*   Recent Labs  Lab 05/05/18 0528 05/05/18 1458 05/06/18 0538  NA 129* 130* 131*  K 5.3* 4.1 3.8  CL 102 105 108  CO2 18* 16* 16*  BUN 75* 76* 72*  CREATININE 3.40* 3.30* 3.21*  CALCIUM 8.3* 8.2* 8.0*  GLUCOSE 109* 120* 98   Imaging/Diagnostic Tests: Dg Chest 2 View  Result Date: 05/06/2018 CLINICAL DATA:  Fever EXAM: CHEST - 2 VIEW COMPARISON:  05/03/2018, 04/23/2018 FINDINGS: Marked cardiomegaly with central vascular congestion and mild interstitial edema. Small pleural effusions with airspace disease at the left base. Aortic atherosclerosis. No pneumothorax. IMPRESSION: 1. Marked cardiomegaly with vascular congestion and pulmonary edema. 2. Small pleural effusions. Airspace disease at the left base which may reflect atelectasis, pneumonia or aspiration. Electronically Signed   By: Donavan Foil M.D.   On: 05/06/2018 15:14   Benay Pike, MD 05/07/2018, 6:44 AM PGY-1, [place] Intern pager: [phone], text pages welcome

## 2018-05-07 NOTE — Progress Notes (Signed)
Nutrition Follow-up  DOCUMENTATION CODES:   Severe malnutrition in context of chronic illness  INTERVENTION:   -D/c Ensure Enlive po TID, each supplement provides 350 kcal and 20 grams of protein -Boost Breeze po TID, each supplement provides 250 kcal and 9 grams of protein -Continue renal MVI -Magic Cup TID with meals, each supplement provides 290 kcals and 9 grams protein  NUTRITION DIAGNOSIS:   Severe Malnutrition related to chronic illness as evidenced by energy intake < 75% for > or equal to 3 months, moderate fat depletion, severe muscle depletion.  Ongoing  GOAL:   Patient will meet greater than or equal to 90% of their needs  Unmet  MONITOR:   PO intake, Supplement acceptance, Diet advancement, Labs, Weight trends  REASON FOR ASSESSMENT:   Consult Assessment of nutrition requirement/status  ASSESSMENT:   83 yo female, admitted for surgery to treat chronic GI bleeding. PMH significant for acute blood loss anemia, A-fib, chronic diastolic CHF, HLD, HTN, AKI on CKD stage 4.  Reviewed I/O's: +120 ml x 24 hours and +2.5 L since admission  Case discussed with RN prior to visit, who reports pt is alert and doing well today. Per RN, pt is tired of Ensure supplements and offered Boost Breeze this morning, which she likes. RN shares plan is likely to d/c to [facility] today.   Spoke with pt at bedside, who reports feeling well today. Observed breakfast tray, pt consumed 50% of a banana. Meal completion has declined; PO 0-25%. Pt reports she likes the [employer], however, "I'm sick and tired of that Ensure; as soon as I finish one, they give me another one". Pt also admits to snacking on snickers candy brought in by her daughters.   Reviewed palliative care notes; plan for DNR/DNI, no HD, and continue to treat the treatable (IV antibiotics but no further work-up for renal disease or multiple myeloma). Noted that pt continues to decline. Pt will initially  participate in SNF for rehab, with likelihood to transition to hopsice services.   Labs reviewed: Na: 131.   Diet Order:   Diet Order            Diet Heart Room service appropriate? Yes; Fluid consistency: Thin  Diet effective now              EDUCATION NEEDS:   Education needs have been addressed  Skin:  Skin Assessment: Reviewed RN Assessment  Last BM:  05/06/18  Height:   Ht Readings from Last 1 Encounters:  04/27/18 5' 4"  (1.626 m)    Weight:   Wt Readings from Last 1 Encounters:  05/06/18 65.3 kg    Ideal Body Weight:  54.5 kg  BMI:  Body mass index is 24.71 kg/m.  Estimated Nutritional Needs:   Kcal:  1425-1710 (25-30 kcal/kg)  Protein:  68-86 gm (1.2-1.5 g/kg)  Fluid:  </= 1.5 L daily or per MD    Soffia Doshier A. Jimmye Norman, RD, LDN, [place] Registered Dietitian II Certified Diabetes Care and Education Specialist Pager: [phone] After hours Pager: [phone]

## 2018-05-07 NOTE — Clinical Social Work Note (Signed)
Patient was determined medically stable for discharge earlier today and going to [facility]. Daughter Calla Kicks [phone]) contacted and advised. Discharge summary completed and transmitted to [facility] and nurse given information to call report. CSW informed after 5 pm that patient would not discharge today as her temperature is 101.3. Daughter Olivia Mackie [phone]) contacted and advised. Per nurse, patient must be fever free for 24 hours. CSW will continue to follow, provide SW intervention services as needed and facilitate discharge to [facility] once medically stable.  Detrich Rakestraw Givens, MSW, LCSW Licensed Clinical Social Worker [place] [phone]

## 2018-05-07 NOTE — Progress Notes (Signed)
Patient spiked temp of 101.3 Rectally, spoke with MD who feels she is still stable discharge patient if facility will accept her  Spoke with Teacher, music, Therapist, sports at [employer]. Facility will not accept patient at this time.

## 2018-05-07 NOTE — Progress Notes (Addendum)
Followed up with patient and great grand son at bedside.  Patient is awake and alert but fatigued.  She answers questions appropriately but then refers me to her daughter Olivia Mackie.  Lunch tray, ensure, resource breeze are all at bedside untouched.  Patient refuses to eat or drink at the moment.  I understand from her great grand son that she is being discharged to SNF today.  Hgb 9.1.  Not on anticoagulation.  CV irreg irreg.  Resp no distress.  Abdomen soft, nt, nd.  Concerned for high likelihood of re-admission.  Palliative to follow outpatient at [facility].  Florentina Jenny, PA-C Palliative Medicine Pager: [phone]  15 min.

## 2018-05-08 DIAGNOSIS — R5381 Other malaise: Secondary | ICD-10-CM | POA: Diagnosis not present

## 2018-05-08 DIAGNOSIS — I10 Essential (primary) hypertension: Secondary | ICD-10-CM | POA: Diagnosis not present

## 2018-05-08 DIAGNOSIS — N390 Urinary tract infection, site not specified: Secondary | ICD-10-CM | POA: Diagnosis not present

## 2018-05-08 DIAGNOSIS — D500 Iron deficiency anemia secondary to blood loss (chronic): Secondary | ICD-10-CM | POA: Diagnosis not present

## 2018-05-08 DIAGNOSIS — D509 Iron deficiency anemia, unspecified: Secondary | ICD-10-CM | POA: Diagnosis not present

## 2018-05-08 DIAGNOSIS — R6881 Early satiety: Secondary | ICD-10-CM | POA: Diagnosis not present

## 2018-05-08 DIAGNOSIS — I4891 Unspecified atrial fibrillation: Secondary | ICD-10-CM | POA: Diagnosis not present

## 2018-05-08 DIAGNOSIS — R58 Hemorrhage, not elsewhere classified: Secondary | ICD-10-CM | POA: Diagnosis not present

## 2018-05-08 DIAGNOSIS — D649 Anemia, unspecified: Secondary | ICD-10-CM | POA: Diagnosis not present

## 2018-05-08 DIAGNOSIS — M2550 Pain in unspecified joint: Secondary | ICD-10-CM | POA: Diagnosis not present

## 2018-05-08 DIAGNOSIS — C9000 Multiple myeloma not having achieved remission: Secondary | ICD-10-CM | POA: Diagnosis not present

## 2018-05-08 DIAGNOSIS — I5032 Chronic diastolic (congestive) heart failure: Secondary | ICD-10-CM | POA: Diagnosis not present

## 2018-05-08 DIAGNOSIS — E43 Unspecified severe protein-calorie malnutrition: Secondary | ICD-10-CM | POA: Diagnosis not present

## 2018-05-08 DIAGNOSIS — R0902 Hypoxemia: Secondary | ICD-10-CM | POA: Diagnosis not present

## 2018-05-08 DIAGNOSIS — I480 Paroxysmal atrial fibrillation: Secondary | ICD-10-CM | POA: Diagnosis not present

## 2018-05-08 DIAGNOSIS — K922 Gastrointestinal hemorrhage, unspecified: Secondary | ICD-10-CM | POA: Diagnosis not present

## 2018-05-08 DIAGNOSIS — Z7901 Long term (current) use of anticoagulants: Secondary | ICD-10-CM | POA: Diagnosis not present

## 2018-05-08 DIAGNOSIS — M6281 Muscle weakness (generalized): Secondary | ICD-10-CM | POA: Diagnosis not present

## 2018-05-08 DIAGNOSIS — N184 Chronic kidney disease, stage 4 (severe): Secondary | ICD-10-CM | POA: Diagnosis not present

## 2018-05-08 DIAGNOSIS — R531 Weakness: Secondary | ICD-10-CM | POA: Diagnosis not present

## 2018-05-08 DIAGNOSIS — Z7401 Bed confinement status: Secondary | ICD-10-CM | POA: Diagnosis not present

## 2018-05-08 DIAGNOSIS — Z515 Encounter for palliative care: Secondary | ICD-10-CM | POA: Diagnosis not present

## 2018-05-08 LAB — CULTURE, BLOOD (ROUTINE X 2)
Culture: NO GROWTH
Culture: NO GROWTH
Special Requests: ADEQUATE
Special Requests: ADEQUATE

## 2018-05-08 NOTE — Consult Note (Signed)
   [facility] CM Inpatient Consult   05/08/2018  Jeanelle Dake [birth year] [id number]   Patient screened for potential [facility] Care Management services with extreme- 32% unplanned readmission and hospitalization.  Chart reviewed and shows as follows: Patient presented with symptomatic anemia secondary to GI bleed. (end stage renal disease likely secondary to multiple myeloma or vasculitis, fever, UTI)  Ms. Ardice Boyan an 83 y.o. female  is significant forHLD, gout, HTN, afib, diastolic CHF, pHTN, CKD IV.  Patient' s primary care provider is Dr. [name] with [facility].  Per MD note, current discharge disposition is pending clinical improvement, palliative meeting with family for GOC-goals of care discussion and following the palliative care recommendations regarding hospice house vs. home hospice.   No current [facility] Care Management needs for community follow-up identified at this point. Please place a [facility] Care Management consult if patient's post hospital needs change.    For questions please contact:  Edwena Felty A. Nattaly Yebra, BSN, RN-BC [facility] Liaison Cell: [phone]

## 2018-05-08 NOTE — Progress Notes (Signed)
Spoke with daughters at bedside. Discussed home with hospice and provided choice list. However, confirmed with SW that patient is going to [facility] this afternoon, and that [facility] is aware that patient will have fevers. Discussed with daughters to keep choice list for future needs adding that hospice can follow patient in the SNF. Bedside RN aware of patient transition to [facility] today. SW arranging transport via [facility]. No other transition of care needs identified at this time.   Bartholomew Crews, RN MSN CCM Transitions of Care 80M CM [phone]

## 2018-05-08 NOTE — Discharge Summary (Signed)
[facility] Discharge Summary  Patient name: Jennifer Hinton Medical record number: [id number] Date of birth: 18-Apr-1935 Age: 83 y.o. Gender: female Date of Admission: 04/23/2018  Date of Discharge: 05/07/2018 Admitting Physician: Zenia Resides, MD  Primary Care Provider: [name], MD Consultants: GI, nephro, palliative.  Indication for Hospitalization: symptomatic anemia  Discharge Diagnoses/Problem List:  GI bleed Anemia End-stage renal disease UTI HTN HFpEF Afib  Disposition: SNF  Discharge Condition: improved, stable  Discharge Exam:  BP (!) 142/67 (BP Location: Right Arm)   Pulse 95   Temp 99.9 F (37.7 C) (Oral)   Resp 20   Ht 5' 4"  (1.626 m)   Wt 65.1 kg   SpO2 94%   BMI 24.64 kg/m   Gen: sitting up in bed.  Leaning to her left side.  Eyes closed.   CV: irregularly irregular rhythm.  2+ pulses.   No edema.   Resp: Normal work of breathing on room air. Crackles on  inspiration, L>R.  No acute distress.   Abd: soft, nontender. Normal bowel sounds.  Psych: Normal mood and pleasant affect.    Brief Hospital Course:  Patient was initially admitted to the hospital from clinic after finding a hemoglobin of 4.5.  Hemoglobin in ED was 5.0.  Patient received 4 units packed red blood cells in total before stabilizing.  Capsule swallow study was performed and AVM believed to be the cause of bleed were noted.  Patient's Eliquis, which she was on for A. fib, was held permanently due to concern for bleeding.  Patient has history of chronic kidney disease, but has acutely worsened over the past 4 months.  Nephrology was consulted.  Patient tested positive for MPO antibody.  Low complement levels were noted.  SPEP was performed in response to globulin [place], and patient was found to have M spike.  After discussion with primary team, nephrologist, and palliative care, it was decided that the cause of patient's kidney disease would not be worked up,  and the patient would be put on hospice care upon returning home from skilled nursing facility.  Fever: Patient developed intermittent nightly fevers while on antibiotics for UTI.  Fevers would resolve by morning.  Patient remained asymptomatic throughout.  Fevers believed to be caused by a viral infection, in addition to the UTI that was treated with cefepime, followed by azithromycin and cefdinir. Patient was afebrile for over 24 hours prior to discharge.   CHF: Patient was having dyspnea requiring supplemental oxygen.  Lasix were given, but eventually stopped due to worsening creatinine, which was later discovered to be due to an unknown kidney disorder.  Despite being off Lasix patient continued to make urine, and did not require any more supplemental oxygen during her stay.  She continued to have crackles on auscultation, but no complaints of dyspnea, or cough.   Issues for Follow Up:  1. Hospice - patient to transition to hospice care after discharge from SNF.  Palliative medicine is working with patient and will continue outpatient.  2. Renal - patient has worsening renal disease.  Possible vasculitis, possible multiple myeloma.  Patient decided not to work it up any further.  3. GI bleed - patient hgb stabilized ~8.5 s/p 4 U pRBC.  Likely due to AVM.  Consider follow up hgb in 1 week.  4. Anticoagulation - we are holding eliquis indefinitely in setting of multiple GI bleeds.   5. CHF - we have held lasix on admission.  Respiratory status did not  worsen noticeably after discontinuation of lasix.   Significant Procedures: EGD, capsule study.    Significant Labs and Imaging:  Recent Labs  Lab 05/05/18 0528 05/06/18 0538 05/07/18 0636  WBC 4.9 4.2 5.0  HGB 9.1* 8.5* 9.1*  HCT 28.1* 26.8* 28.6*  PLT 180 142* 150   Recent Labs  Lab 05/02/18 0646 05/04/18 0227 05/05/18 0528 05/05/18 1458 05/06/18 0538 05/07/18 0636  NA 132* 129* 129* 130* 131* 131*  K 3.6 3.3* 5.3* 4.1 3.8 4.5   CL 103 102 102 105 108 104  CO2 17* 17* 18* 16* 16* 16*  GLUCOSE 116* 124* 109* 120* 98 101*  BUN 83* 79* 75* 76* 72* 68*  CREATININE 3.85* 3.57* 3.40* 3.30* 3.21* 2.97*  CALCIUM 8.3* 8.1* 8.3* 8.2* 8.0* 8.4*  PHOS 4.7*  --   --   --   --   --       Results/Tests Pending at Time of Discharge: none  Discharge Medications:  Allergies as of 05/08/2018   No Known Allergies     Medication List    STOP taking these medications   acetaminophen 650 MG CR tablet Commonly known as:  TYLENOL   bisacodyl 10 MG suppository Commonly known as:  Dulcolax   Eliquis 2.5 MG Tabs tablet Generic drug:  apixaban   furosemide 40 MG tablet Commonly known as:  LASIX   polyethylene glycol packet Commonly known as:  MiraLax   senna 8.6 MG Tabs tablet Commonly known as:  SENOKOT     TAKE these medications   amLODipine 10 MG tablet Commonly known as:  NORVASC TAKE 1 TABLET BY MOUTH EVERY DAY   azithromycin 500 MG tablet Commonly known as:  ZITHROMAX Take 1 tablet (500 mg total) by mouth daily with supper for 3 days.   carvedilol 12.5 MG tablet Commonly known as:  COREG Take 1 tablet (12.5 mg total) by mouth 2 (two) times daily with a meal. What changed:    medication strength  See the new instructions.   cefdinir 300 MG capsule Commonly known as:  OMNICEF Take 1 capsule (300 mg total) by mouth daily at 6 PM for 3 days.   feeding supplement (ENSURE ENLIVE) Liqd Take 237 mLs by mouth 2 (two) times daily between meals. What changed:  when to take this   ferrous sulfate 325 (65 FE) MG tablet Take 1 tablet (325 mg total) by mouth 2 (two) times daily with a meal.   hydrALAZINE 50 MG tablet Commonly known as:  APRESOLINE Take 1 tablet (50 mg total) by mouth 3 (three) times daily. What changed:    medication strength  how much to take   multivitamin Tabs tablet Take 1 tablet by mouth at bedtime.            Durable Medical Equipment  (From admission, onward)          Start     Ordered   05/07/18 0000  DME Bedside commode    Question:  Patient needs a bedside commode to treat with the following condition  Answer:  Frail elderly   05/07/18 1520   05/07/18 0000  For home use only DME 4 wheeled rolling walker with seat    Question:  Patient needs a walker to treat with the following condition  Answer:  Frail elderly   05/07/18 1520          Discharge Instructions: Please refer to Patient Instructions section of EMR for full details.  Patient was counseled important  signs and symptoms that should prompt return to medical care, changes in medications, dietary instructions, activity restrictions, and follow up appointments.   Follow-Up Appointments: Contact information for after-discharge care    Destination    [facility] Preferred SNF .   Service:  Skilled Nursing Contact information: [address] [place] [phone]              Matilde Haymaker, MD 05/08/2018, 3:07 PM PGY-1, [place]

## 2018-05-08 NOTE — Progress Notes (Signed)
Report called and given to RN at [facility].

## 2018-05-08 NOTE — TOC Progression Note (Signed)
Transition of Care [facility]) - Progression Note    Patient Details  Name: Jennifer Hinton MRN: [id number] Date of Birth: December 13, 1935  Transition of Care [facility]) CM/SW [place], LCSW Phone Number: 05/08/2018, 2:56 PM  Clinical Narrative:    CSW received notice that patient's family would like to take her home with hospice services. CSW contacted patient's daughter, Olivia Mackie, at bedside. She reported that they would prefer to have the patient get rehab at [facility] and see how she does. [facility] alerted that patient will be arriving today.         Expected Discharge Plan and Services           Expected Discharge Date: 05/07/18                         Social Determinants of Health (SDOH) Interventions    Readmission Risk Interventions No flowsheet data found.

## 2018-05-08 NOTE — TOC Transition Note (Signed)
Transition of Care [facility]) - CM/SW Discharge Note   Patient Details  Name: Jennifer Hinton MRN: [id number] Date of Birth: November 07, 1935  Transition of Care [facility]) CM/SW Contact:  Benard Halsted, LCSW Phone Number: 05/08/2018, 3:13 PM   Clinical Narrative:    Patient will DC to: [place] Anticipated DC date: 05/08/18 Family notified: Daughter, Olivia Mackie at bedside Transport by: Rodell Perna   Per MD patient ready for DC to [facility] RN, patient, patient's family, and facility notified of DC. Discharge Summary and FL2 sent to facility. RN to call report prior to discharge [phone]). DC packet on chart. Ambulance transport requested for patient.   CSW will sign off for now as social work intervention is no longer needed. Please consult [place] again if new needs arise.  Cedric Fishman, LCSW Clinical Social Worker [phone]          Patient Goals and CMS Choice        Discharge Placement                       Discharge Plan and Services                          Social Determinants of Health (SDOH) Interventions     Readmission Risk Interventions No flowsheet data found.

## 2018-05-08 NOTE — Progress Notes (Signed)
Family Medicine Teaching Service Daily Progress Note Intern Pager: [phone]  Patient name: Jennifer Hinton Medical record number: [id number] Date of birth: 1935/11/05 Age: 84 y.o. Gender: female  Primary Care Provider: [name], MD Consultants: GI Code Status: Full code  Pt Overview and Major Events to Date:  Hospital Day 15 Admitted: 04/23/2018   Assessment and Plan: Jennifer Hinton a 83 y.o.female whopresented with symptomatic anemia 2/2 GI bleed. PMH is significant forHLD, gout, HTN, afib, diastolic CHF, pHTN, CKD IV.   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  - M spike on SPEP, positive MPO ab, low C3,C4.  Pt does not want further workup nor pursue dialysis.  Will manage medically.  Cr improving.   - holding furosemide   - MonitorBMP - avoid nephrotoxic medications - awaiting SNF placement - f/u palliative care recs re: hospice house vs home hospice.   Fever - pt has been having nightly fevers without symptoms or leukocytosis.  It is most likely due to either her multiple myeloma or her vasculitis.  Highly doubt infection or poorly treated UTI given lack of any other signs/symptoms outside of fever.   UTI -  azithro/cefdinir started for UTI.  urinalysis/urine culture obtained for fever. >100k e.coli.  Started on cefepime, switched to azithro/cefdinir. MRSA swab neg.  - start azithro/cefdinir - monitor fevers.   Symptomatic anemia secondary to GI bleed. - resolved bleed.    Stable around 8-8.5 g/dl. Has received total of 4 U RBC this hospital stay. Based on capsule study results on 04/28/2018, GI believes bleeding due to AVMs. Recommending iron and erythropoietin if blood loss stable. Continue to hold eliquis indefinitely. SCDs on for ppx currently.   - continue to monitor Hgb on daily CBC  - PT/OT eval and treat - recommending SNF and rollating walker, 3-in-1 commode   Hypertension - Normotensive,  Continuing home meds, adjust as needed.   - amlodipine 10  mg - carvedilol 12.5 mg BID -hydralazine 50 mg TID -hydralazine 72m Q4 hours PRN for SBP>180 and DBP>110  HFpEF exacerbation 2/2 fluid overload from multiple transfusions. Stable volume status without evidence of overload this morning.  Pt satting well on room air. No recorded output on 3/16.       - cont to hold Lasix in setting of worsening SCr  - Continue amlodipine 166mqd, coreg 12.61m91mID  - hydralazine 50 mg TID,  - nephrology following, appreciate recommendations   A. fib - Holding Eliquis in the setting of GI bleed. Do not continue on discharge.  - continue carvedilol 25 mg BID   Protein calorie malnutrition -Nutrition consulted -cont Ensure supplements TID   FEN/GI: heart healthy  PPx: SCDs (holding eliquis)   Disposition: pending clinical improvement, palliative meeting with family for GOCGrayscussion   Medications: Scheduled Meds: . amLODipine  10 mg Oral Daily  . azithromycin  500 mg Oral Q supper  . carvedilol  12.5 mg Oral BID WC  . cefdinir  300 mg Oral q1800  . feeding supplement  1 Container Oral TID BM  . hydrALAZINE  50 mg Oral TID  . Melatonin  3 mg Oral QHS  . multivitamin  1 tablet Oral QHS   Continuous Infusions:  PRN Meds: acetaminophen **OR** acetaminophen, hydrALAZINE  Subjective:  Pt states she is cold.  No other complaints.  Spoke with patients daughter regarding disposition plans.    Objective: Temp:  [98.1 F (36.7 C)-101.3 F (38.5 C)] 98.8 F (37.1 C) (03/20 0428) Pulse Rate:  [  74-88] 88 (03/20 0428) Resp:  [19-20] 19 (03/20 0428) BP: (125-146)/(64-86) 139/72 (03/20 0428) SpO2:  [95 %-97 %] 95 % (03/20 0428) Weight:  [65.1 kg] 65.1 kg (03/19 1738) Intake/Output 03/19 0701 - 03/20 0700 In: 180 [P.O.:180] Out: 1100 [Urine:1100]   Physical Exam:  Gen: sitting up in bed.  Leaning to her left side.  Eyes closed.   CV: irregularly irregular rhythm.  2+ pulses.   No edema.   Resp: Normal work of breathing on room air. Crackles  on  inspiration, L>R.  No acute distress.   Abd: soft, nontender. Normal bowel sounds.  Psych: Normal mood and pleasant affect.    Laboratory: Recent Labs  Lab 05/05/18 0528 05/06/18 0538 05/07/18 0636  WBC 4.9 4.2 5.0  HGB 9.1* 8.5* 9.1*  HCT 28.1* 26.8* 28.6*  PLT 180 142* 150   Recent Labs  Lab 05/05/18 1458 05/06/18 0538 05/07/18 0636  NA 130* 131* 131*  K 4.1 3.8 4.5  CL 105 108 104  CO2 16* 16* 16*  BUN 76* 72* 68*  CREATININE 3.30* 3.21* 2.97*  CALCIUM 8.2* 8.0* 8.4*  GLUCOSE 120* 98 101*   Imaging/Diagnostic Tests: Dg Chest 2 View  Result Date: 05/06/2018 CLINICAL DATA:  Fever EXAM: CHEST - 2 VIEW COMPARISON:  05/03/2018, 04/23/2018 FINDINGS: Marked cardiomegaly with central vascular congestion and mild interstitial edema. Small pleural effusions with airspace disease at the left base. Aortic atherosclerosis. No pneumothorax. IMPRESSION: 1. Marked cardiomegaly with vascular congestion and pulmonary edema. 2. Small pleural effusions. Airspace disease at the left base which may reflect atelectasis, pneumonia or aspiration. Electronically Signed   By: Donavan Foil M.D.   On: 05/06/2018 15:14   Benay Pike, MD 05/08/2018, 6:19 AM PGY-1, [place] Intern pager: [phone], text pages welcome

## 2018-05-08 NOTE — Progress Notes (Signed)
PT Cancellation Note  Patient Details Name: Jennifer Hinton MRN: [id number] DOB: Jan 29, 1936   Cancelled Treatment:    Reason Eval/Treat Not Completed: Patient declined, no reason specified made multiple attempts to work with patient, on first 2 attempts palliative medicine staff was working with patient, on third attempt patient refused to participate.    Deniece Ree PT, DPT, CBIS  Supplemental Physical Therapist [facility]    Pager [phone] Acute Rehab Office [phone]

## 2018-05-08 NOTE — Progress Notes (Signed)
Daily Progress Note   Patient Name: Jennifer Hinton       Date: 05/08/2018 DOB: 1935/09/20  Age: 83 y.o. MRN#: [id number] Attending Physician: Zenia Resides, MD Primary Care Physician: [name], MD Admit Date: 04/23/2018  Reason for Consultation/Follow-up: Establishing goals of care and Psychosocial/spiritual support  Subjective: Chart reviewed.  Patient stabilized after 4 unit transfusion of PRBCs.  Now with CKD stg 4. Patient resting comfortably.  Dtr Kennyth Lose at bedside.  We called Olivia Mackie on the phone. I explained that Jennifer Hinton is eligible for Hospice Services in the home.  We talked about what those services may include.  Olivia Mackie asked when her mother was ready for discharge.  I responded that she is currently ready for discharge and would probably be discharged tomorrow.     Assessment: Patient stabilized after acute blood loss anemia and acute on chronic kidney disease.  Now CKD 4.  Does not want dialysis.  Suspected Multiple Myeloma.  Patient does not want to pursue treatment.  Taking in primarily liquids (coke)    Length of Stay: 15  Current Medications: Scheduled Meds:  . amLODipine  10 mg Oral Daily  . azithromycin  500 mg Oral Q supper  . carvedilol  12.5 mg Oral BID WC  . cefdinir  300 mg Oral q1800  . feeding supplement  1 Container Oral TID BM  . hydrALAZINE  50 mg Oral TID  . Melatonin  3 mg Oral QHS  . multivitamin  1 tablet Oral QHS    Continuous Infusions:   PRN Meds: acetaminophen **OR** acetaminophen, hydrALAZINE  Physical Exam        Frail elderly female, does not open eyes, but listens and responds.  Vital Signs: BP (!) 142/67 (BP Location: Right Arm)   Pulse 95   Temp 99.9 F (37.7 C) (Oral)   Resp 20   Ht 5' 4"  (1.626 m)   Wt 65.1 kg    SpO2 94%   BMI 24.64 kg/m  SpO2: SpO2: 94 % O2 Device: O2 Device: Room Air O2 Flow Rate: O2 Flow Rate (L/min): 2 L/min  Intake/output summary:   Intake/Output Summary (Last 24 hours) at 05/08/2018 1420 Last data filed at 05/08/2018 1000 Gross per 24 hour  Intake 300 ml  Output 850 ml  Net -550 ml   LBM: Last  BM Date: 05/06/18 Baseline Weight: Weight: 57.6 kg Most recent weight: Weight: 65.1 kg       Palliative Assessment/Data: 30%    Flowsheet Rows     Most Recent Value  Intake Tab  Referral Department  Hospitalist  Unit at Time of Referral  Med/Surg Unit  Palliative Care Primary Diagnosis  Nephrology  Date Notified  05/01/18  Palliative Care Type  New Palliative care  Reason for referral  Psychosocial or Spiritual support, Clarify Goals of Care  Date of Admission  04/23/18  Date first seen by Palliative Care  05/02/18  # of days Palliative referral response time  1 Day(s)  # of days IP prior to Palliative referral  8  Clinical Assessment  Palliative Performance Scale Score  30%  Pain Max last 24 hours  Not able to report  Pain Min Last 24 hours  Not able to report  Dyspnea Max Last 24 Hours  Not able to report  Dyspnea Min Last 24 hours  Not able to report  Nausea Max Last 24 Hours  Not able to report  Nausea Min Last 24 Hours  Not able to report  Anxiety Max Last 24 Hours  Not able to report  Anxiety Min Last 24 Hours  Not able to report  Other Max Last 24 Hours  Not able to report  Psychosocial & Spiritual Assessment  Palliative Care Outcomes  Patient/Family meeting held?  Yes  Who was at the meeting?  pt, dtrs Kennyth Lose and Hoyle Sauer, granddaughter Camilla  Provided psychosocial or spiritual support  Palliative Care follow-up planned  Yes, Facility      Patient Active Problem List   Diagnosis Date Noted  . Multiple myeloma not having achieved remission ([place])   . Renal failure   . Palliative care by specialist   . Acute GI  bleeding   . Protein-calorie malnutrition, severe 04/27/2018  . Early satiety   . Weight loss, unintentional   . Fatigue associated with anemia 04/23/2018  . Chronic GI bleeding 04/23/2018  . Symptomatic anemia 12/19/2017  . Iron deficiency anemia 12/19/2017  . Weight loss 12/19/2017  . CKD (chronic kidney disease) stage 4, GFR 15-29 ml/min ([facility]) 12/19/2017  . AKI (acute kidney injury) ([place]) 12/17/2017  . A-fib ([place]) 12/11/2017  . Weakness   . Goals of care, counseling/discussion 10/17/2015  . Post-menopause 10/17/2015  . Hemarthrosis 12/19/2014  . Chronic diastolic congestive heart failure ([place]) 10/03/2014  . Essential hypertension, benign 06/14/2014  . Knee pain 06/14/2014  . Hypertensive cardiovascular disease- LVH 06/06/2014  . Pulmonary hypertension-severe 06/06/2014  . Tricuspid regurgitation-severe 06/06/2014  . Malnutrition of moderate degree ([place]) 06/06/2014  . Acute on chronic diastolic congestive heart failure ([place])   . Dyspnea 03/09/2014  . Falls 03/09/2014  . Tinnitus 10/05/2012  . Anticoagulant long-term use 05/23/2010  . LIPOMA 11/30/2008  . PROTEINURIA 11/30/2008  . Gout, unspecified 06/27/2008  . HTN (hypertension) 12/17/2006  . Dyslipidemia 04/17/2006  . OBESITY, NOS 04/17/2006  . Permanent atrial fibrillation 04/17/2006  . Osteoarthrosis, unspecified whether generalized or localized, involving lower leg 04/17/2006    Palliative Care Plan    Recommendations/Plan:  Patient is eligible for hospice in the home should they choose to accept those services.   I understand the patient has now been accepted at [facility] as well.  Family should choose which option is best for Jennifer Hinton.  Code Status:  DNR  Prognosis:   < 3 months given declining renal status, frailty, minimal  PO, minimal mobility, likely multiple myeloma not pursuing treatment.    Discharge Planning:  To Be Determined  Care plan was discussed with family, patient, Case mgr, CSW, attending  physician  Thank you for allowing the Palliative Medicine Team to assist in the care of this patient.  Total time spent:  35 min.     Greater than 50%  of this time was spent counseling and coordinating care related to the above assessment and plan.  Florentina Jenny, PA-C Palliative Medicine  Please contact Palliative MedicineTeam phone at [phone] for questions and concerns between 7 am - 7 pm.   Please see AMION for individual provider pager numbers.

## 2018-05-12 DIAGNOSIS — K922 Gastrointestinal hemorrhage, unspecified: Secondary | ICD-10-CM | POA: Diagnosis not present

## 2018-05-12 DIAGNOSIS — N184 Chronic kidney disease, stage 4 (severe): Secondary | ICD-10-CM | POA: Diagnosis not present

## 2018-05-12 DIAGNOSIS — R5381 Other malaise: Secondary | ICD-10-CM | POA: Diagnosis not present

## 2018-05-12 DIAGNOSIS — D649 Anemia, unspecified: Secondary | ICD-10-CM | POA: Diagnosis not present

## 2018-05-12 DIAGNOSIS — C9000 Multiple myeloma not having achieved remission: Secondary | ICD-10-CM | POA: Diagnosis not present

## 2018-05-14 DIAGNOSIS — R5381 Other malaise: Secondary | ICD-10-CM | POA: Diagnosis not present

## 2018-05-14 DIAGNOSIS — D500 Iron deficiency anemia secondary to blood loss (chronic): Secondary | ICD-10-CM | POA: Diagnosis not present

## 2018-05-14 DIAGNOSIS — I480 Paroxysmal atrial fibrillation: Secondary | ICD-10-CM | POA: Diagnosis not present

## 2018-05-14 DIAGNOSIS — I10 Essential (primary) hypertension: Secondary | ICD-10-CM | POA: Diagnosis not present

## 2018-06-13 ENCOUNTER — Other Ambulatory Visit: Payer: Self-pay | Admitting: Cardiology

## 2018-06-15 NOTE — Telephone Encounter (Signed)
DC summary says hydralazine 50 tid Kirk [facility]s

## 2018-07-20 DEATH — deceased

## 2019-08-12 IMAGING — CT CT ABDOMEN AND PELVIS WITHOUT CONTRAST
2 of 4 series · 16 of 46 positions shown, 18 images · non-contrast
Comparison: CT chest 04/25/2018. Ultrasound kidneys 04/25/2018

CLINICAL DATA: Unintended weight loss. Non localized abdominal
pain.

EXAM:
CT ABDOMEN AND PELVIS WITHOUT CONTRAST
TECHNIQUE: Multidetector CT imaging of the abdomen and pelvis was performed
following the standard protocol without IV contrast.

[Series 3: a/p w/o 5mm · axial · non-contrast · 0.98mm/px · z∈[-332,+88]mm · 13 of 93 slices shown, 15 images]
[im 5/93  soft-tissue]
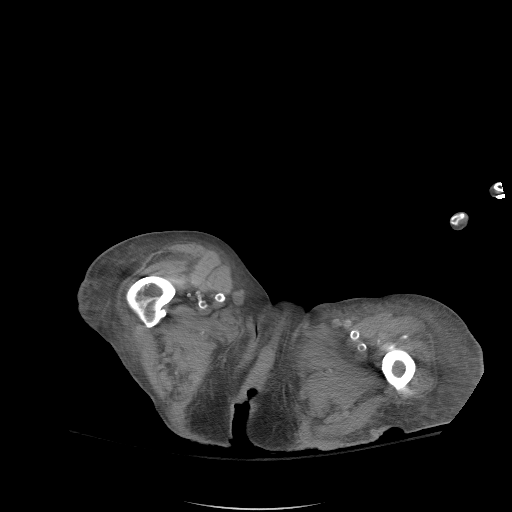
[im 5/93  bone]
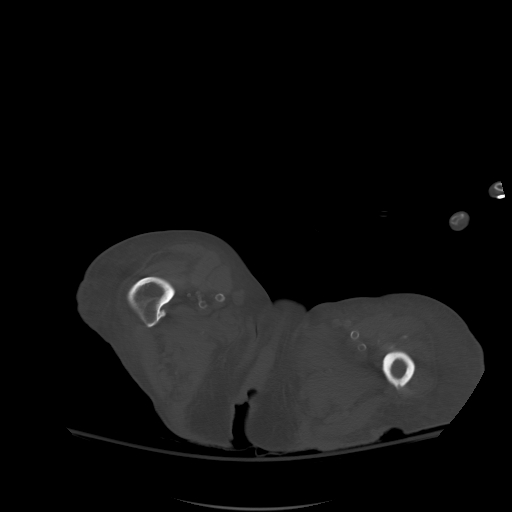
[im 13/93  soft-tissue]
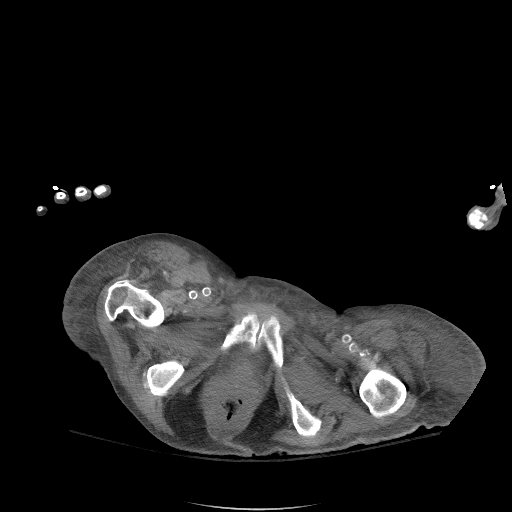
[im 21/93  soft-tissue]
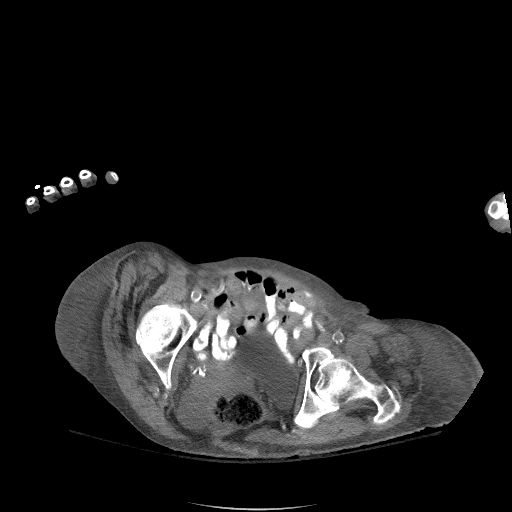
[im 25/93  soft-tissue]
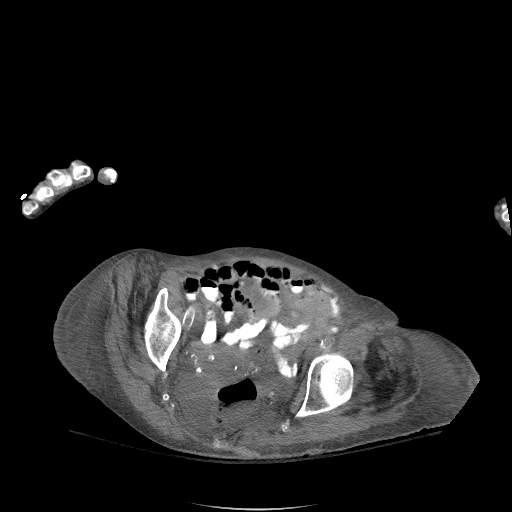
[im 33/93  soft-tissue]
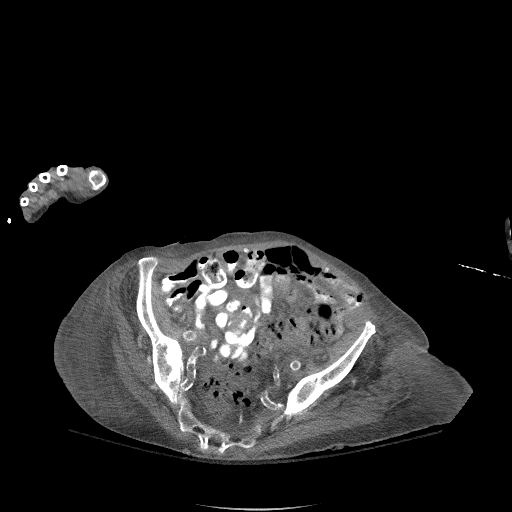
[im 41/93  soft-tissue]
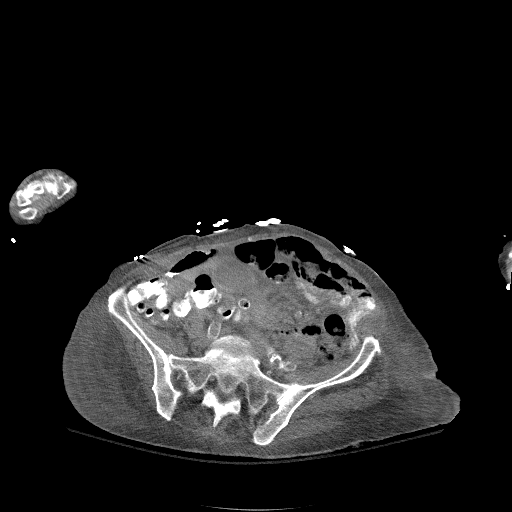
[im 49/93  soft-tissue]
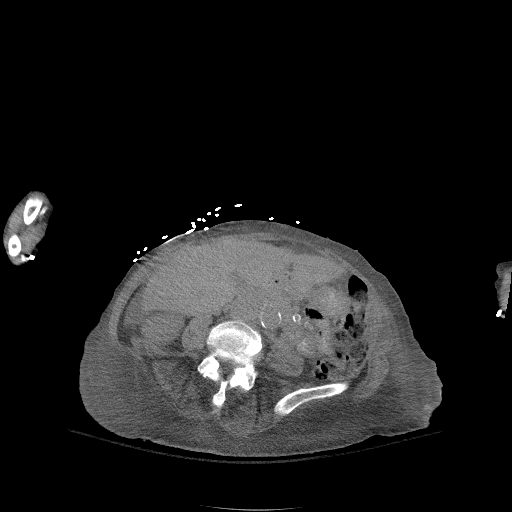
[im 53/93  soft-tissue]
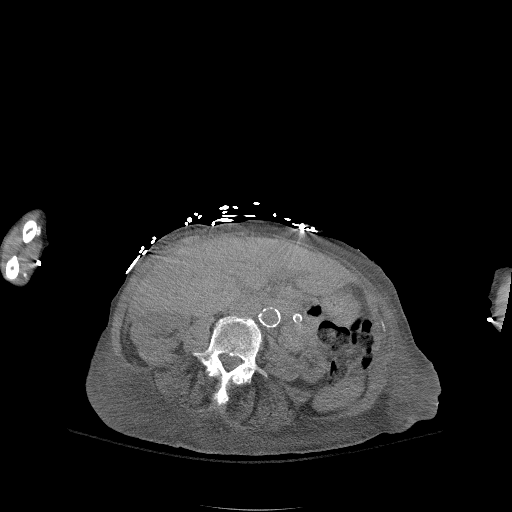
[im 61/93  soft-tissue]
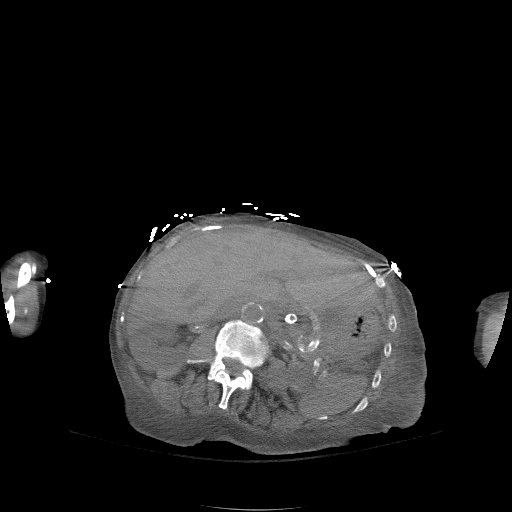
[im 61/93  bone]
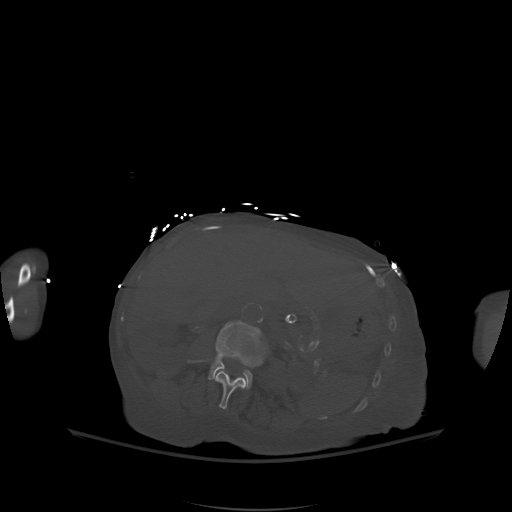
[im 69/93  soft-tissue]
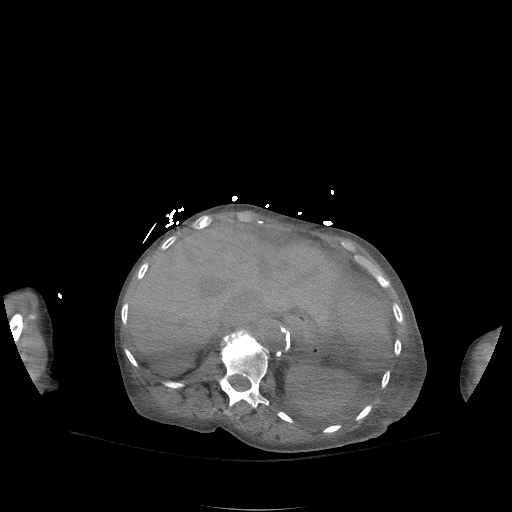
[im 73/93  soft-tissue]
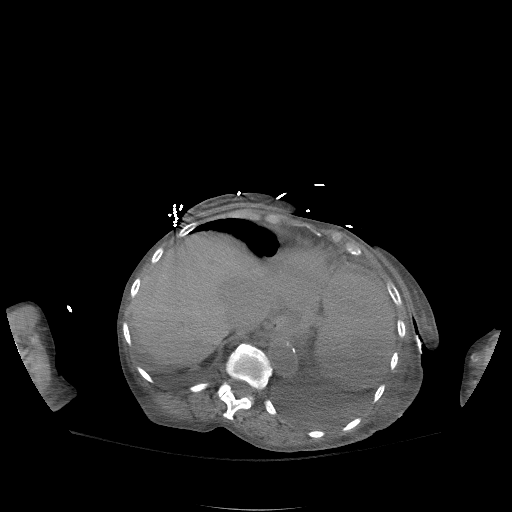
[im 81/93  soft-tissue]
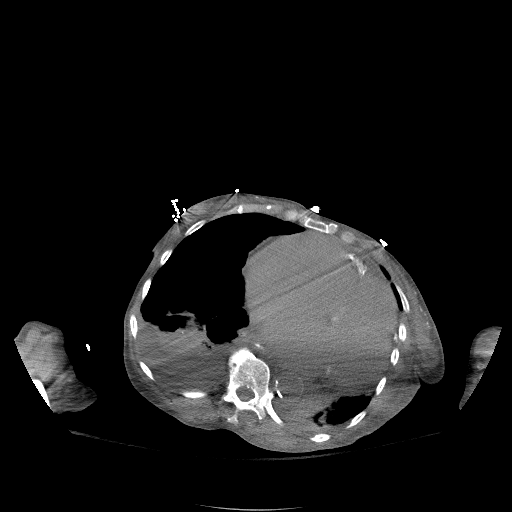
[im 89/93  soft-tissue]
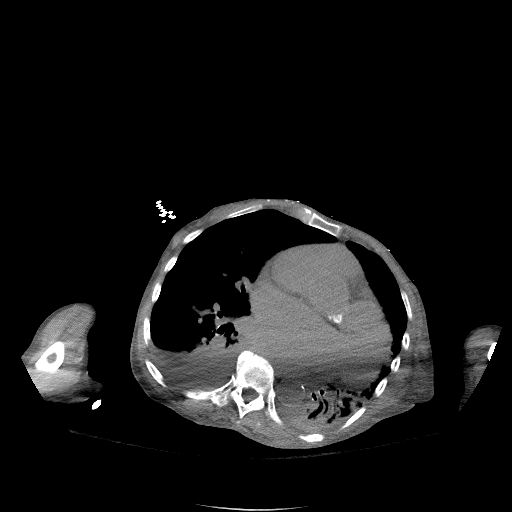

[Series 6: a/p w/o cor · coronal · non-contrast · 0.83mm/px · 3 of 149 slices shown]
[im 50/149  soft-tissue]
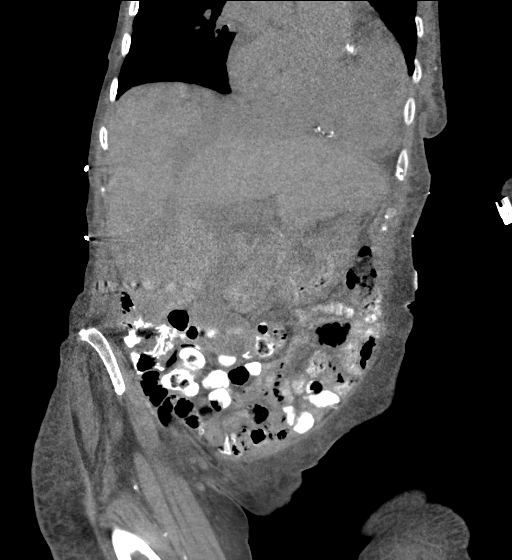
[im 66/149  soft-tissue]
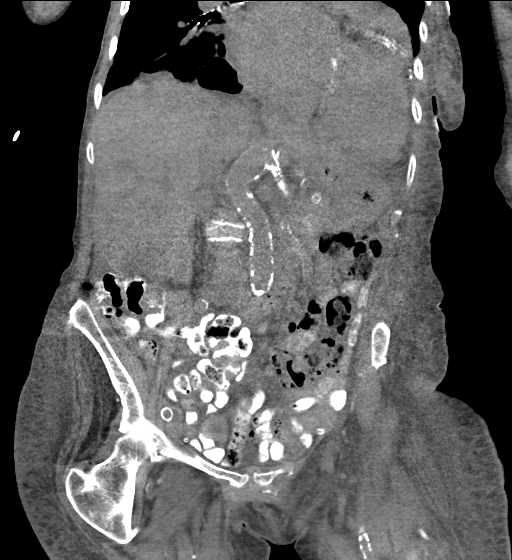
[im 83/149  soft-tissue]
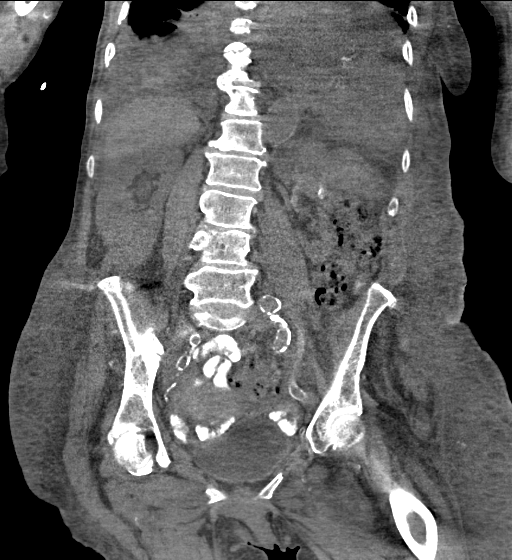

[16 of 46 positions shown; findings below may reference images not displayed]

FINDINGS: Lower chest: Bilateral pleural effusions, greater on the right, with
atelectasis in the lung bases. This is similar to previous study.
Diffuse cardiac enlargement. Coronary artery calcifications.

Hepatobiliary: Unenhanced appearance of the liver, gallbladder, and
bile ducts is unremarkable. No obvious acute abnormality.

Pancreas: Visualized unenhanced pancreas is unremarkable.

Spleen: Unenhanced appearance of the spleen is unremarkable.

Adrenals/Urinary Tract: No adrenal gland nodules. Asymmetric atrophy
of the left kidney. No hydronephrosis or hydroureter. No renal or
ureteral stones. Bladder is unremarkable.

Stomach/Bowel: Stomach, small bowel, and colon are mostly
decompressed. Scattered stool within the colon. No obvious wall
thickening or inflammatory changes. Colonic diverticula are present.
Appendix is not identified.

Vascular/Lymphatic: Prominent calcification of the abdominal aorta
and branch vessels.

Reproductive: No abnormal pelvic masses. Probable calcified fibroid
in the uterus.

Other: There is a moderate amount of free fluid in the abdomen and
pelvis, likely ascites. No free air. Abdominal wall musculature
appears intact. Diffuse edema in the subcutaneous fatty tissues
throughout the abdomen and pelvis.

Musculoskeletal: Degenerative changes in the lumbar spine. No
destructive bone lesions.
IMPRESSION: 1. Bilateral pleural effusions, greater on the right, with basilar
atelectasis.
2. Cardiac enlargement.
3. Moderate abdominal and pelvic ascites. Diffuse soft tissue edema.
4. No evidence of bowel obstruction or inflammation.

## 2019-08-16 IMAGING — DX CHEST - 2 VIEW
2 series · 2 of 2 positions shown · non-contrast
Comparison: Chest radiograph dated 04/23/2018 and chest CT dated
04/25/2018.

CLINICAL DATA: Shortness of breath. Fluid overload.

EXAM:
CHEST - 2 VIEW

[chest lat]
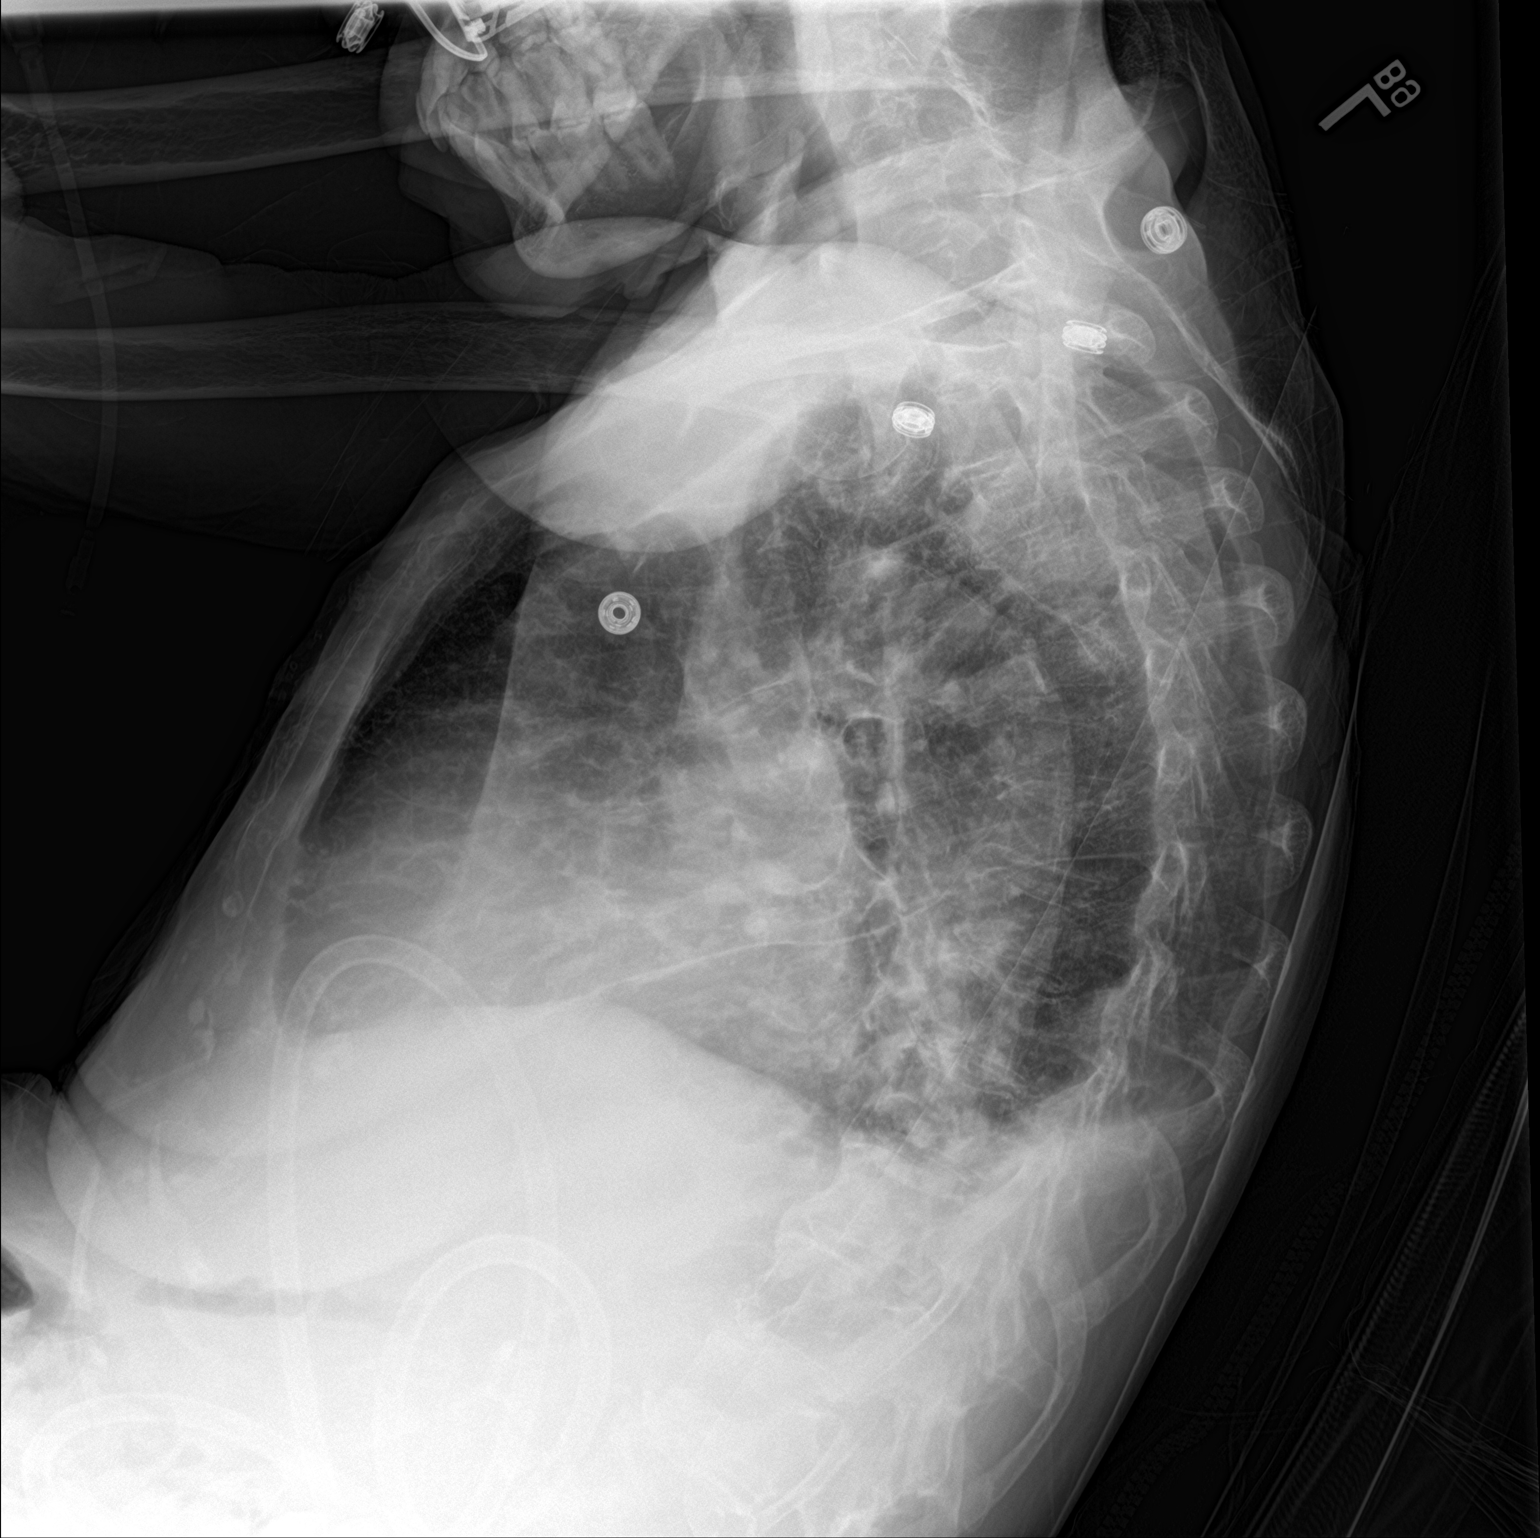

[chest ap]
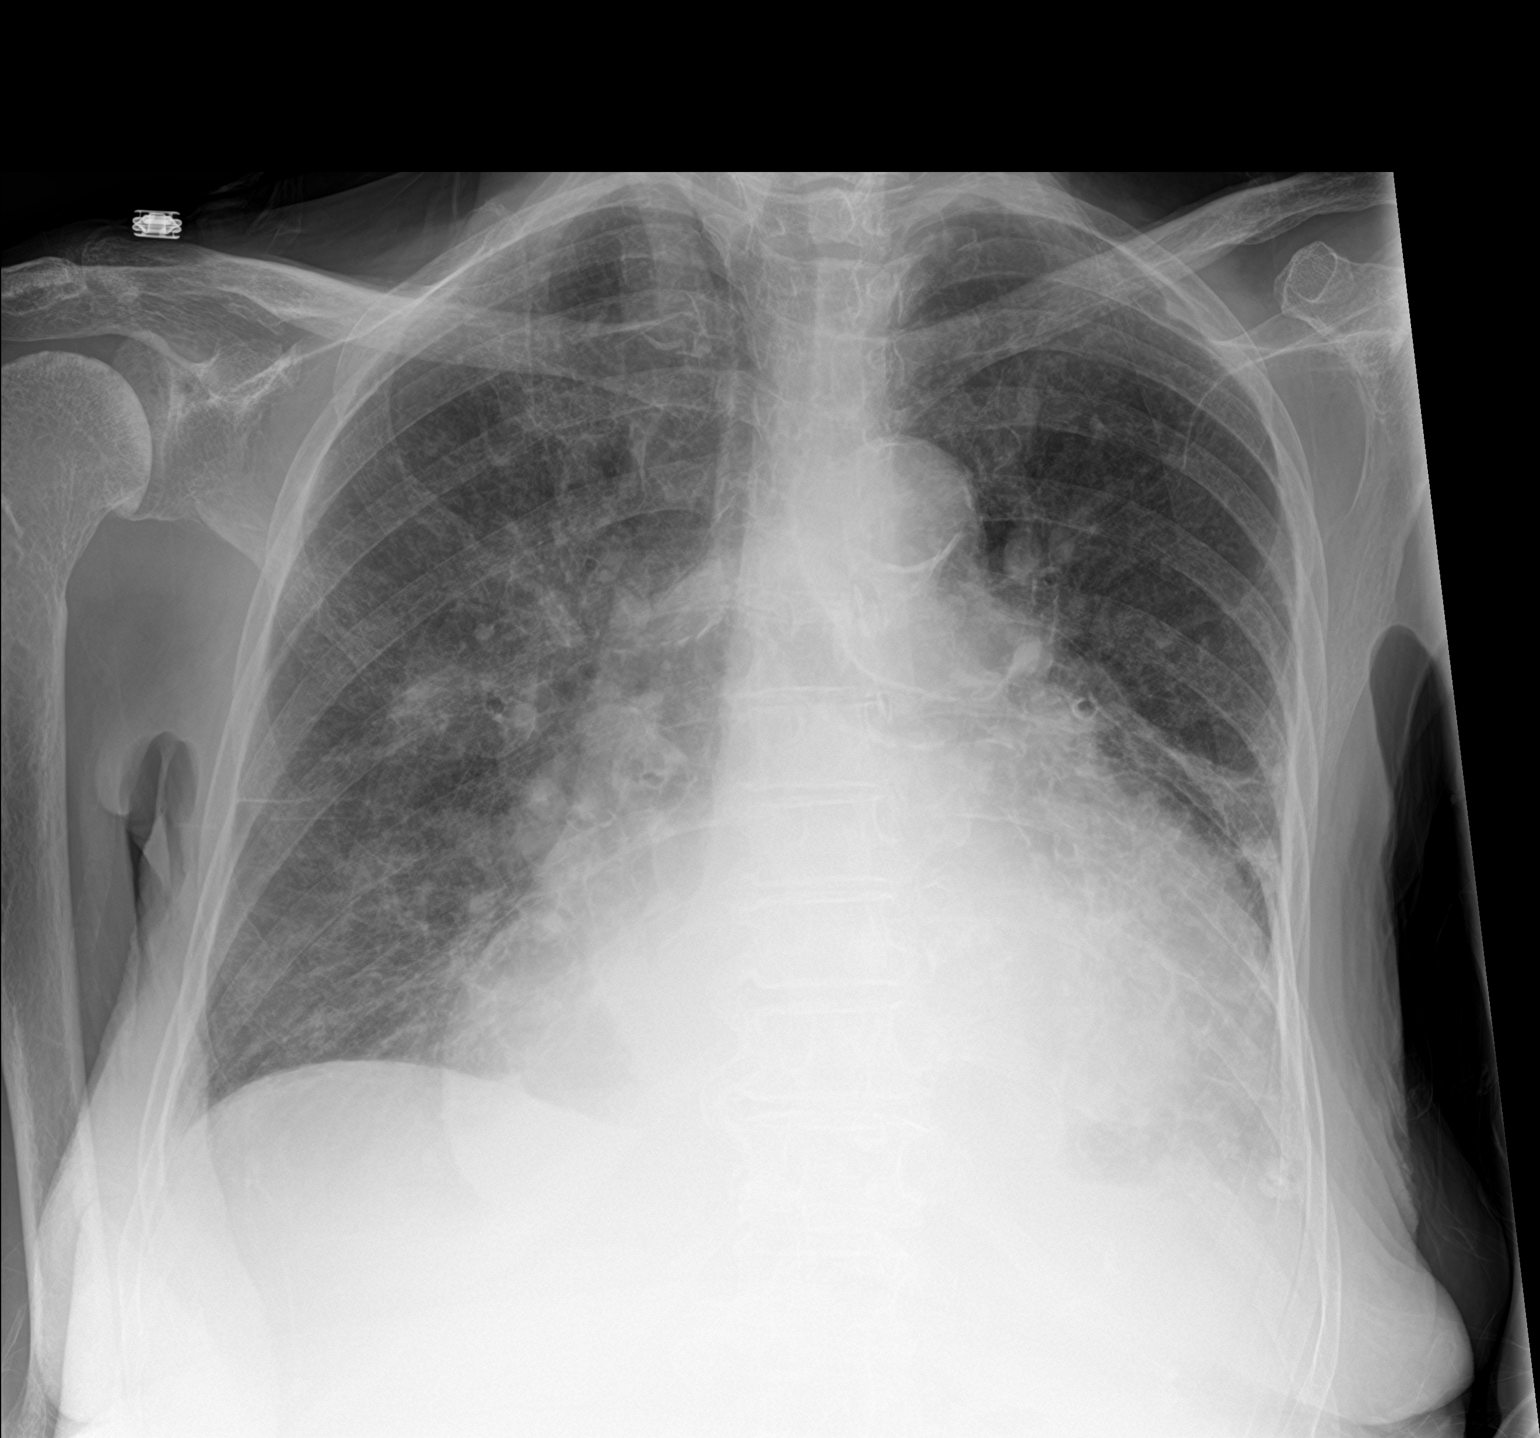

[2 of 2 positions shown; findings below may reference images not displayed]

FINDINGS: Stable enlarged cardiac silhouette. Interval small areas of mild
patchy opacity in both mid lung zones and left lower lobe. Moderate
diffuse peribronchial thickening with mild progression. Small
bilateral pleural effusions. Thoracic spine and right shoulder
degenerative changes. Diffuse osteopenia.
IMPRESSION: 1. Interval mild patchy atelectasis, pneumonia or focal alveolar
edema in both mid lung zones and left lower lobe.
2. Mildly progressive bronchitic changes.
3. Stable cardiomegaly and mild changes of congestive heart failure.
